# Patient Record
Sex: Female | Born: 1960
Health system: Southern US, Community
[De-identification: ages and names within clinical notes are randomized; demographics above are authoritative.]

## PROBLEM LIST (undated history)

## (undated) DIAGNOSIS — J449 Chronic obstructive pulmonary disease, unspecified: Secondary | ICD-10-CM

## (undated) DIAGNOSIS — I10 Essential (primary) hypertension: Secondary | ICD-10-CM

## (undated) DIAGNOSIS — G4730 Sleep apnea, unspecified: Secondary | ICD-10-CM

## (undated) DIAGNOSIS — J45909 Unspecified asthma, uncomplicated: Secondary | ICD-10-CM

## (undated) DIAGNOSIS — M1990 Unspecified osteoarthritis, unspecified site: Secondary | ICD-10-CM

## (undated) HISTORY — PX: OTHER SURGICAL HISTORY: SHX169

## (undated) HISTORY — DX: Unspecified osteoarthritis, unspecified site: M19.90

---

## 2002-10-30 ENCOUNTER — Emergency Department (HOSPITAL_COMMUNITY): Admission: EM | Admit: 2002-10-30 | Discharge: 2002-10-30 | Payer: Self-pay | Admitting: Emergency Medicine

## 2003-06-06 ENCOUNTER — Emergency Department (HOSPITAL_COMMUNITY): Admission: EM | Admit: 2003-06-06 | Discharge: 2003-06-06 | Payer: Self-pay | Admitting: Emergency Medicine

## 2003-08-03 ENCOUNTER — Emergency Department (HOSPITAL_COMMUNITY): Admission: EM | Admit: 2003-08-03 | Discharge: 2003-08-04 | Payer: Self-pay | Admitting: Emergency Medicine

## 2003-12-29 ENCOUNTER — Emergency Department (HOSPITAL_COMMUNITY): Admission: EM | Admit: 2003-12-29 | Discharge: 2003-12-29 | Payer: Self-pay | Admitting: Emergency Medicine

## 2005-11-03 ENCOUNTER — Emergency Department (HOSPITAL_COMMUNITY): Admission: EM | Admit: 2005-11-03 | Discharge: 2005-11-03 | Payer: Self-pay | Admitting: *Deleted

## 2007-01-07 ENCOUNTER — Emergency Department (HOSPITAL_COMMUNITY): Admission: EM | Admit: 2007-01-07 | Discharge: 2007-01-07 | Payer: Self-pay | Admitting: Emergency Medicine

## 2007-01-08 ENCOUNTER — Emergency Department (HOSPITAL_COMMUNITY): Admission: EM | Admit: 2007-01-08 | Discharge: 2007-01-08 | Payer: Self-pay | Admitting: Emergency Medicine

## 2010-04-20 ENCOUNTER — Ambulatory Visit: Payer: Self-pay | Admitting: Family Medicine

## 2010-04-20 ENCOUNTER — Encounter (INDEPENDENT_AMBULATORY_CARE_PROVIDER_SITE_OTHER): Payer: Self-pay | Admitting: Family Medicine

## 2010-04-20 LAB — CONVERTED CEMR LAB
ALT: <8 units/L (ref 0–35)
AST: 12 units/L (ref 0–37)
Albumin: 4.5 g/dL (ref 3.5–5.2)
Alkaline Phosphatase: 50 units/L (ref 39–117)
BUN: 10 mg/dL (ref 6–23)
Basophils Absolute: 0 10*3/uL (ref 0.0–0.1)
Basophils Relative: 1 % (ref 0–1)
CO2: 28 meq/L (ref 19–32)
Calcium: 9 mg/dL (ref 8.4–10.5)
Chloride: 107 meq/L (ref 96–112)
Cholesterol: 187 mg/dL (ref 0–200)
Creatinine, Ser: 0.85 mg/dL (ref 0.40–1.20)
Eosinophils Absolute: 0 10*3/uL (ref 0.0–0.7)
Eosinophils Relative: 1 % (ref 0–5)
Glucose, Bld: 84 mg/dL (ref 70–99)
HCT: 46.5 % — ABNORMAL HIGH (ref 36.0–46.0)
HDL: 57 mg/dL (ref >39)
Helicobacter Pylori Antibody-IgG: 0.7
Hemoglobin: 15.4 g/dL — ABNORMAL HIGH (ref 12.0–15.0)
LDL Cholesterol: 114 mg/dL — ABNORMAL HIGH (ref 0–99)
Lymphocytes Relative: 54 % — ABNORMAL HIGH (ref 12–46)
Lymphs Abs: 2.1 10*3/uL (ref 0.7–4.0)
MCHC: 33.1 g/dL (ref 30.0–36.0)
MCV: 93.6 fL (ref 78.0–100.0)
Monocytes Absolute: 0.4 10*3/uL (ref 0.1–1.0)
Monocytes Relative: 11 % (ref 3–12)
Neutro Abs: 1.3 10*3/uL — ABNORMAL LOW (ref 1.7–7.7)
Neutrophils Relative %: 33 % — ABNORMAL LOW (ref 43–77)
Platelets: 252 10*3/uL (ref 150–400)
Potassium: 3.8 meq/L (ref 3.5–5.3)
RBC: 4.97 M/uL (ref 3.87–5.11)
RDW: 13.6 % (ref 11.5–15.5)
Sodium: 140 meq/L (ref 135–145)
TSH: 0.739 microintl units/mL (ref 0.350–4.500)
Total Bilirubin: 0.4 mg/dL (ref 0.3–1.2)
Total CHOL/HDL Ratio: 3.3
Total Protein: 7.5 g/dL (ref 6.0–8.3)
Triglycerides: 80 mg/dL (ref <150)
VLDL: 16 mg/dL (ref 0–40)
WBC: 3.9 10*3/uL — ABNORMAL LOW (ref 4.0–10.5)

## 2010-05-08 ENCOUNTER — Ambulatory Visit (HOSPITAL_COMMUNITY): Admission: RE | Admit: 2010-05-08 | Discharge: 2010-05-08 | Payer: Self-pay | Admitting: Internal Medicine

## 2010-05-10 ENCOUNTER — Ambulatory Visit (HOSPITAL_COMMUNITY): Admission: RE | Admit: 2010-05-10 | Discharge: 2010-05-10 | Payer: Self-pay | Admitting: Internal Medicine

## 2010-05-20 ENCOUNTER — Encounter: Admission: RE | Admit: 2010-05-20 | Discharge: 2010-05-20 | Payer: Self-pay | Admitting: Internal Medicine

## 2011-06-21 ENCOUNTER — Emergency Department (HOSPITAL_COMMUNITY)
Admission: EM | Admit: 2011-06-21 | Discharge: 2011-06-21 | Discharge status: Against Medical Advice | Payer: Self-pay | Attending: Emergency Medicine | Admitting: Emergency Medicine

## 2011-06-21 ENCOUNTER — Emergency Department (HOSPITAL_COMMUNITY): Payer: Self-pay

## 2011-06-21 DIAGNOSIS — Z0389 Encounter for observation for other suspected diseases and conditions ruled out: Secondary | ICD-10-CM | POA: Insufficient documentation

## 2011-06-27 ENCOUNTER — Emergency Department (HOSPITAL_COMMUNITY)
Admission: EM | Admit: 2011-06-27 | Discharge: 2011-06-27 | Disposition: A | Discharge status: Home / Self Care | Payer: Self-pay | Attending: Emergency Medicine | Admitting: Emergency Medicine

## 2011-06-27 ENCOUNTER — Emergency Department (HOSPITAL_COMMUNITY): Payer: Self-pay

## 2011-06-27 DIAGNOSIS — J40 Bronchitis, not specified as acute or chronic: Secondary | ICD-10-CM | POA: Insufficient documentation

## 2011-06-27 DIAGNOSIS — R079 Chest pain, unspecified: Secondary | ICD-10-CM | POA: Insufficient documentation

## 2011-06-27 DIAGNOSIS — R0602 Shortness of breath: Secondary | ICD-10-CM | POA: Insufficient documentation

## 2011-06-27 DIAGNOSIS — R062 Wheezing: Secondary | ICD-10-CM | POA: Insufficient documentation

## 2011-06-27 DIAGNOSIS — R509 Fever, unspecified: Secondary | ICD-10-CM | POA: Insufficient documentation

## 2011-06-27 LAB — DIFFERENTIAL
Basophils Absolute: 0 10*3/uL (ref 0.0–0.1)
Basophils Relative: 0 % (ref 0–1)
Eosinophils Absolute: 0 10*3/uL (ref 0.0–0.7)
Eosinophils Relative: 0 % (ref 0–5)
Lymphocytes Relative: 17 % (ref 12–46)
Lymphs Abs: 2.1 10*3/uL (ref 0.7–4.0)
Monocytes Absolute: 0.9 10*3/uL (ref 0.1–1.0)
Monocytes Relative: 7 % (ref 3–12)
Neutro Abs: 9.7 10*3/uL — ABNORMAL HIGH (ref 1.7–7.7)
Neutrophils Relative %: 76 % (ref 43–77)

## 2011-06-27 LAB — BASIC METABOLIC PANEL
BUN: 8 mg/dL (ref 6–23)
CO2: 27 mEq/L (ref 19–32)
Calcium: 9.6 mg/dL (ref 8.4–10.5)
Chloride: 100 mEq/L (ref 96–112)
Creatinine, Ser: 0.72 mg/dL (ref 0.50–1.10)
GFR calc Af Amer: >90 mL/min (ref >90)
GFR calc non Af Amer: >90 mL/min (ref >90)
Glucose, Bld: 95 mg/dL (ref 70–99)
Potassium: 3.4 mEq/L — ABNORMAL LOW (ref 3.5–5.1)
Sodium: 136 mEq/L (ref 135–145)

## 2011-06-27 LAB — CBC
HCT: 45.5 % (ref 36.0–46.0)
Hemoglobin: 15.5 g/dL — ABNORMAL HIGH (ref 12.0–15.0)
MCH: 31.1 pg (ref 26.0–34.0)
MCHC: 34.1 g/dL (ref 30.0–36.0)
MCV: 91.4 fL (ref 78.0–100.0)
Platelets: 220 10*3/uL (ref 150–400)
RBC: 4.98 MIL/uL (ref 3.87–5.11)
RDW: 13.4 % (ref 11.5–15.5)
WBC: 12.7 10*3/uL — ABNORMAL HIGH (ref 4.0–10.5)

## 2012-08-30 ENCOUNTER — Emergency Department (HOSPITAL_COMMUNITY)
Admission: EM | Admit: 2012-08-30 | Discharge: 2012-08-30 | Discharge status: Absent without leave | Payer: Self-pay | Source: Home / Self Care

## 2012-09-06 ENCOUNTER — Encounter (HOSPITAL_COMMUNITY): Payer: Self-pay | Admitting: *Deleted

## 2012-09-06 ENCOUNTER — Emergency Department (INDEPENDENT_AMBULATORY_CARE_PROVIDER_SITE_OTHER)
Admission: EM | Admit: 2012-09-06 | Discharge: 2012-09-06 | Disposition: A | Discharge status: Home / Self Care | Payer: Self-pay | Source: Home / Self Care

## 2012-09-06 DIAGNOSIS — I10 Essential (primary) hypertension: Secondary | ICD-10-CM

## 2012-09-06 DIAGNOSIS — Z720 Tobacco use: Secondary | ICD-10-CM

## 2012-09-06 DIAGNOSIS — F17200 Nicotine dependence, unspecified, uncomplicated: Secondary | ICD-10-CM

## 2012-09-06 DIAGNOSIS — J45909 Unspecified asthma, uncomplicated: Secondary | ICD-10-CM

## 2012-09-06 HISTORY — DX: Unspecified asthma, uncomplicated: J45.909

## 2012-09-06 HISTORY — DX: Essential (primary) hypertension: I10

## 2012-09-06 MED ORDER — LISINOPRIL-HYDROCHLOROTHIAZIDE 10-12.5 MG PO TABS: 1.0000 | ORAL_TABLET | Freq: Every day | ORAL | Status: DC

## 2012-09-06 NOTE — ED Provider Notes (Signed)
History     CSN: [id number]  Arrival date & time 09/06/12  1132   First MD Initiated Contact with Patient 09/06/12 1301      Chief Complaint  Patient presents with  . Hypertension    (Consider location/radiation/quality/duration/timing/severity/associated sxs/prior treatment) HPI Comments: 52 year old female with a history of hypertension, known since October of 2013, but has not been taking her medicine. She was a previous patient at [facility] a did not go back because she did not like the doctor. She also has a history of asthma and is using her sister's albuterol. She states that she was selling her blood at the plasma center today and was told that her blood pressure continued to be too high to give blood. Advised her to come to the urgent care. She is asymptomatic in regards to hypertension. She does not have chest pain or shortness of breath. Denies headache or edema. She does have other complaints related to postmenopausal syndrome and GERD.   Past Medical History  Diagnosis Date  . Hypertension   . Asthma     History reviewed. No pertinent past surgical history.  History reviewed. No pertinent family history.  History  Substance Use Topics  . Smoking status: Current Every Day Smoker  . Smokeless tobacco: Not on file  . Alcohol Use: No    OB History    Grav Para Term Preterm Abortions TAB SAB Ect Mult Living                  Review of Systems  Constitutional: Negative for fever, chills and fatigue.  HENT: Negative.   Respiratory: Negative for chest tightness and shortness of breath.   Cardiovascular:       Complains of a sharp chest pain that comes and goes it is located right parasternal rib.  Gastrointestinal: Positive for nausea.       Reflux symptoms  Skin: Negative.   Neurological: Negative.     Allergies  Review of patient's allergies indicates not on file.  Home Medications   Current Outpatient Rx  Name  Route  Sig  Dispense  Refill  .  LISINOPRIL-HYDROCHLOROTHIAZIDE 10-12.5 MG PO TABS   Oral   Take 1 tablet by mouth daily.   30 tablet   0     BP 169/115  Pulse 64  Temp 98.2 F (36.8 C) (Oral)  Resp 20  SpO2 100%  LMP 09/06/2012  Physical Exam  Nursing note and vitals reviewed. Constitutional: She is oriented to person, place, and time. She appears well-developed and well-nourished. No distress.  HENT:       Bilateral TMs are normal Oropharynx is clear without exudate  Neck: Normal range of motion. Neck supple.  Cardiovascular: Normal rate, regular rhythm and normal heart sounds.   Pulmonary/Chest: Effort normal.       Faint and expiratory wheezes  Abdominal: Soft. There is no tenderness.  Musculoskeletal: Normal range of motion. She exhibits no edema and no tenderness.  Lymphadenopathy:    She has no cervical adenopathy.  Neurological: She is alert and oriented to person, place, and time. She exhibits normal muscle tone.  Skin: Skin is warm and dry. No rash noted.  Psychiatric: She has a normal mood and affect.    ED Course  Procedures (including critical care time)  Labs Reviewed - No data to display No results found.   1. HTN (hypertension)   2. Asthma   3. Tobacco abuse disorder       MDM  We did discuss the importance for her to have a primary care provider. She was given the number for the adult center here. She is in the process of obtaining her orange card. She has several nonurgent issues she needs to discuss with her physician. Although she is asymptomatic in regards to her blood pressure did start a low dose of Zestoretic 10/12.5 prior to her seeing her physician. She is instructed not to smoke and to use her albuterol every 4 hours when necessary Her chest discomfort is chest wall pain specifically costochondritis. She may place ice over the area and take Tylenol as needed.          Hayden Rasmussen, NP 09/06/12 1410

## 2012-09-06 NOTE — ED Notes (Signed)
Pt  Was  Told  Her  bp  Was  High  At  [facility]        She  Has  History  Of  htn             And  Asthma           She takes  No  meds  And  Has no  Doctor   She     Continues  To   Smoke                       She ambulated to room  With a  Steady   Fluid gait         She  Appears  In no  Distress  Talking on  Her  Cell phone

## 2012-09-06 NOTE — ED Notes (Signed)
Pt called in all waiting areas; no answer x 1 

## 2012-09-11 NOTE — ED Provider Notes (Signed)
Medical screening examination/treatment/procedure(s) were performed by resident physician or non-physician practitioner and as supervising physician I was immediately available for consultation/collaboration.   Barkley Bruns MD.    Linna Hoff, MD 09/11/12 [phone]

## 2012-10-19 ENCOUNTER — Emergency Department (HOSPITAL_COMMUNITY)
Admission: EM | Admit: 2012-10-19 | Discharge: 2012-10-19 | Disposition: A | Discharge status: Home / Self Care | Payer: No Typology Code available for payment source | Source: Home / Self Care

## 2012-10-19 ENCOUNTER — Encounter (HOSPITAL_COMMUNITY): Payer: Self-pay | Admitting: *Deleted

## 2012-10-19 DIAGNOSIS — K219 Gastro-esophageal reflux disease without esophagitis: Secondary | ICD-10-CM

## 2012-10-19 DIAGNOSIS — F17200 Nicotine dependence, unspecified, uncomplicated: Secondary | ICD-10-CM

## 2012-10-19 DIAGNOSIS — I10 Essential (primary) hypertension: Secondary | ICD-10-CM

## 2012-10-19 DIAGNOSIS — Z720 Tobacco use: Secondary | ICD-10-CM

## 2012-10-19 MED ORDER — OMEPRAZOLE 20 MG PO CPDR: 20.0000 mg | DELAYED_RELEASE_CAPSULE | Freq: Every day | ORAL | Status: DC

## 2012-10-19 MED ORDER — LISINOPRIL-HYDROCHLOROTHIAZIDE 10-12.5 MG PO TABS: 1.0000 | ORAL_TABLET | Freq: Every day | ORAL | Status: DC

## 2012-10-19 NOTE — ED Provider Notes (Signed)
History     CSN: [id number]  Arrival date & time 10/19/12  1043   First MD Initiated Contact with Patient 10/19/12 1106      Chief Complaint  Patient presents with  . Follow-up    (Consider location/radiation/quality/duration/timing/severity/associated sxs/prior treatment) HPI 52 year old female with history of hypertension, GERD, ?asthma who was seen in the urgent care for elevated blood pressure one month back and was prescribed lisinopril-HCTZ ( 10-12.5mg ), returns for medication refill. She has run out of her blood pressure medication for past one week. She continued to smoke 2-3 cigarettes every day. He does have some epigastric discomfort with acid reflux symptoms and did have relief with Prilosec OTC and prescribed previously. She denies any chest pain, palpitations, headache, dizziness, blurry vision, shortness of breath, wheezing,  nausea, vomiting, bowel or urinary symptoms. Denies any change in weight or appetite.  Past Medical History  Diagnosis Date  . Hypertension   . Asthma     History reviewed. No pertinent past surgical history.  Family History  Problem Relation Age of Onset  . Hypertension Father   . Cancer Other   . Diabetes Other   . Hyperlipidemia Other   . Stroke Other     History  Substance Use Topics  . Smoking status: Current Every Day Smoker -- 0.50 packs/day    Types: Cigarettes  . Smokeless tobacco: Not on file  . Alcohol Use: No    OB History   Grav Para Term Preterm Abortions TAB SAB Ect Mult Living   2         2      Review of Systems  Allergies  Percocet  Home Medications   Current Outpatient Rx  Name  Route  Sig  Dispense  Refill  . lisinopril-hydrochlorothiazide (ZESTORETIC) 10-12.5 MG per tablet   Oral   Take 1 tablet by mouth daily.   30 tablet   0     BP 148/90  Pulse 74  Temp(Src) 98.3 F (36.8 C) (Oral)  Resp 18  SpO2 100%  LMP 08/18/2012  Physical Exam Middle-aged female in no acute distress HEENT: No  pallor, moist oral mucosa Chest: Clear to auscultation bilaterally no added sounds abdomen: Soft, nontender, nondistended, bowel sounds present CVS: Normal S1 and S2, no murmurs rub or gallop Abdomen: Soft, nontender, nondistended, bowel sounds present Extremities: Warm, no edema CNS: AAO x3  ED Course  Procedures (including critical care time)  Labs Reviewed - No data to display No results found.   No diagnosis found.  Assessment/plan  Hypertension Patient has not taken her blood pressure medication for one week as she ran out of it. I will refill her prescription. Counseled her to adhere to diet low in sodium content and encouraged to quitting smoking. Also counseled her on medication adherence  GERD Will prescribe her with course of Prilosec 20 mg daily  MDM  Follow up in 3 months. Ordered for BMET,  hemoglobin A1c and lipid panel prior to the visit         Eddie North, MD 10/19/12 1147

## 2012-10-19 NOTE — ED Notes (Signed)
Pt here to establish care and follow up with htn - has been told that bp is high by [facility] when trying to donate plasma - states that she had tension headaches in back of neck - no other symptoms

## 2013-02-28 ENCOUNTER — Ambulatory Visit: Payer: No Typology Code available for payment source

## 2013-03-08 ENCOUNTER — Emergency Department (HOSPITAL_COMMUNITY): Payer: No Typology Code available for payment source

## 2013-03-08 ENCOUNTER — Encounter (HOSPITAL_COMMUNITY): Payer: Self-pay | Admitting: *Deleted

## 2013-03-08 ENCOUNTER — Emergency Department (HOSPITAL_COMMUNITY)
Admission: EM | Admit: 2013-03-08 | Discharge: 2013-03-08 | Disposition: A | Discharge status: Home / Self Care | Payer: No Typology Code available for payment source | Attending: Emergency Medicine | Admitting: Emergency Medicine

## 2013-03-08 DIAGNOSIS — R059 Cough, unspecified: Secondary | ICD-10-CM | POA: Insufficient documentation

## 2013-03-08 DIAGNOSIS — I10 Essential (primary) hypertension: Secondary | ICD-10-CM | POA: Insufficient documentation

## 2013-03-08 DIAGNOSIS — Z79899 Other long term (current) drug therapy: Secondary | ICD-10-CM | POA: Insufficient documentation

## 2013-03-08 DIAGNOSIS — F17200 Nicotine dependence, unspecified, uncomplicated: Secondary | ICD-10-CM | POA: Insufficient documentation

## 2013-03-08 DIAGNOSIS — J45901 Unspecified asthma with (acute) exacerbation: Secondary | ICD-10-CM | POA: Insufficient documentation

## 2013-03-08 DIAGNOSIS — M7989 Other specified soft tissue disorders: Secondary | ICD-10-CM | POA: Insufficient documentation

## 2013-03-08 DIAGNOSIS — R05 Cough: Secondary | ICD-10-CM | POA: Insufficient documentation

## 2013-03-08 DIAGNOSIS — J40 Bronchitis, not specified as acute or chronic: Secondary | ICD-10-CM

## 2013-03-08 LAB — POCT I-STAT, CHEM 8
BUN: 11 mg/dL (ref 6–23)
Calcium, Ion: 1.14 mmol/L (ref 1.12–1.23)
Chloride: 104 mEq/L (ref 96–112)
Creatinine, Ser: 1 mg/dL (ref 0.50–1.10)
Glucose, Bld: 93 mg/dL (ref 70–99)
HCT: 44 % (ref 36.0–46.0)
Hemoglobin: 15 g/dL (ref 12.0–15.0)
Potassium: 4.3 mEq/L (ref 3.5–5.1)
Sodium: 145 mEq/L (ref 135–145)
TCO2: 28 mmol/L (ref 0–100)

## 2013-03-08 LAB — CBC WITH DIFFERENTIAL/PLATELET
Basophils Absolute: 0 10*3/uL (ref 0.0–0.1)
Basophils Relative: 0 % (ref 0–1)
Eosinophils Absolute: 0.1 10*3/uL (ref 0.0–0.7)
Eosinophils Relative: 2 % (ref 0–5)
HCT: 44.2 % (ref 36.0–46.0)
Hemoglobin: 14.5 g/dL (ref 12.0–15.0)
Lymphocytes Relative: 55 % — ABNORMAL HIGH (ref 12–46)
Lymphs Abs: 2.8 10*3/uL (ref 0.7–4.0)
MCH: 30.7 pg (ref 26.0–34.0)
MCHC: 32.8 g/dL (ref 30.0–36.0)
MCV: 93.6 fL (ref 78.0–100.0)
Monocytes Absolute: 0.5 10*3/uL (ref 0.1–1.0)
Monocytes Relative: 10 % (ref 3–12)
Neutro Abs: 1.7 10*3/uL (ref 1.7–7.7)
Neutrophils Relative %: 33 % — ABNORMAL LOW (ref 43–77)
Platelets: 292 10*3/uL (ref 150–400)
RBC: 4.72 MIL/uL (ref 3.87–5.11)
RDW: 13.2 % (ref 11.5–15.5)
WBC: 5.1 10*3/uL (ref 4.0–10.5)

## 2013-03-08 LAB — POCT I-STAT TROPONIN I: Troponin i, poc: 0 ng/mL (ref 0.00–0.08)

## 2013-03-08 MED ORDER — IPRATROPIUM BROMIDE 0.02 % IN SOLN
0.5000 mg | Freq: Once | RESPIRATORY_TRACT | Status: AC
  Administered 2013-03-08: 0.5 mg via RESPIRATORY_TRACT
  Filled 2013-03-08: qty 2.5

## 2013-03-08 MED ORDER — PREDNISONE 10 MG PO TABS: ORAL_TABLET | ORAL | Status: DC

## 2013-03-08 MED ORDER — PREDNISONE 20 MG PO TABS
60.0000 mg | ORAL_TABLET | Freq: Once | ORAL | Status: AC
  Administered 2013-03-08: 60 mg via ORAL
  Filled 2013-03-08: qty 3

## 2013-03-08 MED ORDER — ALBUTEROL SULFATE (5 MG/ML) 0.5% IN NEBU
5.0000 mg | INHALATION_SOLUTION | Freq: Once | RESPIRATORY_TRACT | Status: AC
  Administered 2013-03-08: 5 mg via RESPIRATORY_TRACT
  Filled 2013-03-08: qty 1

## 2013-03-08 MED ORDER — ALBUTEROL SULFATE HFA 108 (90 BASE) MCG/ACT IN AERS
2.0000 | INHALATION_SPRAY | Freq: Once | RESPIRATORY_TRACT | Status: AC
  Administered 2013-03-08: 2 via RESPIRATORY_TRACT
  Filled 2013-03-08: qty 6.7

## 2013-03-08 MED ORDER — BENZONATATE 100 MG PO CAPS: 100.0000 mg | ORAL_CAPSULE | Freq: Three times a day (TID) | ORAL | Status: DC

## 2013-03-08 NOTE — ED Notes (Signed)
Pt states yesterday started having shortness of breath, states at times she feels like her throat is closing up and she can't catch her breath, pt states she has run out of her inhaler, pt states "I've been told I have asthma but I dunno what I have".

## 2013-03-08 NOTE — ED Provider Notes (Signed)
History  This chart was scribed for Jaynie Crumble, PA-C working with Juliet Rude. Rubin Payor, MD by Greggory Stallion, ED scribe. This patient was seen in room WTR9/WTR9 and the patient's care was started at 8:45 PM.  CSN: [id number] Arrival date & time 03/08/13  2021   Chief Complaint  Patient presents with  . Shortness of Breath  . Cough  . Asthma   The history is provided by the patient. No language interpreter was used.    HPI Comments: Amber Shaw is a 52 y.o. female who presents to the Emergency Department complaining of sudden onset, constant SOB with associated cough that started yesterday. Pt states she normally uses her inhaler when this happens but she doesn't have one right now because she ran out. Pt states walking makes it worse. She states she may have some mild leg swelling. Pt denies any other associated symptoms. Pt states she smokes a few cigarettes every now and then.   Past Medical History  Diagnosis Date  . Hypertension   . Asthma    History reviewed. No pertinent past surgical history. Family History  Problem Relation Age of Onset  . Hypertension Father   . Cancer Other   . Diabetes Other   . Hyperlipidemia Other   . Stroke Other    History  Substance Use Topics  . Smoking status: Current Every Day Smoker -- 0.50 packs/day    Types: Cigarettes  . Smokeless tobacco: Not on file  . Alcohol Use: No   OB History   Grav Para Term Preterm Abortions TAB SAB Ect Mult Living   2         2     Review of Systems  Cardiovascular: Positive for leg swelling.  All other systems reviewed and are negative.    Allergies  Percocet  Home Medications   Current Outpatient Rx  Name  Route  Sig  Dispense  Refill  . lisinopril-hydrochlorothiazide (ZESTORETIC) 10-12.5 MG per tablet   Oral   Take 1 tablet by mouth daily.   30 tablet   3   . omeprazole (PRILOSEC) 20 MG capsule   Oral   Take 1 capsule (20 mg total) by mouth daily.   30 capsule   0    BP  123/81  Pulse 80  Temp(Src) 98.1 F (36.7 C) (Oral)  Resp 20  Ht 5\' 5"  (1.651 m)  Wt 167 lb (75.751 kg)  BMI 27.79 kg/m2  SpO2 97%  LMP 01/06/2013  Physical Exam  Nursing note and vitals reviewed. Constitutional: She is oriented to person, place, and time. She appears well-developed and well-nourished. No distress.  Speaking full sentances.   HENT:  Head: Normocephalic and atraumatic.  Eyes: Conjunctivae and EOM are normal.  Neck: Normal range of motion. Neck supple. No tracheal deviation present.  Cardiovascular: Normal rate, regular rhythm and normal heart sounds.   No murmur heard. Radial pulses intact.  Pulmonary/Chest: Effort normal. No respiratory distress.  Decreased air movement bilaterally. Bilateral expiratory wheezes.   Musculoskeletal: Normal range of motion.  Trace lower extremitiy edema.  Neurological: She is alert and oriented to person, place, and time.  Skin: Skin is warm and dry.  Psychiatric: She has a normal mood and affect. Her behavior is normal.    ED Course  Procedures (including critical care time)  DIAGNOSTIC STUDIES: Oxygen Saturation is 97% on RA, normal by my interpretation.    COORDINATION OF CARE: 8:49 PM-Discussed treatment plan which includes breathing treatment and chest xray with  pt at bedside and pt agreed to plan.   Results for orders placed during the hospital encounter of 03/08/13  CBC WITH DIFFERENTIAL      Result Value Range   WBC 5.1  4.0 - 10.5 K/uL   RBC 4.72  3.87 - 5.11 MIL/uL   Hemoglobin 14.5  12.0 - 15.0 g/dL   HCT 40.9  81.1 - 91.4 %   MCV 93.6  78.0 - 100.0 fL   MCH 30.7  26.0 - 34.0 pg   MCHC 32.8  30.0 - 36.0 g/dL   RDW 78.2  95.6 - 21.3 %   Platelets 292  150 - 400 K/uL   Neutrophils Relative % 33 (*) 43 - 77 %   Neutro Abs 1.7  1.7 - 7.7 K/uL   Lymphocytes Relative 55 (*) 12 - 46 %   Lymphs Abs 2.8  0.7 - 4.0 K/uL   Monocytes Relative 10  3 - 12 %   Monocytes Absolute 0.5  0.1 - 1.0 K/uL   Eosinophils  Relative 2  0 - 5 %   Eosinophils Absolute 0.1  0.0 - 0.7 K/uL   Basophils Relative 0  0 - 1 %   Basophils Absolute 0.0  0.0 - 0.1 K/uL  POCT I-STAT, CHEM 8      Result Value Range   Sodium 145  135 - 145 mEq/L   Potassium 4.3  3.5 - 5.1 mEq/L   Chloride 104  96 - 112 mEq/L   BUN 11  6 - 23 mg/dL   Creatinine, Ser 0.86  0.50 - 1.10 mg/dL   Glucose, Bld 93  70 - 99 mg/dL   Calcium, Ion 5.78  4.69 - 1.23 mmol/L   TCO2 28  0 - 100 mmol/L   Hemoglobin 15.0  12.0 - 15.0 g/dL   HCT 62.9  52.8 - 41.3 %  POCT I-STAT TROPONIN I      Result Value Range   Troponin i, poc 0.00  0.00 - 0.08 ng/mL   Comment 3            Dg Chest 2 View  03/08/2013   *RADIOLOGY REPORT*  Clinical Data: Shortness of breath and cough.  CHEST - 2 VIEW  Comparison: 06/27/2011  Findings: Two views of the chest demonstrate stable densities at the left lung base.  No focal airspace disease or edema. Heart and mediastinum are within normal limits.  The trachea is midline. Mild degenerative changes in the thoracic spine.  IMPRESSION: No acute chest findings.   Original Report Authenticated By: Richarda Overlie, M.D.     Date: 03/08/2013  Rate: 78  Rhythm: normal sinus rhythm and premature ventricular contractions (PVC)  QRS Axis: normal  Intervals: PR shortened  ST/T Wave abnormalities: normal  Conduction Disutrbances:none  Narrative Interpretation:   Old EKG Reviewed: none available   1. Bronchitis     MDM  Pt with wheezing, cough. VS normal. Pt received 2 nebs in ED, prednisone 40mg . CXR negative. Labs unremarkable. Doubt PE, not tachycardic, tachypnec, hypoxic. Pt is a smoker. Pt feeling much better after nebs and prednisone. Will start on short prednisone course, inhaler given. Stop smoking. Follow up with pcp.   Filed Vitals:   03/08/13 2044 03/08/13 2312  BP: 123/81 119/62  Pulse: 80 85  Temp: 98.1 F (36.7 C)   TempSrc: Oral   Resp: 20 20  Height: 5\' 5"  (1.651 m)   Weight: 167 lb (75.751 kg)   SpO2: 97%  95%  I personally performed the services described in this documentation, which was scribed in my presence. The recorded information has been reviewed and is accurate. }   Lottie Mussel, PA-C 03/08/13 2350

## 2013-03-08 NOTE — ED Notes (Signed)
RT called for nebulizer tx

## 2013-03-09 NOTE — ED Provider Notes (Signed)
Medical screening examination/treatment/procedure(s) were performed by non-physician practitioner and as supervising physician I was immediately available for consultation/collaboration.  Juliet Rude. Rubin Payor, MD 03/09/13 9629

## 2013-04-21 ENCOUNTER — Ambulatory Visit: Payer: Self-pay | Attending: Internal Medicine | Admitting: Internal Medicine

## 2013-04-21 ENCOUNTER — Encounter: Payer: Self-pay | Admitting: Internal Medicine

## 2013-04-21 VITALS — BP 176/98 | HR 81 | Temp 98.8°F | Resp 16 | Ht 65.0 in | Wt 181.0 lb

## 2013-04-21 DIAGNOSIS — F17200 Nicotine dependence, unspecified, uncomplicated: Secondary | ICD-10-CM | POA: Insufficient documentation

## 2013-04-21 DIAGNOSIS — J42 Unspecified chronic bronchitis: Secondary | ICD-10-CM | POA: Insufficient documentation

## 2013-04-21 DIAGNOSIS — I10 Essential (primary) hypertension: Secondary | ICD-10-CM | POA: Insufficient documentation

## 2013-04-21 LAB — CBC WITH DIFFERENTIAL/PLATELET
Eosinophils Absolute: 0.1 10*3/uL (ref 0.0–0.7)
Eosinophils Relative: 4 % (ref 0–5)
Hemoglobin: 14.5 g/dL (ref 12.0–15.0)
Lymphocytes Relative: 52 % — ABNORMAL HIGH (ref 12–46)
Lymphs Abs: 1.6 10*3/uL (ref 0.7–4.0)
MCH: 31.4 pg (ref 26.0–34.0)
MCV: 93.3 fL (ref 78.0–100.0)
Monocytes Relative: 10 % (ref 3–12)
Platelets: 259 10*3/uL (ref 150–400)
RBC: 4.62 MIL/uL (ref 3.87–5.11)
WBC: 3 10*3/uL — ABNORMAL LOW (ref 4.0–10.5)

## 2013-04-21 LAB — CMP AND LIVER
ALT: 14 U/L (ref 0–35)
AST: 16 U/L (ref 0–37)
Alkaline Phosphatase: 56 U/L (ref 39–117)
BUN: 13 mg/dL (ref 6–23)
Chloride: 106 mEq/L (ref 96–112)
Creat: 0.74 mg/dL (ref 0.50–1.10)
Total Bilirubin: 0.4 mg/dL (ref 0.3–1.2)

## 2013-04-21 LAB — LIPID PANEL
Cholesterol: 202 mg/dL — ABNORMAL HIGH (ref 0–200)
Total CHOL/HDL Ratio: 2.8 Ratio
VLDL: 13 mg/dL (ref 0–40)

## 2013-04-21 MED ORDER — ALBUTEROL SULFATE HFA 108 (90 BASE) MCG/ACT IN AERS: 2.0000 | INHALATION_SPRAY | Freq: Four times a day (QID) | RESPIRATORY_TRACT | Status: DC | PRN

## 2013-04-21 MED ORDER — LISINOPRIL-HYDROCHLOROTHIAZIDE 10-12.5 MG PO TABS: 1.0000 | ORAL_TABLET | Freq: Every day | ORAL | Status: DC

## 2013-04-21 NOTE — Progress Notes (Signed)
Pt here to establish care Hx HTN,BRONCHITIS NEEDS PCP C/O SOB- SATS 99

## 2013-04-21 NOTE — Progress Notes (Signed)
Patient ID: Amber Shaw, female   DOB: [birth year], 52 y.o.   MRN: [id number]  CC: To establish care  HPI: Amber Shaw is a 52 years old African American woman came into clinic today to establish medical care. She has history of hypertension and asthma with bronchitis. She is out of her medication for more than a month, she has no complaint today. No headache no chest pain. There is family history of breast cancer in mother who died at age of 76 from complications and metastasis, father had a heart murmur and the heart disease unknown to patient. She smokes cigarettes, now down to 3 cigarettes per day and one pack per day, denies the use of alcohol. Allergies  Allergen Reactions  . Percocet [Oxycodone-Acetaminophen] Other (See Comments)    Painful urination    Past Medical History  Diagnosis Date  . Hypertension   . Asthma    Current Outpatient Prescriptions on File Prior to Visit  Medication Sig Dispense Refill  . benzonatate (TESSALON) 100 MG capsule Take 1 capsule (100 mg total) by mouth every 8 (eight) hours.  21 capsule  0  . predniSONE (DELTASONE) 10 MG tablet Take 5 tab day 1, take 4 tab day 2, take 3 tab day 3, take 2 tab day 4, and take 1 tab day 5  15 tablet  0   No current facility-administered medications on file prior to visit.   Family History  Problem Relation Age of Onset  . Hypertension Father   . Cancer Other   . Diabetes Other   . Hyperlipidemia Other   . Stroke Other    History   Social History  . Marital Status: Single    Spouse Name: N/A    Number of Children: N/A  . Years of Education: N/A   Occupational History  . Not on file.   Social History Main Topics  . Smoking status: Current Every Day Smoker -- 0.50 packs/day    Types: Cigarettes  . Smokeless tobacco: Not on file  . Alcohol Use: No  . Drug Use: Not on file  . Sexual Activity: Not on file   Other Topics Concern  . Not on file   Social History Narrative  . No narrative on file     Review of Systems: Constitutional: Negative for fever, chills, diaphoresis, activity change, appetite change and fatigue. HENT: Negative for ear pain, nosebleeds, congestion, facial swelling, rhinorrhea, neck pain, neck stiffness and ear discharge.  Eyes: Negative for pain, discharge, redness, itching and visual disturbance. Respiratory: Negative for cough, choking, chest tightness, shortness of breath, wheezing and stridor.  Cardiovascular: Negative for chest pain, palpitations and leg swelling. Gastrointestinal: Negative for abdominal distention. Genitourinary: Negative for dysuria, urgency, frequency, hematuria, flank pain, decreased urine volume, difficulty urinating and dyspareunia.  Musculoskeletal: Negative for back pain, joint swelling, arthralgias and gait problem. Neurological: Negative for dizziness, tremors, seizures, syncope, facial asymmetry, speech difficulty, weakness, light-headedness, numbness and headaches.  Hematological: Negative for adenopathy. Does not bruise/bleed easily. Psychiatric/Behavioral: Negative for hallucinations, behavioral problems, confusion, dysphoric mood, decreased concentration and agitation.    Objective:   Filed Vitals:   04/21/13 0919  BP: 176/98  Pulse: 81  Temp: 98.8 F (37.1 C)  Resp: 16    Physical Exam: Constitutional: Patient appears well-developed and well-nourished. No distress. HENT: Normocephalic, atraumatic, External right and left ear normal. Oropharynx is clear and moist.  Eyes: Conjunctivae and EOM are normal. PERRLA, no scleral icterus. Neck: Normal ROM. Neck supple. No JVD. No  tracheal deviation. No thyromegaly. CVS: RRR, S1/S2 +, no murmurs, no gallops, no carotid bruit.  Pulmonary: Effort and breath sounds normal, no stridor, rhonchi, wheezes, rales.  Abdominal: Soft. BS +,  no distension, tenderness, rebound or guarding.  Musculoskeletal: Normal range of motion. No edema and no tenderness.  Lymphadenopathy: No  lymphadenopathy noted, cervical, inguinal or axillary Neuro: Alert. Normal reflexes, muscle tone coordination. No cranial nerve deficit. Skin: Skin is warm and dry. No rash noted. Not diaphoretic. No erythema. No pallor. Psychiatric: Normal mood and affect. Behavior, judgment, thought content normal.  Lab Results  Component Value Date   WBC 5.1 03/08/2013   HGB 15.0 03/08/2013   HCT 44.0 03/08/2013   MCV 93.6 03/08/2013   PLT 292 03/08/2013   Lab Results  Component Value Date   CREATININE 1.00 03/08/2013   BUN 11 03/08/2013   NA 145 03/08/2013   K 4.3 03/08/2013   CL 104 03/08/2013   CO2 27 06/27/2011    No results found for this basename: HGBA1C   Lipid Panel     Component Value Date/Time   CHOL 187 04/20/2010 1546   TRIG 80 04/20/2010 1546   HDL 57 04/20/2010 1546   CHOLHDL 3.3 Ratio 04/20/2010 1546   VLDL 16 04/20/2010 1546   LDLCALC 114* 04/20/2010 1546       Assessment and plan:   Patient Active Problem List   Diagnosis Date Noted  . Essential hypertension, benign 04/21/2013  . Unspecified chronic bronchitis 04/21/2013  . Nicotine dependence 04/21/2013   Labs today: CBC D. CMP Lipid panel Complete urinalysis Thyroid function test Hemoglobin A1c  Refill lisinopril-hydrochlorothiazide 10-12.5 mg tablet by mouth daily Albuterol inhaler 2 puffs every 6 when necessary shortness of breath  Patient extensively counseled about smoking cessation Patient counseled about nutrition and exercise Patient counseled about medication compliance  Return in 2 weeks to clinic for followup, and Pap smear, and referral for mammogram and colonoscopy  Amber Shaw was given clear instructions to go to ER or return to the clinic if symptoms don't improve, worsen or new problems develop.  Amber Shaw verbalized understanding.  Amber Shaw was told to call to get lab results if hasn't heard anything in the next week.        Jeanann Lewandowsky, MD [facility] [place], [place] [phone]   04/21/2013, 10:08 AM

## 2013-04-22 LAB — URINALYSIS, COMPLETE
Casts: NONE SEEN
Crystals: NONE SEEN
Glucose, UA: NEGATIVE mg/dL
Ketones, ur: NEGATIVE mg/dL
Leukocytes, UA: NEGATIVE
Nitrite: NEGATIVE
Specific Gravity, Urine: 1.014 (ref 1.005–1.030)
pH: 7 (ref 5.0–8.0)

## 2013-04-26 ENCOUNTER — Ambulatory Visit: Payer: Self-pay

## 2013-06-06 ENCOUNTER — Emergency Department (HOSPITAL_COMMUNITY): Payer: Self-pay

## 2013-06-06 ENCOUNTER — Encounter (HOSPITAL_COMMUNITY): Payer: Self-pay | Admitting: Emergency Medicine

## 2013-06-06 ENCOUNTER — Emergency Department (HOSPITAL_COMMUNITY)
Admission: EM | Admit: 2013-06-06 | Discharge: 2013-06-07 | Disposition: A | Discharge status: Home / Self Care | Payer: Self-pay | Attending: Emergency Medicine | Admitting: Emergency Medicine

## 2013-06-06 DIAGNOSIS — J441 Chronic obstructive pulmonary disease with (acute) exacerbation: Secondary | ICD-10-CM | POA: Insufficient documentation

## 2013-06-06 DIAGNOSIS — F17200 Nicotine dependence, unspecified, uncomplicated: Secondary | ICD-10-CM | POA: Insufficient documentation

## 2013-06-06 DIAGNOSIS — I10 Essential (primary) hypertension: Secondary | ICD-10-CM | POA: Insufficient documentation

## 2013-06-06 DIAGNOSIS — Z79899 Other long term (current) drug therapy: Secondary | ICD-10-CM | POA: Insufficient documentation

## 2013-06-06 MED ORDER — ALBUTEROL SULFATE (5 MG/ML) 0.5% IN NEBU
5.0000 mg | INHALATION_SOLUTION | Freq: Once | RESPIRATORY_TRACT | Status: AC
  Administered 2013-06-06: 5 mg via RESPIRATORY_TRACT
  Filled 2013-06-06: qty 1

## 2013-06-06 MED ORDER — IPRATROPIUM BROMIDE 0.02 % IN SOLN
0.5000 mg | Freq: Once | RESPIRATORY_TRACT | Status: AC
  Administered 2013-06-06: 0.5 mg via RESPIRATORY_TRACT
  Filled 2013-06-06: qty 2.5

## 2013-06-06 MED ORDER — PREDNISONE 20 MG PO TABS
60.0000 mg | ORAL_TABLET | Freq: Once | ORAL | Status: AC
  Administered 2013-06-06: 60 mg via ORAL
  Filled 2013-06-06: qty 3

## 2013-06-06 NOTE — ED Notes (Signed)
Pt c/o of sob that started last night with sweating and coughing. Pt speaking in chopping sentences. Saturations at 95%. Pt reports coughing up yellow sputum and chest pressure. VS stable.

## 2013-06-06 NOTE — ED Provider Notes (Signed)
CSN: [id number]     Arrival date & time 06/06/13  1939 History   First MD Initiated Contact with Patient 06/06/13 2123     Chief Complaint  Patient presents with  . Cough  . Shortness of Breath   (Consider location/radiation/quality/duration/timing/severity/associated sxs/prior Treatment) HPI Comments: Patient with a history of COPD presents today with a chief complaint of wheezing, SOB, and productive cough.  She reports that her symptoms began yesterday and are gradually worsening.  She has been using her Albuterol inhaler, which has given her temporary relief.  She reports that she has also taken OTC cough syrup and cough drops, which have helped somewhat.  She states that she smoked 2-3 cigarettes a day, but quit smoking one week ago.  She denies fever or chills.  Denies hemoptysis.  No lower extremity edema.  She reports some tightness in her chest, but denies chest pain.    Patient is a 52 y.o. female presenting with cough and shortness of breath. The history is provided by the patient.  Cough Associated symptoms: shortness of breath and wheezing   Shortness of Breath Associated symptoms: cough and wheezing     Past Medical History  Diagnosis Date  . Hypertension   . Asthma    History reviewed. No pertinent past surgical history. Family History  Problem Relation Age of Onset  . Hypertension Father   . Cancer Other   . Diabetes Other   . Hyperlipidemia Other   . Stroke Other    History  Substance Use Topics  . Smoking status: Current Every Day Smoker -- 0.50 packs/day    Types: Cigarettes  . Smokeless tobacco: Not on file  . Alcohol Use: No   OB History   Grav Para Term Preterm Abortions TAB SAB Ect Mult Living   2         2     Review of Systems  Respiratory: Positive for cough, shortness of breath and wheezing.   All other systems reviewed and are negative.    Allergies  Percocet  Home Medications   Current Outpatient Rx  Name  Route  Sig  Dispense   Refill  . acetaminophen (TYLENOL) 325 MG tablet   Oral   Take 650 mg by mouth every 4 (four) hours as needed for pain (pain).         Marland Kitchen albuterol (PROVENTIL HFA;VENTOLIN HFA) 108 (90 BASE) MCG/ACT inhaler   Inhalation   Inhale 2 puffs into the lungs every 6 (six) hours as needed for wheezing or shortness of breath.   1 Inhaler   2   . DM-Phenylephrine-Acetaminophen (DAY TIME COLD/FLU RELIEF) 10-5-325 MG/15ML LIQD   Oral   Take 30 mLs by mouth 2 (two) times daily as needed (cold and flu-like symptoms).         Marland Kitchen lisinopril-hydrochlorothiazide (ZESTORETIC) 10-12.5 MG per tablet   Oral   Take 1 tablet by mouth daily.   90 tablet   3    BP 151/97  Pulse 79  Temp(Src) 98.9 F (37.2 C) (Oral)  Resp 20  SpO2 99%  LMP 05/16/2013 Physical Exam  Nursing note and vitals reviewed. Constitutional: She appears well-developed and well-nourished.  HENT:  Head: Normocephalic and atraumatic.  Right Ear: Tympanic membrane and ear canal normal.  Left Ear: Tympanic membrane and ear canal normal.  Nose: Nose normal.  Mouth/Throat: Uvula is midline, oropharynx is clear and moist and mucous membranes are normal.  Neck: Normal range of motion. Neck supple.  Cardiovascular:  Normal rate, regular rhythm and normal heart sounds.   Pulmonary/Chest: Effort normal. She has decreased breath sounds. She has wheezes.  Patient speaking in complete sentences  Neurological: She is alert.  Skin: Skin is warm and dry.  Psychiatric: She has a normal mood and affect.    ED Course  Procedures (including critical care time) Labs Review Labs Reviewed - No data to display Imaging Review Dg Chest 2 View  06/06/2013   CLINICAL DATA:  Cough and shortness of breath  EXAM: CHEST  2 VIEW  COMPARISON:  March 08, 2013  FINDINGS: Lungs are clear. Heart size and pulmonary vascularity are normal. No adenopathy. No bone lesions.  IMPRESSION: No edema or consolidation.   Electronically Signed   By: Bretta Bang  M.D.   On: 06/06/2013 20:38    EKG Interpretation   None     11:20 PM Reassessed patient.  She reports that her SOB has improved somewhat.  Lungs continue to have significant diffuse wheezing and decreased breath sounds.  Will order another breathing treatment and reassess.  12:00 AM Reassessed patient.  She reports that her SOB has continued to improve.  Lungs sounds have improved, but patient continues to have wheezing.  12:58 AM Reassessed patient.  She reports that her SOB has improved.  Lungs sounds significantly improved at this time.  Will check pulse ox while ambulating. MDM  No diagnosis found. Patient ambulated in ED with O2 saturations maintained >90, no current signs of respiratory distress. Lung exam improved after nebulizer treatment. Prednisone given in the ED and pt will bd dc with 5 day burst. Pt states they are breathing at baseline. Pt has been instructed to continue using prescribed medications and to speak with PCP about today's exacerbation.  Return precautions given.     Pascal Lux Gordon, PA-C 06/07/13 1135

## 2013-06-07 ENCOUNTER — Telehealth: Payer: Self-pay | Admitting: Internal Medicine

## 2013-06-07 MED ORDER — ACETAMINOPHEN 325 MG PO TABS
650.0000 mg | ORAL_TABLET | Freq: Once | ORAL | Status: AC
  Administered 2013-06-07: 650 mg via ORAL
  Filled 2013-06-07: qty 2

## 2013-06-07 MED ORDER — ALBUTEROL SULFATE (5 MG/ML) 0.5% IN NEBU
INHALATION_SOLUTION | RESPIRATORY_TRACT | Status: AC
  Filled 2013-06-07: qty 0.5

## 2013-06-07 MED ORDER — HYDROCODONE-HOMATROPINE 5-1.5 MG/5ML PO SYRP: 5.0000 mL | ORAL_SOLUTION | Freq: Four times a day (QID) | ORAL | Status: DC | PRN

## 2013-06-07 MED ORDER — BENZONATATE 100 MG PO CAPS: 100.0000 mg | ORAL_CAPSULE | Freq: Three times a day (TID) | ORAL | Status: DC

## 2013-06-07 MED ORDER — IPRATROPIUM BROMIDE 0.02 % IN SOLN
0.5000 mg | Freq: Once | RESPIRATORY_TRACT | Status: AC
  Administered 2013-06-07: 0.5 mg via RESPIRATORY_TRACT
  Filled 2013-06-07: qty 2.5

## 2013-06-07 MED ORDER — ALBUTEROL SULFATE (5 MG/ML) 0.5% IN NEBU
5.0000 mg | INHALATION_SOLUTION | Freq: Once | RESPIRATORY_TRACT | Status: AC
  Administered 2013-06-07: 5 mg via RESPIRATORY_TRACT
  Filled 2013-06-07: qty 1

## 2013-06-07 MED ORDER — ALBUTEROL SULFATE HFA 108 (90 BASE) MCG/ACT IN AERS: 1.0000 | INHALATION_SPRAY | Freq: Four times a day (QID) | RESPIRATORY_TRACT | Status: DC | PRN

## 2013-06-07 MED ORDER — PREDNISONE 20 MG PO TABS: 60.0000 mg | ORAL_TABLET | Freq: Every day | ORAL | Status: DC

## 2013-06-07 NOTE — ED Notes (Signed)
Pt ambulated with no assist 100 ft.  Pt maintained a steady gait.   Pt O2 held constant between 90-91.  Pt heart rate was 85-89.  Pt stated that she felt fine and was much better than before her arrival to the ED.  Pt said that she still had some pain on the left side of her back and had informed the PA about it earlier.

## 2013-06-07 NOTE — ED Notes (Signed)
Pt pulse ox 91% while ambulating halls.

## 2013-06-07 NOTE — Progress Notes (Signed)
   CARE MANAGEMENT ED NOTE 06/07/2013  Patient:  [facility]   Account Number:  [id number]  Date Initiated:  06/07/2013  Documentation initiated by:  Edd Arbour  Subjective/Objective Assessment:   52 yr old female self pay patient d/c from [facility] ED on 06/06/13 with cough medication with narcotic She left CM a voice message stating she could not afford it and her adult care center[facility]wellness [facility] pharmacy will not fill it     Subjective/Objective Assessment Detail:     Action/Plan:   CM received pt voice message and spoke with [facility] EDP, Campos who agreed to change medication to an affordable med, Tessalon 100 mg capsules CM spoke with pt who states her preference is [facility] pharmacy or [facility]   Action/Plan Detail:   CM spoke with chris at [facility] pharmacy and tessalon 100 mg capsules are available to fill for pt Cm returned call, left pt a voice message that Rx called &faxed with fax confirmation at [phone] to [facility] pharmacy CM later spoke with chris Pt at [facility]   Anticipated DC Date:  06/06/2013     Status Recommendation to Physician:   Result of Recommendation:    Other ED Services  Consult Working Plan    DC Planning Services  Other  PCP issues  Medication Assistance  Outpatient Services - Pt will follow up    Choice offered to / List presented to:            Status of service:  Completed, signed off  ED Comments:   ED Comments Detail:

## 2013-06-07 NOTE — Telephone Encounter (Signed)
Patient needs an early morning referral to the [facility] clinic for a pap. Amber Shaw works at 11:15am- 9pm.

## 2013-06-07 NOTE — ED Notes (Signed)
Pt walking hallway with cont pulse ox

## 2013-06-08 NOTE — ED Provider Notes (Signed)
Medical screening examination/treatment/procedure(s) were conducted as a shared visit with non-physician practitioner(s) and myself.  I personally evaluated the patient during the encounter Pt w hx asthma, c/o wheezing. bil wheezing. Alb nebs.    Suzi Roots, MD 06/08/13 1120

## 2013-06-10 NOTE — Telephone Encounter (Signed)
LVM for patient to call family practice to set up her pap. [phone]

## 2014-01-31 ENCOUNTER — Emergency Department (HOSPITAL_COMMUNITY): Payer: Self-pay

## 2014-01-31 ENCOUNTER — Encounter (HOSPITAL_COMMUNITY): Payer: Self-pay | Admitting: Emergency Medicine

## 2014-01-31 ENCOUNTER — Emergency Department (HOSPITAL_COMMUNITY)
Admission: EM | Admit: 2014-01-31 | Discharge: 2014-01-31 | Disposition: A | Discharge status: Home / Self Care | Payer: Self-pay | Attending: Emergency Medicine | Admitting: Emergency Medicine

## 2014-01-31 DIAGNOSIS — I10 Essential (primary) hypertension: Secondary | ICD-10-CM | POA: Insufficient documentation

## 2014-01-31 DIAGNOSIS — IMO0002 Reserved for concepts with insufficient information to code with codable children: Secondary | ICD-10-CM | POA: Insufficient documentation

## 2014-01-31 DIAGNOSIS — Z79899 Other long term (current) drug therapy: Secondary | ICD-10-CM | POA: Insufficient documentation

## 2014-01-31 DIAGNOSIS — R062 Wheezing: Secondary | ICD-10-CM

## 2014-01-31 DIAGNOSIS — J45901 Unspecified asthma with (acute) exacerbation: Secondary | ICD-10-CM

## 2014-01-31 DIAGNOSIS — J441 Chronic obstructive pulmonary disease with (acute) exacerbation: Secondary | ICD-10-CM | POA: Insufficient documentation

## 2014-01-31 DIAGNOSIS — Z87891 Personal history of nicotine dependence: Secondary | ICD-10-CM | POA: Insufficient documentation

## 2014-01-31 HISTORY — DX: Chronic obstructive pulmonary disease, unspecified: J44.9

## 2014-01-31 MED ORDER — ACETAMINOPHEN 325 MG PO TABS
650.0000 mg | ORAL_TABLET | Freq: Once | ORAL | Status: AC
  Administered 2014-01-31: 650 mg via ORAL
  Filled 2014-01-31: qty 2

## 2014-01-31 MED ORDER — IPRATROPIUM BROMIDE 0.02 % IN SOLN
0.5000 mg | Freq: Once | RESPIRATORY_TRACT | Status: AC
  Administered 2014-01-31: 0.5 mg via RESPIRATORY_TRACT
  Filled 2014-01-31: qty 2.5

## 2014-01-31 MED ORDER — ALBUTEROL SULFATE HFA 108 (90 BASE) MCG/ACT IN AERS: 2.0000 | INHALATION_SPRAY | RESPIRATORY_TRACT | Status: DC | PRN

## 2014-01-31 MED ORDER — PREDNISONE 20 MG PO TABS: 60.0000 mg | ORAL_TABLET | Freq: Every day | ORAL | Status: DC

## 2014-01-31 MED ORDER — PREDNISONE 20 MG PO TABS
60.0000 mg | ORAL_TABLET | Freq: Once | ORAL | Status: AC
  Administered 2014-01-31: 60 mg via ORAL
  Filled 2014-01-31: qty 3

## 2014-01-31 MED ORDER — ALBUTEROL SULFATE (2.5 MG/3ML) 0.083% IN NEBU
5.0000 mg | INHALATION_SOLUTION | Freq: Once | RESPIRATORY_TRACT | Status: AC
  Administered 2014-01-31: 5 mg via RESPIRATORY_TRACT
  Filled 2014-01-31: qty 6

## 2014-01-31 MED ORDER — IPRATROPIUM BROMIDE HFA 17 MCG/ACT IN AERS: 2.0000 | INHALATION_SPRAY | Freq: Four times a day (QID) | RESPIRATORY_TRACT | Status: DC

## 2014-01-31 MED ORDER — LORAZEPAM 1 MG PO TABS
0.5000 mg | ORAL_TABLET | Freq: Once | ORAL | Status: AC
  Administered 2014-01-31: 0.5 mg via ORAL
  Filled 2014-01-31 (×2): qty 1

## 2014-01-31 NOTE — Discharge Planning (Signed)
[place] to patient about primary care resources and establishing care with a provider. Patient states her [employer] card expired about 4 to 5 months ago and she hasn't had the chance get it renewed. Patient wants to be re-established with the [place] center. Patient was informed of the walk in hours to turn in her completed application to the practice. [employer] Licensed conveyancer provided, my contact information was also given for any future questions or concerns.

## 2014-01-31 NOTE — ED Notes (Signed)
Pt undressed, in gown, on monitor; bp cuff and pulse oximetry

## 2014-01-31 NOTE — Progress Notes (Signed)
CM received call from [place], [facility] pharmacist, due to discharge prescriptions. She stated that the prescribed Atrovent does not have a patient assist program and is not affordable and she requested a change in the medication. This CM spoke with Dr. Ashok Cordia and he stated to have the prednisone and the albuterol filled and not to fill the atrovent and then have the patient follow up with her PCP there for management medications. This CM called back and spoke with [facility], the pharmacist, and updated on the MD recommendations. No other questions or concerns. Edwyna Shell, RN, BSN, Case Managers 01/31/2014 3:32 PM

## 2014-01-31 NOTE — Discharge Instructions (Signed)
Use albuterol and atrovent inhalers as need, as prescribed. Take prednisone as prescribed. No smoking, and avoid smoky areas.  Follow up with primary care doctor in the next few days. Return to ER right away if worse, trouble breathing, fevers, chest pain, other concern.  You were given medication in the ER that can cause drowsiness - no driving for the next 4 hours.    Asthma Attack Prevention Although there is no way to prevent asthma from starting, you can take steps to control the disease and reduce its symptoms. Learn about your asthma and how to control it. Take an active role to control your asthma by working with your health care provider to create and follow an asthma action plan. An asthma action plan guides you in:  Taking your medicines properly.  Avoiding things that set off your asthma or make your asthma worse (asthma triggers).  Tracking your level of asthma control.  Responding to worsening asthma.  Seeking emergency care when needed. To track your asthma, keep records of your symptoms, check your peak flow number using a handheld device that shows how well air moves out of your lungs (peak flow meter), and get regular asthma checkups.  WHAT ARE SOME WAYS TO PREVENT AN ASTHMA ATTACK?  Take medicines as directed by your health care provider.  Keep track of your asthma symptoms and level of control.  With your health care provider, write a detailed plan for taking medicines and managing an asthma attack. Then be sure to follow your action plan. Asthma is an ongoing condition that needs regular monitoring and treatment.  Identify and avoid asthma triggers. Many outdoor allergens and irritants (such as pollen, mold, cold air, and air pollution) can trigger asthma attacks. Find out what your asthma triggers are and take steps to avoid them.  Monitor your breathing. Learn to recognize warning signs of an attack, such as coughing, wheezing, or shortness of breath. Your lung  function may decrease before you notice any signs or symptoms, so regularly measure and record your peak airflow with a home peak flow meter.  Identify and treat attacks early. If you act quickly, you are less likely to have a severe attack. You will also need less medicine to control your symptoms. When your peak flow measurements decrease and alert you to an upcoming attack, take your medicine as instructed and immediately stop any activity that may have triggered the attack. If your symptoms do not improve, get medical help.  Pay attention to increasing quick-relief inhaler use. If you find yourself relying on your quick-relief inhaler, your asthma is not under control. See your health care provider about adjusting your treatment. WHAT CAN MAKE MY SYMPTOMS WORSE? A number of common things can set off or make your asthma symptoms worse and cause temporary increased inflammation of your airways. Keep track of your asthma symptoms for several weeks, detailing all the environmental and emotional factors that are linked with your asthma. When you have an asthma attack, go back to your asthma diary to see which factor, or combination of factors, might have contributed to it. Once you know what these factors are, you can take steps to control many of them. If you have allergies and asthma, it is important to take asthma prevention steps at home. Minimizing contact with the substance to which you are allergic will help prevent an asthma attack. Some triggers and ways to avoid these triggers are: Animal Dander:  Some people are allergic to the flakes of  skin or dried saliva from animals with fur or feathers.   There is no such thing as a hypoallergenic dog or cat breed. All dogs or cats can cause allergies, even if they don't shed.  Keep these pets out of your home.  If you are not able to keep a pet outdoors, keep the pet out of your bedroom and other sleeping areas at all times, and keep the door  closed.  Remove carpets and furniture covered with cloth from your home. If that is not possible, keep the pet away from fabric-covered furniture and carpets. Dust Mites: Many people with asthma are allergic to dust mites. Dust mites are tiny bugs that are found in every home in mattresses, pillows, carpets, fabric-covered furniture, bedcovers, clothes, stuffed toys, and other fabric-covered items.   Cover your mattress in a special dust-proof cover.  Cover your pillow in a special dust-proof cover, or wash the pillow each week in hot water. Water must be hotter than 130 F (54.4 C) to kill dust mites. Cold or warm water used with detergent and bleach can also be effective.  Wash the sheets and blankets on your bed each week in hot water.  Try not to sleep or lie on cloth-covered cushions.  Call ahead when traveling and ask for a smoke-free hotel room. Bring your own bedding and pillows in case the hotel only supplies feather pillows and down comforters, which may contain dust mites and cause asthma symptoms.  Remove carpets from your bedroom and those laid on concrete, if you can.  Keep stuffed toys out of the bed, or wash the toys weekly in hot water or cooler water with detergent and bleach. Cockroaches: Many people with asthma are allergic to the droppings and remains of cockroaches.   Keep food and garbage in closed containers. Never leave food out.  Use poison baits, traps, powders, gels, or paste (for example, boric acid).  If a spray is used to kill cockroaches, stay out of the room until the odor goes away. Indoor Mold:  Fix leaky faucets, pipes, or other sources of water that have mold around them.  Clean floors and moldy surfaces with a fungicide or diluted bleach.  Avoid using humidifiers, vaporizers, or swamp coolers. These can spread molds through the air. Pollen and Outdoor Mold:  When pollen or mold spore counts are high, try to keep your windows closed.  Stay  indoors with windows closed from late morning to afternoon. Pollen and some mold spore counts are highest at that time.  Ask your health care provider whether you need to take anti-inflammatory medicine or increase your dose of the medicine before your allergy season starts. Other Irritants to Avoid:  Tobacco smoke is an irritant. If you smoke, ask your health care provider how you can quit. Ask family members to quit smoking too. Do not allow smoking in your home or car.  If possible, do not use a wood-burning stove, kerosene heater, or fireplace. Minimize exposure to all sources of smoke, including to incense, candles, fires, and fireworks.  Try to stay away from strong odors and sprays, such as perfume, talcum powder, hair spray, and paints.  Decrease humidity in your home and use an indoor air cleaning device. Reduce indoor humidity to below 60%. Dehumidifiers or central air conditioners can do this.  Decrease house dust exposure by changing furnace and air cooler filters frequently.  Try to have someone else vacuum for you once or twice a week. Stay out of  rooms while they are being vacuumed and for a short while afterward.  If you vacuum, use a dust mask from a hardware store, a double-layered or microfilter vacuum cleaner bag, or a vacuum cleaner with a HEPA filter.  Sulfites in foods and beverages can be irritants. Do not drink beer or wine or eat dried fruit, processed potatoes, or shrimp if they cause asthma symptoms.  Cold air can trigger an asthma attack. Cover your nose and mouth with a scarf on cold or windy days.  Several health conditions can make asthma more difficult to manage, including a runny nose, sinus infections, reflux disease, psychological stress, and sleep apnea. Work with your health care provider to manage these conditions.  Avoid close contact with people who have a respiratory infection such as a cold or the flu, since your asthma symptoms may get worse if you  catch the infection. Wash your hands thoroughly after touching items that may have been handled by people with a respiratory infection.  Get a flu shot every year to protect against the flu virus, which often makes asthma worse for days or weeks. Also get a pneumonia shot if you have not previously had one. Unlike the flu shot, the pneumonia shot does not need to be given yearly. Medicines:  Talk to your health care provider about whether it is safe for you to take aspirin or non-steroidal anti-inflammatory medicines (NSAIDs). In a small number of people with asthma, aspirin and NSAIDs can cause asthma attacks. These medicines must be avoided by people who have known aspirin-sensitive asthma. It is important that people with aspirin-sensitive asthma read labels of all over-the-counter medicines used to treat pain, colds, coughs, and fever.  Beta blockers and ACE inhibitors are other medicines you should discuss with your health care provider. HOW CAN I FIND OUT WHAT I AM ALLERGIC TO? Ask your asthma health care provider about allergy skin testing or blood testing (the RAST test) to identify the allergens to which you are sensitive. If you are found to have allergies, the most important thing to do is to try to avoid exposure to any allergens that you are sensitive to as much as possible. Other treatments for allergies, such as medicines and allergy shots (immunotherapy) are available.  CAN I EXERCISE? Follow your health care provider's advice regarding asthma treatment before exercising. It is important to maintain a regular exercise program, but vigorous exercise, or exercise in cold, humid, or dry environments can cause asthma attacks, especially for those people who have exercise-induced asthma. Document Released: 07/30/2009 Document Revised: 04/13/2013 Document Reviewed: 02/16/2013 [facility] Patient Information 2014 [place].     Asthma, Adult Asthma is a recurring condition in which the  airways tighten and narrow. Asthma can make it difficult to breathe. It can cause coughing, wheezing, and shortness of breath. Asthma episodes (also called asthma attacks) range from minor to life-threatening. Asthma cannot be cured, but medicines and lifestyle changes can help control it. CAUSES Asthma is believed to be caused by inherited (genetic) and environmental factors, but its exact cause is unknown. Asthma may be triggered by allergens, lung infections, or irritants in the air. Asthma triggers are different for each person. Common triggers include:   Animal dander.  Dust mites.  Cockroaches.  Pollen from trees or grass.  Mold.  Smoke.  Air pollutants such as dust, household cleaners, hair sprays, aerosol sprays, paint fumes, strong chemicals, or strong odors.  Cold air, weather changes, and winds (which increase molds and pollens  in the air).  Strong emotional expressions such as crying or laughing hard.  Stress.  Certain medicines (such as aspirin) or types of drugs (such as beta-blockers).  Sulfites in foods and drinks. Foods and drinks that may contain sulfites include dried fruit, potato chips, and sparkling grape juice.  Infections or inflammatory conditions such as the flu, a cold, or an inflammation of the nasal membranes (rhinitis).  Gastroesophageal reflux disease (GERD).  Exercise or strenuous activity. SYMPTOMS Symptoms may occur immediately after asthma is triggered or many hours later. Symptoms include:  Wheezing.  Excessive nighttime or early morning coughing.  Frequent or severe coughing with a common cold.  Chest tightness.  Shortness of breath. DIAGNOSIS  The diagnosis of asthma is made by a review of your medical history and a physical exam. Tests may also be performed. These may include:  Lung function studies. These tests show how much air you breath in and out.  Allergy tests.  Imaging tests such as X-rays. TREATMENT  Asthma cannot  be cured, but it can usually be controlled. Treatment involves identifying and avoiding your asthma triggers. It also involves medicines. There are 2 classes of medicine used for asthma treatment:   Controller medicines. These prevent asthma symptoms from occurring. They are usually taken every day.  Reliever or rescue medicines. These quickly relieve asthma symptoms. They are used as needed and provide short-term relief. Your health care provider will help you create an asthma action plan. An asthma action plan is a written plan for managing and treating your asthma attacks. It includes a list of your asthma triggers and how they may be avoided. It also includes information on when medicines should be taken and when their dosage should be changed. An action plan may also involve the use of a device called a peak flow meter. A peak flow meter measures how well the lungs are working. It helps you monitor your condition. HOME CARE INSTRUCTIONS   Take medicine as directed by your health care provider. Speak with your health care provider if you have questions about how or when to take the medicines.  Use a peak flow meter as directed by your health care provider. Record and keep track of readings.  Understand and use the action plan to help minimize or stop an asthma attack without needing to seek medical care.  Control your home environment in the following ways to help prevent asthma attacks:  Do not smoke. Avoid being exposed to secondhand smoke.  Change your heating and air conditioning filter regularly.  Limit your use of fireplaces and wood stoves.  Get rid of pests (such as roaches and mice) and their droppings.  Throw away plants if you see mold on them.  Clean your floors and dust regularly. Use unscented cleaning products.  Try to have someone else vacuum for you regularly. Stay out of rooms while they are being vacuumed and for a short while afterward. If you vacuum, use a dust  mask from a hardware store, a double-layered or microfilter vacuum cleaner bag, or a vacuum cleaner with a HEPA filter.  Replace carpet with wood, tile, or vinyl flooring. Carpet can trap dander and dust.  Use allergy-proof pillows, mattress covers, and box spring covers.  Wash bed sheets and blankets every week in hot water and dry them in a dryer.  Use blankets that are made of polyester or cotton.  Clean bathrooms and kitchens with bleach. If possible, have someone repaint the walls in these rooms  with mold-resistant paint. Keep out of the rooms that are being cleaned and painted.  Wash hands frequently. SEEK MEDICAL CARE IF:   You have wheezing, shortness of breath, or a cough even if taking medicine to prevent attacks.  The colored mucus you cough up (sputum) is thicker than usual.  Your sputum changes from clear or white to yellow, green, gray, or bloody.  You have any problems that may be related to the medicines you are taking (such as a rash, itching, swelling, or trouble breathing).  You are using a reliever medicine more than 2 3 times per week.  Your peak flow is still at 50 79% of you personal best after following your action plan for 1 hour. SEEK IMMEDIATE MEDICAL CARE IF:   You seem to be getting worse and are unresponsive to treatment during an asthma attack.  You are short of breath even at rest.  You get short of breath when doing very little physical activity.  You have difficulty eating, drinking, or talking due to asthma symptoms.  You develop chest pain.  You develop a fast heartbeat.  You have a bluish color to your lips or fingernails.  You are lightheaded, dizzy, or faint.  Your peak flow is less than 50% of your personal best.  You have a fever or persistent symptoms for more than 2 3 days.  You have a fever and symptoms suddenly get worse. MAKE SURE YOU:   Understand these instructions.  Will watch your condition.  Will get help right  away if you are not doing well or get worse. Document Released: 08/11/2005 Document Revised: 04/13/2013 Document Reviewed: 03/10/2013 [facility] Patient Information 2014 [place], [place].

## 2014-01-31 NOTE — ED Notes (Signed)
Pt to ED from home c/o shortness of breath. Reports unable to lay flat last night. Talking in complete sentences; wheezing noted in all lung fields.

## 2014-01-31 NOTE — ED Notes (Signed)
Dr. Steinl at bedside 

## 2014-01-31 NOTE — ED Provider Notes (Signed)
CSN: [id number]     Arrival date & time 01/31/14  0917 History   First MD Initiated Contact with Patient 01/31/14 [phone]     Chief Complaint  Patient presents with  . Shortness of Breath     (Consider location/radiation/quality/duration/timing/severity/associated sxs/prior Treatment) Patient is a 53 y.o. female presenting with shortness of breath. The history is provided by the patient.  Shortness of Breath Associated symptoms: cough and wheezing   Associated symptoms: no abdominal pain, no chest pain, no fever, no headaches, no neck pain, no rash, no sore throat and no vomiting   pt w hx asthma, c/o non productive cough and increased sob/wheezing in the past 1-2 days. Cough episodic. Wheezing persistent, constant. Tried to use albuterol mdi 3-4 x today without relief, normally uses only on prn basis. Denies current/recent steroid use. w asthma, denies prior admission. Denies fever or chills. No chest pain or discomfort. No lower extremity pain or increased swelling. Feels same as prior asthma exacerbations. Former smoker, quit last month.     Past Medical History  Diagnosis Date  . Hypertension   . Asthma   . COPD (chronic obstructive pulmonary disease)    History reviewed. No pertinent past surgical history. Family History  Problem Relation Age of Onset  . Hypertension Father   . Cancer Other   . Diabetes Other   . Hyperlipidemia Other   . Stroke Other    History  Substance Use Topics  . Smoking status: Former Smoker -- 0.50 packs/day    Types: Cigarettes    Quit date: 01/10/2014  . Smokeless tobacco: Not on file  . Alcohol Use: No   OB History   Grav Para Term Preterm Abortions TAB SAB Ect Mult Living   2         2     Review of Systems  Constitutional: Negative for fever and chills.  HENT: Negative for sore throat.   Eyes: Negative for redness.  Respiratory: Positive for cough, shortness of breath and wheezing.   Cardiovascular: Negative for chest pain,  palpitations and leg swelling.  Gastrointestinal: Negative for vomiting and abdominal pain.  Genitourinary: Negative for flank pain.  Musculoskeletal: Negative for back pain and neck pain.  Skin: Negative for rash.  Neurological: Negative for headaches.  Hematological: Does not bruise/bleed easily.  Psychiatric/Behavioral: Negative for confusion.      Allergies  Percocet  Home Medications   Prior to Admission medications   Medication Sig Start Date End Date Taking? Authorizing Provider  acetaminophen (TYLENOL) 325 MG tablet Take 650 mg by mouth every 4 (four) hours as needed for pain (pain).    Historical Provider, MD  albuterol (PROVENTIL HFA;VENTOLIN HFA) 108 (90 BASE) MCG/ACT inhaler Inhale 2 puffs into the lungs every 6 (six) hours as needed for wheezing or shortness of breath. 04/21/13   Angelica Chessman, MD  albuterol (PROVENTIL HFA;VENTOLIN HFA) 108 (90 BASE) MCG/ACT inhaler Inhale 1-2 puffs into the lungs every 6 (six) hours as needed for wheezing. 06/07/13   Heather Laisure, PA-C  benzonatate (TESSALON) 100 MG capsule Take 1 capsule (100 mg total) by mouth every 8 (eight) hours. 06/07/13   Hoy Morn, MD  DM-Phenylephrine-Acetaminophen (DAY TIME COLD/FLU RELIEF) 10-5-325 MG/15ML LIQD Take 30 mLs by mouth 2 (two) times daily as needed (cold and flu-like symptoms).    Historical Provider, MD  lisinopril-hydrochlorothiazide (ZESTORETIC) 10-12.5 MG per tablet Take 1 tablet by mouth daily. 04/21/13   Angelica Chessman, MD  predniSONE (DELTASONE) 20 MG tablet Take  3 tablets (60 mg total) by mouth daily. 06/07/13   Heather Laisure, PA-C   BP 153/97  Pulse 84  Temp(Src) 98.1 F (36.7 C) (Oral)  Resp 30  SpO2 96%  LMP 05/16/2013 Physical Exam  Nursing note and vitals reviewed. Constitutional: She appears well-developed and well-nourished. No distress.  HENT:  Nose: Nose normal.  Mouth/Throat: Oropharynx is clear and moist.  Eyes: Conjunctivae are normal. No scleral icterus.   Neck: Neck supple. No tracheal deviation present.  Cardiovascular: Normal rate, regular rhythm, normal heart sounds and intact distal pulses.   Pulmonary/Chest: Effort normal. No respiratory distress. She has wheezes.  Abdominal: Soft. Normal appearance and bowel sounds are normal. She exhibits no distension and no mass. There is no tenderness. There is no rebound and no guarding.  Musculoskeletal: She exhibits no edema and no tenderness.  Neurological: She is alert.  Skin: Skin is warm and dry. No rash noted. She is not diaphoretic.  Psychiatric: She has a normal mood and affect.    ED Course  Procedures (including critical care time) Labs Review Results for orders placed in visit on 04/21/13  CMP AND LIVER      Result Value Ref Range   Sodium 140  135 - 145 mEq/L   Potassium 4.5  3.5 - 5.3 mEq/L   Chloride 106  96 - 112 mEq/L   CO2 28  19 - 32 mEq/L   Glucose, Bld 83  70 - 99 mg/dL   BUN 13  6 - 23 mg/dL   Creat 0.74  0.50 - 1.10 mg/dL   Total Bilirubin 0.4  0.3 - 1.2 mg/dL   Alkaline Phosphatase 56  39 - 117 U/L   AST 16  0 - 37 U/L   ALT 14  0 - 35 U/L   Total Protein 6.7  6.0 - 8.3 g/dL   Albumin 4.0  3.5 - 5.2 g/dL   Calcium 8.9  8.4 - 10.5 mg/dL   Bilirubin, Direct 0.1  0.0 - 0.3 mg/dL   Indirect Bilirubin 0.3  0.0 - 0.9 mg/dL  CBC WITH DIFFERENTIAL      Result Value Ref Range   WBC 3.0 (*) 4.0 - 10.5 K/uL   RBC 4.62  3.87 - 5.11 MIL/uL   Hemoglobin 14.5  12.0 - 15.0 g/dL   HCT 43.1  36.0 - 46.0 %   MCV 93.3  78.0 - 100.0 fL   MCH 31.4  26.0 - 34.0 pg   MCHC 33.6  30.0 - 36.0 g/dL   RDW 14.5  11.5 - 15.5 %   Platelets 259  150 - 400 K/uL   Neutrophils Relative % 33 (*) 43 - 77 %   Neutro Abs 1.0 (*) 1.7 - 7.7 K/uL   Lymphocytes Relative 52 (*) 12 - 46 %   Lymphs Abs 1.6  0.7 - 4.0 K/uL   Monocytes Relative 10  3 - 12 %   Monocytes Absolute 0.3  0.1 - 1.0 K/uL   Eosinophils Relative 4  0 - 5 %   Eosinophils Absolute 0.1  0.0 - 0.7 K/uL   Basophils Relative 1   0 - 1 %   Basophils Absolute 0.0  0.0 - 0.1 K/uL   Smear Review Criteria for review not met    URINALYSIS, COMPLETE      Result Value Ref Range   Color, Urine YELLOW  YELLOW   APPearance CLEAR  CLEAR   Specific Gravity, Urine 1.014  1.005 - 1.030  pH 7.0  5.0 - 8.0   Glucose, UA NEG  NEG mg/dL   Bilirubin Urine NEG  NEG   Ketones, ur NEG  NEG mg/dL   Hgb urine dipstick LARGE (*) NEG   Protein, ur NEG  NEG mg/dL   Urobilinogen, UA 0.2  0.0 - 1.0 mg/dL   Nitrite NEG  NEG   Leukocytes, UA NEG  NEG   Squamous Epithelial / LPF FEW  RARE   Crystals NONE SEEN  NONE SEEN   Casts NONE SEEN  NONE SEEN   WBC, UA 0-2  <3 WBC/hpf   RBC / HPF 0-2  <3 RBC/hpf   Bacteria, UA NONE SEEN  RARE  LIPID PANEL      Result Value Ref Range   Cholesterol 202 (*) 0 - 200 mg/dL   Triglycerides 63  <150 mg/dL   HDL 71  >39 mg/dL   Total CHOL/HDL Ratio 2.8     VLDL 13  0 - 40 mg/dL   LDL Cholesterol 118 (*) 0 - 99 mg/dL   Dg Chest 2 View  01/31/2014   CLINICAL DATA:  Difficulty breathing and cough  EXAM: CHEST  2 VIEW  COMPARISON:  June 06, 2013  FINDINGS: There is no edema or consolidation. Heart size and the pulmonary vascularity are within normal limits. No adenopathy. There is mild degenerative change in the thoracic spine.  IMPRESSION: No edema or consolidation.   Electronically Signed   By: Lowella Grip M.D.   On: 01/31/2014 10:11      MDM   Albuterol and atrovent neb. pred po.  Reviewed nursing notes and prior charts for additional history.   Additional albuterol and atrovent nebs.  w steroid rx, and nebs, wheezing markedly improved, good air exchange. No increased wob, pt eating and drinking.  Breathing comfortably, pulse ox 98% ra, appears stable for d/c.      Mirna Mires, MD 01/31/14 1340

## 2014-02-02 ENCOUNTER — Ambulatory Visit: Payer: No Typology Code available for payment source | Attending: Internal Medicine

## 2014-02-06 ENCOUNTER — Ambulatory Visit: Payer: Self-pay | Admitting: Internal Medicine

## 2014-02-08 ENCOUNTER — Other Ambulatory Visit: Payer: Self-pay | Admitting: Internal Medicine

## 2014-02-08 MED ORDER — ALBUTEROL SULFATE HFA 108 (90 BASE) MCG/ACT IN AERS: 2.0000 | INHALATION_SPRAY | RESPIRATORY_TRACT | Status: DC | PRN

## 2014-03-20 ENCOUNTER — Ambulatory Visit: Payer: No Typology Code available for payment source | Attending: Internal Medicine | Admitting: Internal Medicine

## 2014-03-20 ENCOUNTER — Encounter: Payer: Self-pay | Admitting: Internal Medicine

## 2014-03-20 ENCOUNTER — Other Ambulatory Visit (HOSPITAL_COMMUNITY)
Admission: RE | Admit: 2014-03-20 | Discharge: 2014-03-20 | Disposition: A | Discharge status: Home / Self Care | Payer: No Typology Code available for payment source | Source: Ambulatory Visit | Attending: Internal Medicine | Admitting: Internal Medicine

## 2014-03-20 VITALS — BP 168/95 | HR 63 | Temp 97.8°F | Resp 16 | Ht 65.0 in | Wt 180.0 lb

## 2014-03-20 DIAGNOSIS — N760 Acute vaginitis: Secondary | ICD-10-CM | POA: Insufficient documentation

## 2014-03-20 DIAGNOSIS — R300 Dysuria: Secondary | ICD-10-CM

## 2014-03-20 DIAGNOSIS — N390 Urinary tract infection, site not specified: Secondary | ICD-10-CM | POA: Insufficient documentation

## 2014-03-20 DIAGNOSIS — I10 Essential (primary) hypertension: Secondary | ICD-10-CM | POA: Insufficient documentation

## 2014-03-20 DIAGNOSIS — Z1151 Encounter for screening for human papillomavirus (HPV): Secondary | ICD-10-CM | POA: Insufficient documentation

## 2014-03-20 DIAGNOSIS — J42 Unspecified chronic bronchitis: Secondary | ICD-10-CM | POA: Insufficient documentation

## 2014-03-20 DIAGNOSIS — B3749 Other urogenital candidiasis: Secondary | ICD-10-CM | POA: Insufficient documentation

## 2014-03-20 DIAGNOSIS — Z0000 Encounter for general adult medical examination without abnormal findings: Secondary | ICD-10-CM | POA: Insufficient documentation

## 2014-03-20 DIAGNOSIS — Z01419 Encounter for gynecological examination (general) (routine) without abnormal findings: Secondary | ICD-10-CM | POA: Insufficient documentation

## 2014-03-20 DIAGNOSIS — Z113 Encounter for screening for infections with a predominantly sexual mode of transmission: Secondary | ICD-10-CM | POA: Insufficient documentation

## 2014-03-20 LAB — CBC WITH DIFFERENTIAL/PLATELET
BASOS ABS: 0 10*3/uL (ref 0.0–0.1)
BASOS PCT: 1 % (ref 0–1)
EOS ABS: 0.1 10*3/uL (ref 0.0–0.7)
EOS PCT: 2 % (ref 0–5)
HCT: 43.7 % (ref 36.0–46.0)
Hemoglobin: 14.4 g/dL (ref 12.0–15.0)
Lymphocytes Relative: 45 % (ref 12–46)
Lymphs Abs: 1.6 10*3/uL (ref 0.7–4.0)
MCH: 29.9 pg (ref 26.0–34.0)
MCHC: 33 g/dL (ref 30.0–36.0)
MCV: 90.9 fL (ref 78.0–100.0)
MONO ABS: 0.3 10*3/uL (ref 0.1–1.0)
MONOS PCT: 9 % (ref 3–12)
NEUTROS ABS: 1.5 10*3/uL — AB (ref 1.7–7.7)
Neutrophils Relative %: 43 % (ref 43–77)
Platelets: 227 10*3/uL (ref 150–400)
RBC: 4.81 MIL/uL (ref 3.87–5.11)
RDW: 14 % (ref 11.5–15.5)
WBC: 3.6 10*3/uL — ABNORMAL LOW (ref 4.0–10.5)

## 2014-03-20 LAB — LIPID PANEL
Cholesterol: 183 mg/dL (ref 0–200)
HDL: 61 mg/dL (ref >39)
LDL Cholesterol: 104 mg/dL — ABNORMAL HIGH (ref 0–99)
TRIGLYCERIDES: 92 mg/dL (ref <150)
Total CHOL/HDL Ratio: 3 Ratio
VLDL: 18 mg/dL (ref 0–40)

## 2014-03-20 LAB — GLUCOSE, POCT (MANUAL RESULT ENTRY): POC Glucose: 87 mg/dl (ref 70–99)

## 2014-03-20 LAB — POCT URINALYSIS DIPSTICK
BILIRUBIN UA: NEGATIVE
Glucose, UA: NEGATIVE
KETONES UA: NEGATIVE
Leukocytes, UA: NEGATIVE
Nitrite, UA: NEGATIVE
PH UA: 5
Protein, UA: 30
Urobilinogen, UA: 0.2

## 2014-03-20 LAB — COMPLETE METABOLIC PANEL WITH GFR
ALBUMIN: 4 g/dL (ref 3.5–5.2)
ALT: 10 U/L (ref 0–35)
AST: 11 U/L (ref 0–37)
Alkaline Phosphatase: 61 U/L (ref 39–117)
BILIRUBIN TOTAL: 0.3 mg/dL (ref 0.2–1.2)
BUN: 13 mg/dL (ref 6–23)
CO2: 26 mEq/L (ref 19–32)
Calcium: 9 mg/dL (ref 8.4–10.5)
Chloride: 106 mEq/L (ref 96–112)
Creat: 0.71 mg/dL (ref 0.50–1.10)
GFR, Est African American: >89 mL/min
GLUCOSE: 88 mg/dL (ref 70–99)
POTASSIUM: 3.9 meq/L (ref 3.5–5.3)
Sodium: 140 mEq/L (ref 135–145)
Total Protein: 6.8 g/dL (ref 6.0–8.3)

## 2014-03-20 LAB — TSH: TSH: 0.662 u[IU]/mL (ref 0.350–4.500)

## 2014-03-20 LAB — POCT GLYCOSYLATED HEMOGLOBIN (HGB A1C): Hemoglobin A1C: 5.4

## 2014-03-20 MED ORDER — ALBUTEROL SULFATE HFA 108 (90 BASE) MCG/ACT IN AERS: 2.0000 | INHALATION_SPRAY | RESPIRATORY_TRACT | Status: DC | PRN

## 2014-03-20 MED ORDER — LISINOPRIL-HYDROCHLOROTHIAZIDE 10-12.5 MG PO TABS: 1.0000 | ORAL_TABLET | Freq: Every day | ORAL | Status: DC

## 2014-03-20 NOTE — Patient Instructions (Addendum)
Hypertension Hypertension, commonly called high blood pressure, is when the force of blood pumping through your arteries is too strong. Your arteries are the blood vessels that carry blood from your heart throughout your body. A blood pressure reading consists of a higher number over a lower number, such as 110/72. The higher number (systolic) is the pressure inside your arteries when your heart pumps. The lower number (diastolic) is the pressure inside your arteries when your heart relaxes. Ideally you want your blood pressure below 120/80. Hypertension forces your heart to work harder to pump blood. Your arteries may become narrow or stiff. Having hypertension puts you at risk for heart disease, stroke, and other problems.  RISK FACTORS Some risk factors for high blood pressure are controllable. Others are not.  Risk factors you cannot control include:   Race. You may be at higher risk if you are African American.  Age. Risk increases with age.  Gender. Men are at higher risk than women before age 45 years. After age 65, women are at higher risk than men. Risk factors you can control include:  Not getting enough exercise or physical activity.  Being overweight.  Getting too much fat, sugar, calories, or salt in your diet.  Drinking too much alcohol. SIGNS AND SYMPTOMS Hypertension does not usually cause signs or symptoms. Extremely high blood pressure (hypertensive crisis) may cause headache, anxiety, shortness of breath, and nosebleed. DIAGNOSIS  To check if you have hypertension, your health care provider will measure your blood pressure while you are seated, with your arm held at the level of your heart. It should be measured at least twice using the same arm. Certain conditions can cause a difference in blood pressure between your right and left arms. A blood pressure reading that is higher than normal on one occasion does not mean that you need treatment. If one blood pressure reading  is high, ask your health care provider about having it checked again. TREATMENT  Treating high blood pressure includes making lifestyle changes and possibly taking medicine. Living a healthy lifestyle can help lower high blood pressure. You may need to change some of your habits. Lifestyle changes may include:  Following the DASH diet. This diet is high in fruits, vegetables, and whole grains. It is low in salt, red meat, and added sugars.  Getting at least 2 hours of brisk physical activity every week.  Losing weight if necessary.  Not smoking.  Limiting alcoholic beverages.  Learning ways to reduce stress. If lifestyle changes are not enough to get your blood pressure under control, your health care provider may prescribe medicine. You may need to take more than one. Work closely with your health care provider to understand the risks and benefits. HOME CARE INSTRUCTIONS  Have your blood pressure rechecked as directed by your health care provider.   Take medicines only as directed by your health care provider. Follow the directions carefully. Blood pressure medicines must be taken as prescribed. The medicine does not work as well when you skip doses. Skipping doses also puts you at risk for problems.   Do not smoke.   Monitor your blood pressure at home as directed by your health care provider. SEEK MEDICAL CARE IF:   You think you are having a reaction to medicines taken.  You have recurrent headaches or feel dizzy.  You have swelling in your ankles.  You have trouble with your vision. SEEK IMMEDIATE MEDICAL CARE IF:  You develop a severe headache or confusion.    You have unusual weakness, numbness, or feel faint.  You have severe chest or abdominal pain.  You vomit repeatedly.  You have trouble breathing. MAKE SURE YOU:   Understand these instructions.  Will watch your condition.  Will get help right away if you are not doing well or get worse. Document  Released: 08/11/2005 Document Revised: 12/26/2013 Document Reviewed: 06/03/2013 ExitCare Patient Information 2015 [employer]. This information is not intended to replace advice given to you by your health care provider. Make sure you discuss any questions you have with your health care provider. DASH Eating Plan DASH stands for "Dietary Approaches to Stop Hypertension." The DASH eating plan is a healthy eating plan that has been shown to reduce high blood pressure (hypertension). Additional health benefits may include reducing the risk of type 2 diabetes mellitus, heart disease, and stroke. The DASH eating plan may also help with weight loss. WHAT DO I NEED TO KNOW ABOUT THE DASH EATING PLAN? For the DASH eating plan, you will follow these general guidelines:  Choose foods with a percent daily value for sodium of less than 5% (as listed on the food label).  Use salt-free seasonings or herbs instead of table salt or sea salt.  Check with your health care provider or pharmacist before using salt substitutes.  Eat lower-sodium products, often labeled as "lower sodium" or "no salt added."  Eat fresh foods.  Eat more vegetables, fruits, and low-fat dairy products.  Choose whole grains. Look for the word "whole" as the first word in the ingredient list.  Choose fish and skinless chicken or turkey more often than red meat. Limit fish, poultry, and meat to 6 oz (170 g) each day.  Limit sweets, desserts, sugars, and sugary drinks.  Choose heart-healthy fats.  Limit cheese to 1 oz (28 g) per day.  Eat more home-cooked food and less restaurant, buffet, and fast food.  Limit fried foods.  Cook foods using methods other than frying.  Limit canned vegetables. If you do use them, rinse them well to decrease the sodium.  When eating at a restaurant, ask that your food be prepared with less salt, or no salt if possible. WHAT FOODS CAN I EAT? Seek help from a dietitian for individual  calorie needs. Grains Whole grain or whole wheat bread. Brown rice. Whole grain or whole wheat pasta. Quinoa, bulgur, and whole grain cereals. Low-sodium cereals. Corn or whole wheat flour tortillas. Whole grain cornbread. Whole grain crackers. Low-sodium crackers. Vegetables Fresh or frozen vegetables (raw, steamed, roasted, or grilled). Low-sodium or reduced-sodium tomato and vegetable juices. Low-sodium or reduced-sodium tomato sauce and paste. Low-sodium or reduced-sodium canned vegetables.  Fruits All fresh, canned (in natural juice), or frozen fruits. Meat and Other Protein Products Ground beef (85% or leaner), grass-fed beef, or beef trimmed of fat. Skinless chicken or turkey. Ground chicken or turkey. Pork trimmed of fat. All fish and seafood. Eggs. Dried beans, peas, or lentils. Unsalted nuts and seeds. Unsalted canned beans. Dairy Low-fat dairy products, such as skim or 1% milk, 2% or reduced-fat cheeses, low-fat ricotta or cottage cheese, or plain low-fat yogurt. Low-sodium or reduced-sodium cheeses. Fats and Oils Tub margarines without trans fats. Light or reduced-fat mayonnaise and salad dressings (reduced sodium). Avocado. Safflower, olive, or canola oils. Natural peanut or almond butter. Other Unsalted popcorn and pretzels. The items listed above may not be a complete list of recommended foods or beverages. Contact your dietitian for more options. WHAT FOODS ARE NOT RECOMMENDED? Grains White bread.   White pasta. White rice. Refined cornbread. Bagels and croissants. Crackers that contain trans fat. Vegetables Creamed or fried vegetables. Vegetables in a cheese sauce. Regular canned vegetables. Regular canned tomato sauce and paste. Regular tomato and vegetable juices. Fruits Dried fruits. Canned fruit in light or heavy syrup. Fruit juice. Meat and Other Protein Products Fatty cuts of meat. Ribs, chicken wings, bacon, sausage, bologna, salami, chitterlings, fatback, hot dogs,  bratwurst, and packaged luncheon meats. Salted nuts and seeds. Canned beans with salt. Dairy Whole or 2% milk, cream, half-and-half, and cream cheese. Whole-fat or sweetened yogurt. Full-fat cheeses or blue cheese. Nondairy creamers and whipped toppings. Processed cheese, cheese spreads, or cheese curds. Condiments Onion and garlic salt, seasoned salt, table salt, and sea salt. Canned and packaged gravies. Worcestershire sauce. Tartar sauce. Barbecue sauce. Teriyaki sauce. Soy sauce, including reduced sodium. Steak sauce. Fish sauce. Oyster sauce. Cocktail sauce. Horseradish. Ketchup and mustard. Meat flavorings and tenderizers. Bouillon cubes. Hot sauce. Tabasco sauce. Marinades. Taco seasonings. Relishes. Fats and Oils Butter, stick margarine, lard, shortening, ghee, and bacon fat. Coconut, palm kernel, or palm oils. Regular salad dressings. Other Pickles and olives. Salted popcorn and pretzels. The items listed above may not be a complete list of foods and beverages to avoid. Contact your dietitian for more information. WHERE CAN I FIND MORE INFORMATION? National Heart, Lung, and Blood Institute: www.nhlbi.nih.gov/health/health-topics/topics/dash/ Document Released: 07/31/2011 Document Revised: 12/26/2013 Document Reviewed: 06/15/2013 ExitCare Patient Information 2015 [employer]. This information is not intended to replace advice given to you by your health care provider. Make sure you discuss any questions you have with your health care provider.  

## 2014-03-20 NOTE — Progress Notes (Signed)
Pt is here following up on her asthma and her chronic left wrist pain. Pt is requesting a pap smear and a physical.

## 2014-03-20 NOTE — Progress Notes (Signed)
Patient ID: Amber Shaw, female   DOB: Mar 02, 1961, 53 y.o.   MRN: [id number]   Jai Bear, is a 53 y.o. female  [id number]  [id number]  DOB - [birth year]  Chief Complaint  Patient presents with  . Follow-up        Subjective:   Amber Shaw is a 53 y.o. female here today for a follow up visit. Mrs. Carmon would like her annual physical and well-women check. She reports her last pap-smear as 2 years ago with no abnormal results on her last two pap exams. The patient states she is currently sexually active with one partner of greater than 5 years. She states her last menstrual period was one year ago but she had spotting last Tuesday. She says she is experiencing hot flashes. She is unsure of her last mammogram but is sure it is greater than one year ago. She states she has never had a colonoscopy. She is currently having left wrist pain that she states started 6 months ago. She reports the pain as a 5-8 on a scale of 0-10, constant, sharp, burning, and aggravated by activity and worse in th evening. She denies any numbness or tingling. Her medical history include hypertension on lisinopril-hydrochlorothiazide test that was performed milligrams tablet by mouth daily, blood pressure is controlled. Patient has not taken medication today which may be the reason for her elevated blood pressure. She quit smoking last month, she does not drink alcohol.   Problem  Candida Uti  Preventative Health Care   Review of Systems  Genitourinary: Negative for dysuria, urgency and frequency.  Musculoskeletal: Positive for joint pain.  Skin: Negative for itching and rash.  Neurological: Negative for tingling.    ALLERGIES: Allergies  Allergen Reactions  . Percocet [Oxycodone-Acetaminophen] Other (See Comments)    Painful urination     PAST MEDICAL HISTORY: Past Medical History  Diagnosis Date  . Hypertension   . Asthma   . COPD (chronic obstructive pulmonary disease)      MEDICATIONS AT HOME: Prior to Admission medications   Medication Sig Start Date End Date Taking? Authorizing Provider  albuterol (PROVENTIL HFA;VENTOLIN HFA) 108 (90 BASE) MCG/ACT inhaler Inhale 2 puffs into the lungs every 4 (four) hours as needed for wheezing or shortness of breath. 03/20/14  Yes Angelica Chessman, MD  ipratropium (ATROVENT HFA) 17 MCG/ACT inhaler Inhale 2 puffs into the lungs 4 (four) times daily. 01/31/14  Yes Mirna Mires, MD  DM-Phenylephrine-Acetaminophen (TYLENOL COLD MULTI-SYMPTOM DAY PO) Take 2 tablets by mouth daily as needed (for cold symptoms).    Historical Provider, MD  lisinopril-hydrochlorothiazide (ZESTORETIC) 10-12.5 MG per tablet Take 1 tablet by mouth daily. 03/20/14   Angelica Chessman, MD  naproxen sodium (ALEVE) 220 MG tablet Take 440 mg by mouth 2 (two) times daily as needed (for pain).    Historical Provider, MD  predniSONE (DELTASONE) 20 MG tablet Take 3 tablets (60 mg total) by mouth daily. 01/31/14   Mirna Mires, MD     Objective:   Filed Vitals:   03/20/14 0909  BP: 168/95  Pulse: 63  Temp: 97.8 F (36.6 C)  TempSrc: Oral  Resp: 16  Height: 5\' 5"  (1.651 m)  Weight: 180 lb (81.647 kg)  SpO2: 100%    Physical Exam  Constitutional: She is oriented to person, place, and time. She appears well-developed and well-nourished.  HENT:  Head: Normocephalic.  Right Ear: Hearing and external ear normal.  Left Ear: Hearing and external ear normal.  Nose: Nose  normal.  Mouth/Throat: Oropharynx is clear and moist.  Eyes: Conjunctivae, EOM and lids are normal. Pupils are equal, round, and reactive to light.  Neck: Trachea normal and normal range of motion. Neck supple. No mass and no thyromegaly present.  Cardiovascular: Normal rate, regular rhythm, S1 normal, S2 normal and normal heart sounds.   Pulmonary/Chest: Effort normal and breath sounds normal.  Abdominal: Soft. Normal appearance and bowel sounds are normal. She exhibits no distension.  There is no tenderness.  Genitourinary: Vagina normal and uterus normal. No breast swelling, tenderness, discharge or bleeding. There is no rash, tenderness, lesion or injury on the right labia. There is no rash, tenderness, lesion or injury on the left labia. Cervix exhibits no motion tenderness, no discharge and no friability. Right adnexum displays no mass, no tenderness and no fullness. Left adnexum displays no mass, no tenderness and no fullness.  Musculoskeletal: Normal range of motion.  Lymphadenopathy:       Head (right side): No submental, no submandibular, no tonsillar, no preauricular, no posterior auricular and no occipital adenopathy present.       Head (left side): No submental, no submandibular, no tonsillar, no preauricular, no posterior auricular and no occipital adenopathy present.    She has no cervical adenopathy.    She has no axillary adenopathy.       Right: No inguinal and no supraclavicular adenopathy present.       Left: No inguinal and no supraclavicular adenopathy present.  Neurological: She is alert and oriented to person, place, and time. She has normal reflexes.  Skin: Skin is warm, dry and intact. No rash noted. No cyanosis. Nails show no clubbing.  Psychiatric: She has a normal mood and affect. Her speech is normal and behavior is normal. Judgment and thought content normal. Cognition and memory are normal.    Data Review Lab Results  Component Value Date   HGBA1C 5.4 03/20/2014     Assessment & Plan   1. UTI  - Urinalysis Dipstick  2. Preventative health care  - Glucose (CBG) - HgB A1c - Cytology - PAP - Cervicovaginal ancillary only - CBC with Differential - COMPLETE METABOLIC PANEL WITH GFR - Lipid panel - TSH  - MM Digital Screening; Future  - HM COLONOSCOPY - Ambulatory referral to Gastroenterology  3. Essential hypertension, benign Refill - lisinopril-hydrochlorothiazide (ZESTORETIC) 10-12.5 MG per tablet; Take 1 tablet by mouth  daily.  Dispense: 90 tablet; Refill: 3  4. Unspecified chronic bronchitis Refill - albuterol (PROVENTIL HFA;VENTOLIN HFA) 108 (90 BASE) MCG/ACT inhaler; Inhale 2 puffs into the lungs every 4 (four) hours as needed for wheezing or shortness of breath.  Dispense: 3 Inhaler; Refill: 3  Patient was extensively counseled about nutrition and exercise  Return in about 6 months (around 09/20/2014), or if symptoms worsen or fail to improve, for Follow up HTN, asthma.  The patient was given clear instructions to go to ER or return to medical center if symptoms don't improve, worsen or new problems develop. The patient verbalized understanding. The patient was told to call to get lab results if they haven't heard anything in the next week.   This note has been created with Surveyor, quantity. Any transcriptional errors are unintentional.    Angelica Chessman, MD, [place], [place], [place] and [facility] [place], [place]   03/20/2014, 10:28 AM

## 2014-03-22 ENCOUNTER — Telehealth: Payer: Self-pay | Admitting: *Deleted

## 2014-03-22 LAB — CYTOLOGY - PAP

## 2014-03-22 NOTE — Telephone Encounter (Signed)
Message copied by Calyn Rubi, Ardeth Perfect on Wed Mar 22, 2014  6:04 PM ------      Message from: Angelica Chessman E      Created: Wed Mar 22, 2014  5:55 PM       Please inform patient that her laboratory tests results are mostly within normal limit. She is not diabetic.      Her Pap smear results shows that she has infection with Trichomonas. This is a sexually transmitted disease and as a result of her partner needs to be treated. We advised that she abstain from sex while on treatment and ensure that her partner be treated appropriately before resuming sexual activity to prevent reinfection. We are awaiting the Pap smear cytology report.            Please call in prescription metronidazole 2000 mg tablet by mouth once (that is 500 mg tablet, 4 tablets by mouth once). After an alcohol at least 24-48 hours after taking medication. Partner has to be treated.            Please ensure that it is reported to public health Department ------

## 2014-03-22 NOTE — Telephone Encounter (Signed)
Called patient to inform her of test results. Mobile phone is disconnected. Tried home phone and unable to leave a message.

## 2014-03-24 ENCOUNTER — Encounter: Payer: Self-pay | Admitting: Internal Medicine

## 2014-03-24 ENCOUNTER — Telehealth: Payer: Self-pay | Admitting: Emergency Medicine

## 2014-03-24 MED ORDER — METRONIDAZOLE 500 MG PO TABS: 2000.0000 mg | ORAL_TABLET | Freq: Once | ORAL | Status: DC

## 2014-03-24 NOTE — Telephone Encounter (Signed)
Attempted to reach pt to give pap smear results with medication instructions. No voicemail set up and no answer calling home number. Medications sent to pharmacy

## 2014-03-24 NOTE — Telephone Encounter (Signed)
error 

## 2014-03-27 ENCOUNTER — Telehealth: Payer: Self-pay | Admitting: Emergency Medicine

## 2014-03-27 NOTE — Telephone Encounter (Signed)
Pt given pap smear results with medication instructions Flagyl 2 g  To take 4 tablets at once Pt also instructed to have partner tested and treated as well Medication e-scribed to [facility]

## 2014-03-27 NOTE — Telephone Encounter (Addendum)
Pt returning nurse's call. Please f/u with pt . Pt prefers to be called back between 1-1:30pm and 4-4:30pm. Pt is upset because has been trying to reach nurse all afternoon to discuss labs because she was not able to speak free when she initially received results.

## 2014-03-27 NOTE — Telephone Encounter (Signed)
Spoke with pt today; results given with medication instructions to start taking prescribed Flagyl 2g once and have partner treated.Script e-scribed to [employer]

## 2014-05-03 ENCOUNTER — Ambulatory Visit (HOSPITAL_COMMUNITY)
Admission: RE | Admit: 2014-05-03 | Discharge: 2014-05-03 | Disposition: A | Discharge status: Home / Self Care | Payer: No Typology Code available for payment source | Source: Ambulatory Visit | Attending: Internal Medicine | Admitting: Internal Medicine

## 2014-05-03 DIAGNOSIS — Z0000 Encounter for general adult medical examination without abnormal findings: Secondary | ICD-10-CM

## 2014-05-15 ENCOUNTER — Ambulatory Visit (AMBULATORY_SURGERY_CENTER): Payer: Self-pay | Admitting: *Deleted

## 2014-05-15 VITALS — Ht 65.0 in | Wt 178.6 lb

## 2014-05-15 DIAGNOSIS — Z1211 Encounter for screening for malignant neoplasm of colon: Secondary | ICD-10-CM

## 2014-05-15 NOTE — Progress Notes (Signed)
No allergies to eggs or soy. No prior anesthesia.  Pt given Emmi instructions for colonoscopy  No oxygen use  No diet drug use  

## 2014-05-29 ENCOUNTER — Ambulatory Visit (AMBULATORY_SURGERY_CENTER): Payer: No Typology Code available for payment source | Admitting: Internal Medicine

## 2014-05-29 ENCOUNTER — Encounter: Payer: Self-pay | Admitting: Internal Medicine

## 2014-05-29 VITALS — BP 121/76 | HR 60 | Temp 97.0°F | Resp 22 | Ht 65.0 in | Wt 178.0 lb

## 2014-05-29 DIAGNOSIS — D122 Benign neoplasm of ascending colon: Secondary | ICD-10-CM

## 2014-05-29 DIAGNOSIS — D120 Benign neoplasm of cecum: Secondary | ICD-10-CM

## 2014-05-29 DIAGNOSIS — Z1211 Encounter for screening for malignant neoplasm of colon: Secondary | ICD-10-CM

## 2014-05-29 MED ORDER — SODIUM CHLORIDE 0.9 % IV SOLN: 500.0000 mL | INTRAVENOUS | Status: DC

## 2014-05-29 NOTE — Patient Instructions (Signed)
YOU HAD AN ENDOSCOPIC PROCEDURE TODAY AT THE [facility]: Refer to the procedure report that was given to you for any specific questions about what was found during the examination.  If the procedure report does not answer your questions, please call your gastroenterologist to clarify.  If you requested that your care partner not be given the details of your procedure findings, then the procedure report has been included in a sealed envelope for you to review at your convenience later.  YOU SHOULD EXPECT: Some feelings of bloating in the abdomen. Passage of more gas than usual.  Walking can help get rid of the air that was put into your GI tract during the procedure and reduce the bloating. If you had a lower endoscopy (such as a colonoscopy or flexible sigmoidoscopy) you may notice spotting of blood in your stool or on the toilet paper. If you underwent a bowel prep for your procedure, then you may not have a normal bowel movement for a few days.  DIET: Your first meal following the procedure should be a light meal and then it is ok to progress to your normal diet.  A half-sandwich or bowl of soup is an example of a good first meal.  Heavy or fried foods are harder to digest and may make you feel nauseous or bloated.  Likewise meals heavy in dairy and vegetables can cause extra gas to form and this can also increase the bloating.  Drink plenty of fluids but you should avoid alcoholic beverages for 24 hours.  ACTIVITY: Your care partner should take you home directly after the procedure.  You should plan to take it easy, moving slowly for the rest of the day.  You can resume normal activity the day after the procedure however you should NOT DRIVE or use heavy machinery for 24 hours (because of the sedation medicines used during the test).    SYMPTOMS TO REPORT IMMEDIATELY: A gastroenterologist can be reached at any hour.  During normal business hours, 8:30 AM to 5:00 PM Monday through Friday,  call [phone].  After hours and on weekends, please call the GI answering service at [phone] who will take a message and have the physician on call contact you.   Following lower endoscopy (colonoscopy or flexible sigmoidoscopy):  Excessive amounts of blood in the stool  Significant tenderness or worsening of abdominal pains  Swelling of the abdomen that is new, acute  Fever of 100F or higher   FOLLOW UP: If any biopsies were taken you will be contacted by phone or by letter within the next 1-3 weeks.  Call your gastroenterologist if you have not heard about the biopsies in 3 weeks.  Our staff will call the home number listed on your records the next business day following your procedure to check on you and address any questions or concerns that you may have at that time regarding the information given to you following your procedure. This is a courtesy call and so if there is no answer at the home number and we have not heard from you through the emergency physician on call, we will assume that you have returned to your regular daily activities without incident.  SIGNATURES/CONFIDENTIALITY: You and/or your care partner have signed paperwork which will be entered into your electronic medical record.  These signatures attest to the fact that that the information above on your After Visit Summary has been reviewed and is understood.  Full responsibility of the confidentiality of   this discharge information lies with you and/or your care-partner.   INFORMATION ON POLYPS &DIVERTICULOSIS GIVEN TO YOU TODAY

## 2014-05-29 NOTE — Op Note (Signed)
[place]  [employer]. [place], [address]   COLONOSCOPY PROCEDURE REPORT  PATIENT: Amber, Shaw  MR#: [id number] BIRTHDATE: November 22, 1960 , 53  yrs. old GENDER: female ENDOSCOPIST: Eustace Quail, MD REFERRED JG:GEZMOQ Olugbemiga, M.D. PROCEDURE DATE:  05/29/2014 PROCEDURE:   Colonoscopy with snare polypectomy x 2 First Screening Colonoscopy - Avg.  risk and is 50 yrs.  old or older Yes.  Prior Negative Screening - Now for repeat screening. N/A  History of Adenoma - Now for follow-up colonoscopy & has been > or = to 3 yrs.  N/A  Polyps Removed Today? Yes. ASA CLASS:   Class II INDICATIONS:average risk for colorectal cancer. MEDICATIONS: Monitored anesthesia care and Propofol 300 mg IV  DESCRIPTION OF PROCEDURE:   After the risks benefits and alternatives of the procedure were thoroughly explained, informed consent was obtained.  The digital rectal exam revealed no abnormalities of the rectum.   The LB [id number]  endoscope was introduced through the anus and advanced to the cecum, which was identified by both the appendix and ileocecal valve. No adverse events experienced.   The quality of the prep was excellent, using MoviPrep  The instrument was then slowly withdrawn as the colon was fully examined.     COLON FINDINGS: Two polyps were found in the ascending colon (74mm) and at the cecum (42mm).  A polypectomy was performed with a cold snare.  The resection was complete, the polyp tissue was completely retrieved and sent to histology.   There was moderate diverticulosis noted in the left colon.   The examination was otherwise normal.  Retroflexed views revealed no abnormalities. The time to cecum=2 minutes 34 seconds.  Withdrawal time=11 minutes 41 seconds.  The scope was withdrawn and the procedure completed. COMPLICATIONS: There were no immediate complications.  ENDOSCOPIC IMPRESSION: 1.   Two polyps were found in the ascending colon and at the  cecum; polypectomy was performed with a cold snare 2.   Moderate diverticulosis was noted in the left colon 3.   The examination was otherwise normal  RECOMMENDATIONS: 1. Follow up colonoscopy in 5 years  eSigned:  Eustace Quail, MD 05/29/2014 8:38 AM   cc: The Patient    ; Angelica Chessman, MD

## 2014-05-29 NOTE — Progress Notes (Signed)
A/ox3, pleased with MAC, report to RN 

## 2014-05-29 NOTE — Progress Notes (Signed)
Called to room to assist during endoscopic procedure.  Patient ID and intended procedure confirmed with present staff. Received instructions for my participation in the procedure from the performing physician.  

## 2014-05-30 ENCOUNTER — Telehealth: Payer: Self-pay | Admitting: *Deleted

## 2014-05-30 NOTE — Telephone Encounter (Signed)
  Follow up Call-  Call back number 05/29/2014  Post procedure Call Back phone  # [phone]  Permission to leave phone message Yes     Patient questions:  Do you have a fever, pain , or abdominal swelling? No. Pain Score  0 *  Have you tolerated food without any problems? Yes.    Have you been able to return to your normal activities? Yes.    Do you have any questions about your discharge instructions: Diet   No. Medications  No. Follow up visit  No.  Do you have questions or concerns about your Care? No.  Actions: * If pain score is 4 or above: No action needed, pain <4.

## 2014-06-01 ENCOUNTER — Encounter: Payer: Self-pay | Admitting: Internal Medicine

## 2014-06-26 ENCOUNTER — Encounter: Payer: Self-pay | Admitting: Internal Medicine

## 2014-07-10 ENCOUNTER — Telehealth: Payer: Self-pay | Admitting: Internal Medicine

## 2014-07-10 ENCOUNTER — Other Ambulatory Visit: Payer: Self-pay | Admitting: Emergency Medicine

## 2014-07-10 DIAGNOSIS — J42 Unspecified chronic bronchitis: Secondary | ICD-10-CM

## 2014-07-10 DIAGNOSIS — I10 Essential (primary) hypertension: Secondary | ICD-10-CM

## 2014-07-10 MED ORDER — ALBUTEROL SULFATE HFA 108 (90 BASE) MCG/ACT IN AERS: 2.0000 | INHALATION_SPRAY | RESPIRATORY_TRACT | Status: DC | PRN

## 2014-07-10 MED ORDER — LISINOPRIL-HYDROCHLOROTHIAZIDE 10-12.5 MG PO TABS: 1.0000 | ORAL_TABLET | Freq: Every day | ORAL | Status: DC

## 2014-07-10 NOTE — Telephone Encounter (Signed)
Pt calling to request refill of inhaler and bp meds. Please f/u with pt, pt uses [facility] pharmacy.

## 2014-11-13 ENCOUNTER — Encounter (HOSPITAL_COMMUNITY): Payer: Self-pay | Admitting: Emergency Medicine

## 2014-11-13 ENCOUNTER — Inpatient Hospital Stay (HOSPITAL_COMMUNITY)
Admission: EM | Admit: 2014-11-13 | Discharge: 2014-11-15 | DRG: 194 | Disposition: A | Discharge status: Home / Self Care | Payer: 59 | Attending: Internal Medicine | Admitting: Internal Medicine

## 2014-11-13 ENCOUNTER — Emergency Department (HOSPITAL_COMMUNITY): Payer: 59

## 2014-11-13 DIAGNOSIS — J189 Pneumonia, unspecified organism: Principal | ICD-10-CM | POA: Diagnosis present

## 2014-11-13 DIAGNOSIS — N390 Urinary tract infection, site not specified: Secondary | ICD-10-CM | POA: Diagnosis present

## 2014-11-13 DIAGNOSIS — M1990 Unspecified osteoarthritis, unspecified site: Secondary | ICD-10-CM | POA: Diagnosis present

## 2014-11-13 DIAGNOSIS — Z888 Allergy status to other drugs, medicaments and biological substances status: Secondary | ICD-10-CM | POA: Diagnosis not present

## 2014-11-13 DIAGNOSIS — F17210 Nicotine dependence, cigarettes, uncomplicated: Secondary | ICD-10-CM | POA: Diagnosis present

## 2014-11-13 DIAGNOSIS — E860 Dehydration: Secondary | ICD-10-CM | POA: Diagnosis present

## 2014-11-13 DIAGNOSIS — I10 Essential (primary) hypertension: Secondary | ICD-10-CM | POA: Diagnosis present

## 2014-11-13 DIAGNOSIS — J42 Unspecified chronic bronchitis: Secondary | ICD-10-CM | POA: Diagnosis present

## 2014-11-13 DIAGNOSIS — E876 Hypokalemia: Secondary | ICD-10-CM | POA: Diagnosis present

## 2014-11-13 DIAGNOSIS — J45909 Unspecified asthma, uncomplicated: Secondary | ICD-10-CM | POA: Diagnosis present

## 2014-11-13 DIAGNOSIS — J22 Unspecified acute lower respiratory infection: Secondary | ICD-10-CM

## 2014-11-13 DIAGNOSIS — R0602 Shortness of breath: Secondary | ICD-10-CM | POA: Diagnosis present

## 2014-11-13 DIAGNOSIS — J441 Chronic obstructive pulmonary disease with (acute) exacerbation: Secondary | ICD-10-CM | POA: Diagnosis present

## 2014-11-13 LAB — URINE MICROSCOPIC-ADD ON

## 2014-11-13 LAB — CBC
HEMATOCRIT: 48.5 % — AB (ref 36.0–46.0)
Hemoglobin: 16 g/dL — ABNORMAL HIGH (ref 12.0–15.0)
MCH: 30.9 pg (ref 26.0–34.0)
MCHC: 33 g/dL (ref 30.0–36.0)
MCV: 93.6 fL (ref 78.0–100.0)
Platelets: 226 10*3/uL (ref 150–400)
RBC: 5.18 MIL/uL — AB (ref 3.87–5.11)
RDW: 14 % (ref 11.5–15.5)
WBC: 24.3 10*3/uL — AB (ref 4.0–10.5)

## 2014-11-13 LAB — BASIC METABOLIC PANEL
ANION GAP: 8 (ref 5–15)
BUN: 13 mg/dL (ref 6–23)
CHLORIDE: 100 mmol/L (ref 96–112)
CO2: 26 mmol/L (ref 19–32)
Calcium: 8.9 mg/dL (ref 8.4–10.5)
Creatinine, Ser: 0.92 mg/dL (ref 0.50–1.10)
GFR calc non Af Amer: 70 mL/min — ABNORMAL LOW (ref >90)
GFR, EST AFRICAN AMERICAN: 82 mL/min — AB (ref >90)
Glucose, Bld: 111 mg/dL — ABNORMAL HIGH (ref 70–99)
Potassium: 3.2 mmol/L — ABNORMAL LOW (ref 3.5–5.1)
Sodium: 134 mmol/L — ABNORMAL LOW (ref 135–145)

## 2014-11-13 LAB — DIFFERENTIAL
BASOS ABS: 0 10*3/uL (ref 0.0–0.1)
BASOS PCT: 0 % (ref 0–1)
Eosinophils Absolute: 0 10*3/uL (ref 0.0–0.7)
Eosinophils Relative: 0 % (ref 0–5)
Lymphocytes Relative: 6 % — ABNORMAL LOW (ref 12–46)
Lymphs Abs: 1.4 10*3/uL (ref 0.7–4.0)
Monocytes Absolute: 1 10*3/uL (ref 0.1–1.0)
Monocytes Relative: 4 % (ref 3–12)
NEUTROS ABS: 21.4 10*3/uL — AB (ref 1.7–7.7)
NEUTROS PCT: 90 % — AB (ref 43–77)

## 2014-11-13 LAB — URINALYSIS, ROUTINE W REFLEX MICROSCOPIC
BILIRUBIN URINE: NEGATIVE
Glucose, UA: NEGATIVE mg/dL
KETONES UR: NEGATIVE mg/dL
NITRITE: NEGATIVE
Protein, ur: 100 mg/dL — AB
SPECIFIC GRAVITY, URINE: 1.031 — AB (ref 1.005–1.030)
UROBILINOGEN UA: 0.2 mg/dL (ref 0.0–1.0)
pH: 5.5 (ref 5.0–8.0)

## 2014-11-13 LAB — I-STAT CG4 LACTIC ACID, ED
Lactic Acid, Venous: 1.52 mmol/L (ref 0.5–2.0)
Lactic Acid, Venous: 0.68 mmol/L (ref 0.5–2.0)

## 2014-11-13 LAB — MAGNESIUM: MAGNESIUM: 1.7 mg/dL (ref 1.5–2.5)

## 2014-11-13 LAB — HEPATIC FUNCTION PANEL
ALK PHOS: 68 U/L (ref 39–117)
ALT: 19 U/L (ref 0–35)
AST: 23 U/L (ref 0–37)
Albumin: 4.3 g/dL (ref 3.5–5.2)
BILIRUBIN INDIRECT: 1 mg/dL — AB (ref 0.3–0.9)
BILIRUBIN TOTAL: 1.1 mg/dL (ref 0.3–1.2)
Bilirubin, Direct: 0.1 mg/dL (ref 0.0–0.5)
Total Protein: 8.3 g/dL (ref 6.0–8.3)

## 2014-11-13 MED ORDER — SODIUM CHLORIDE 0.9 % IV SOLN
INTRAVENOUS | Status: DC
  Administered 2014-11-13 – 2014-11-14 (×2): via INTRAVENOUS

## 2014-11-13 MED ORDER — DEXTROSE 5 % IV SOLN: 500.0000 mg | INTRAVENOUS | Status: DC

## 2014-11-13 MED ORDER — SODIUM CHLORIDE 0.9 % IV BOLUS (SEPSIS)
1000.0000 mL | Freq: Once | INTRAVENOUS | Status: AC
  Administered 2014-11-13: 1000 mL via INTRAVENOUS

## 2014-11-13 MED ORDER — IPRATROPIUM-ALBUTEROL 0.5-2.5 (3) MG/3ML IN SOLN
RESPIRATORY_TRACT | Status: AC
  Administered 2014-11-13: 3 mL via RESPIRATORY_TRACT
  Filled 2014-11-13: qty 3

## 2014-11-13 MED ORDER — LISINOPRIL-HYDROCHLOROTHIAZIDE 10-12.5 MG PO TABS: 1.0000 | ORAL_TABLET | Freq: Every day | ORAL | Status: DC

## 2014-11-13 MED ORDER — PREDNISONE 50 MG PO TABS
50.0000 mg | ORAL_TABLET | Freq: Every day | ORAL | Status: DC
  Administered 2014-11-13: 50 mg via ORAL
  Filled 2014-11-13 (×2): qty 1

## 2014-11-13 MED ORDER — DEXTROSE 5 % IV SOLN: 1.0000 g | INTRAVENOUS | Status: DC

## 2014-11-13 MED ORDER — SODIUM CHLORIDE 0.9 % IV SOLN
INTRAVENOUS | Status: DC
  Administered 2014-11-13: 125 mL/h via INTRAVENOUS

## 2014-11-13 MED ORDER — ACETAMINOPHEN 325 MG PO TABS
650.0000 mg | ORAL_TABLET | Freq: Four times a day (QID) | ORAL | Status: DC | PRN
  Administered 2014-11-15 (×2): 650 mg via ORAL
  Filled 2014-11-13 (×2): qty 2

## 2014-11-13 MED ORDER — ENOXAPARIN SODIUM 40 MG/0.4ML ~~LOC~~ SOLN
40.0000 mg | SUBCUTANEOUS | Status: DC
  Administered 2014-11-13 – 2014-11-14 (×2): 40 mg via SUBCUTANEOUS
  Filled 2014-11-13 (×3): qty 0.4

## 2014-11-13 MED ORDER — ACETAMINOPHEN 325 MG PO TABS
650.0000 mg | ORAL_TABLET | Freq: Once | ORAL | Status: AC
  Administered 2014-11-13: 650 mg via ORAL
  Filled 2014-11-13: qty 2

## 2014-11-13 MED ORDER — CEFTRIAXONE SODIUM 1 G IJ SOLR
1.0000 g | INTRAMUSCULAR | Status: DC
  Administered 2014-11-14: 1 g via INTRAVENOUS
  Filled 2014-11-13 (×2): qty 10

## 2014-11-13 MED ORDER — LISINOPRIL 10 MG PO TABS
10.0000 mg | ORAL_TABLET | Freq: Every day | ORAL | Status: DC
  Administered 2014-11-13 – 2014-11-15 (×3): 10 mg via ORAL
  Filled 2014-11-13 (×3): qty 1

## 2014-11-13 MED ORDER — GUAIFENESIN ER 600 MG PO TB12
600.0000 mg | ORAL_TABLET | Freq: Two times a day (BID) | ORAL | Status: DC
  Administered 2014-11-13 – 2014-11-15 (×4): 600 mg via ORAL
  Filled 2014-11-13 (×5): qty 1

## 2014-11-13 MED ORDER — HYDROCHLOROTHIAZIDE 12.5 MG PO CAPS
12.5000 mg | ORAL_CAPSULE | Freq: Every day | ORAL | Status: DC
  Administered 2014-11-13 – 2014-11-15 (×3): 12.5 mg via ORAL
  Filled 2014-11-13 (×3): qty 1

## 2014-11-13 MED ORDER — KETOROLAC TROMETHAMINE 15 MG/ML IJ SOLN
15.0000 mg | Freq: Four times a day (QID) | INTRAMUSCULAR | Status: DC | PRN
  Administered 2014-11-13: 15 mg via INTRAVENOUS
  Filled 2014-11-13: qty 1

## 2014-11-13 MED ORDER — DEXTROSE 5 % IV SOLN
500.0000 mg | INTRAVENOUS | Status: DC
  Administered 2014-11-14: 500 mg via INTRAVENOUS
  Filled 2014-11-13: qty 500

## 2014-11-13 MED ORDER — IPRATROPIUM-ALBUTEROL 0.5-2.5 (3) MG/3ML IN SOLN
3.0000 mL | RESPIRATORY_TRACT | Status: DC
  Administered 2014-11-13 – 2014-11-15 (×8): 3 mL via RESPIRATORY_TRACT
  Filled 2014-11-13 (×7): qty 3

## 2014-11-13 MED ORDER — MORPHINE SULFATE 4 MG/ML IJ SOLN
4.0000 mg | Freq: Once | INTRAMUSCULAR | Status: AC
  Administered 2014-11-13: 4 mg via INTRAVENOUS
  Filled 2014-11-13: qty 1

## 2014-11-13 MED ORDER — CEFTRIAXONE SODIUM 1 G IJ SOLR
1.0000 g | INTRAMUSCULAR | Status: DC
  Administered 2014-11-13: 1 g via INTRAVENOUS
  Filled 2014-11-13: qty 10

## 2014-11-13 MED ORDER — DEXTROSE 5 % IV SOLN
500.0000 mg | INTRAVENOUS | Status: DC
  Administered 2014-11-13: 500 mg via INTRAVENOUS
  Filled 2014-11-13: qty 500

## 2014-11-13 NOTE — ED Provider Notes (Signed)
CSN: [id number]     Arrival date & time 11/13/14  1358 History   First MD Initiated Contact with Patient 11/13/14 1603     Chief Complaint  Patient presents with  . Headache  . Cough  . Fatigue     (Consider location/radiation/quality/duration/timing/severity/associated sxs/prior Treatment) HPI Comments: Patient here with two-day history of cough and cold symptoms. Cough has been productive of bloody sputum. Subjective fever at home relief with Tylenol and Motrin. Denies any anginal or CHF symptoms. No vomiting or diarrhea. Denies any photophobia but has had a mild frontal headache from her coughing. Denies any urinary symptoms. Symptoms persistent and nothing makes them worse. She is also been using over-the-counter medications without relief.  Patient is a 54 y.o. female presenting with headaches and cough. The history is provided by the patient.  Headache Associated symptoms: cough   Cough Associated symptoms: headaches     Past Medical History  Diagnosis Date  . Hypertension   . Asthma   . COPD (chronic obstructive pulmonary disease)   . Arthritis    Past Surgical History  Procedure Laterality Date  . No prior surgery     Family History  Problem Relation Age of Onset  . Hypertension Father   . Cancer Other   . Diabetes Other   . Hyperlipidemia Other   . Stroke Other   . Colon cancer Maternal Grandfather 22   History  Substance Use Topics  . Smoking status: Current Every Day Smoker -- 0.50 packs/day    Types: Cigarettes  . Smokeless tobacco: Never Used  . Alcohol Use: No   OB History    Gravida Para Term Preterm AB TAB SAB Ectopic Multiple Living   2         2     Review of Systems  Respiratory: Positive for cough.   Neurological: Positive for headaches.  All other systems reviewed and are negative.     Allergies  Percocet  Home Medications   Prior to Admission medications   Medication Sig Start Date End Date Taking? Authorizing Provider   albuterol (PROVENTIL HFA;VENTOLIN HFA) 108 (90 BASE) MCG/ACT inhaler Inhale 2 puffs into the lungs every 4 (four) hours as needed for wheezing or shortness of breath. 07/10/14   Tresa Garter, MD  lisinopril-hydrochlorothiazide (ZESTORETIC) 10-12.5 MG per tablet Take 1 tablet by mouth daily. 07/10/14   Tresa Garter, MD  naproxen sodium (ALEVE) 220 MG tablet Take 440 mg by mouth 2 (two) times daily as needed (for pain).    Historical Provider, MD   BP 124/73 mmHg  Pulse 101  Temp(Src) 98.8 F (37.1 C) (Oral)  Resp 20  SpO2 96%  LMP 05/16/2013 Physical Exam  Constitutional: She is oriented to person, place, and time. She appears well-developed and well-nourished.  Non-toxic appearance. No distress.  HENT:  Head: Normocephalic and atraumatic.  Eyes: Conjunctivae, EOM and lids are normal. Pupils are equal, round, and reactive to light.  Neck: Normal range of motion. Neck supple. No tracheal deviation present. No thyroid mass present.  Cardiovascular: Normal rate, regular rhythm and normal heart sounds.  Exam reveals no gallop.   No murmur heard. Pulmonary/Chest: Effort normal and breath sounds normal. No stridor. No respiratory distress. She has no decreased breath sounds. She has no wheezes. She has no rhonchi. She has no rales.  Abdominal: Soft. Normal appearance and bowel sounds are normal. She exhibits no distension. There is no tenderness. There is no rebound and no CVA tenderness.  Musculoskeletal: Normal range of motion. She exhibits no edema or tenderness.  Neurological: She is alert and oriented to person, place, and time. She has normal strength. No cranial nerve deficit or sensory deficit. GCS eye subscore is 4. GCS verbal subscore is 5. GCS motor subscore is 6.  Skin: Skin is warm and dry. No abrasion and no rash noted.  Psychiatric: She has a normal mood and affect. Her speech is normal and behavior is normal.  Nursing note and vitals reviewed.   ED Course   Procedures (including critical care time) Labs Review Labs Reviewed  CBC - Abnormal; Notable for the following:    WBC 24.3 (*)    RBC 5.18 (*)    Hemoglobin 16.0 (*)    HCT 48.5 (*)    All other components within normal limits  BASIC METABOLIC PANEL - Abnormal; Notable for the following:    Sodium 134 (*)    Potassium 3.2 (*)    Glucose, Bld 111 (*)    GFR calc non Af Amer 70 (*)    GFR calc Af Amer 82 (*)    All other components within normal limits  URINE CULTURE  URINALYSIS, ROUTINE W REFLEX MICROSCOPIC    Imaging Review No results found.   EKG Interpretation None      MDM   Final diagnoses:  Chest cold    Patient started on antibiotics for community acquired pneumonia. No evidence of sepsis at this time. Spoke with [facility] and they will admit    Lacretia Leigh, MD 11/13/14 2055

## 2014-11-13 NOTE — ED Notes (Signed)
Patient transported to X-ray 

## 2014-11-13 NOTE — ED Notes (Signed)
Pt c/o headache, cough with blood in her sputum, and fatigue that started last night. Pt states that she has been out of her BP meds for month or more.  Pt states that she couldn't get her pump to work last nihgt and used Advair.  Pt also states that she has chills

## 2014-11-13 NOTE — Progress Notes (Signed)
  CARE MANAGEMENT ED NOTE 11/13/2014  Patient:  [facility]   Account Number:  [id number]  Date Initiated:  11/13/2014  Documentation initiated by:  Jackelyn Poling  Subjective/Objective Assessment:   54 yr old self pay [place] resident c/o headache, cough with blood in her sputum, and fatigue that started last night. Pt states that she has been out of her BP meds for month or more.  Pt states that she couldn't get her pump to wo     Subjective/Objective Assessment Detail:   Pcp [facility] per pt but has not been seen since 06/2014 Pt reprots she started being charge more money for her pills at [facility] when thery were free at one time.     Action/Plan:   CM inquired about ability to get her medications, pcp services Encouraged continued [facility] service plus use of [place] med assist or local health dept to get discount/generic medicatoins Gave [place] med assist contact info in [place]   Action/Plan Detail:   health dept contact info plus self pay resources see notes below updated epic   Anticipated DC Date:       Status Recommendation to Physician:   Result of Recommendation:    Other ED Services  Consult Working [place]  Other  Outpatient Services - Pt will follow up  PCP issues    Choice offered to / List presented to:            Status of service:  Completed, signed off  ED Comments:   ED Comments Detail:  CM spoke with pt who confirms self pay [facility] resident with no pcp. CM discussed and provided written information for self pay pcps, importance of pcp for f/u care, www.needymeds.org, www.goodrx.com, discounted pharmacies and other [employer] such as [employer], affordable care act,  financial assistance, DSS and  health department  Reviewed resources for [employer] self pay pcps like Jinny Blossom, family medicine at [address], [facility] family practice, general medical clinics, [facility] urgent care plus others, medication  resources, [facility] out patient pharmacies and housing Pt voiced understanding and appreciation of resources provided

## 2014-11-14 DIAGNOSIS — J42 Unspecified chronic bronchitis: Secondary | ICD-10-CM

## 2014-11-14 DIAGNOSIS — N390 Urinary tract infection, site not specified: Secondary | ICD-10-CM

## 2014-11-14 DIAGNOSIS — J189 Pneumonia, unspecified organism: Principal | ICD-10-CM

## 2014-11-14 DIAGNOSIS — I10 Essential (primary) hypertension: Secondary | ICD-10-CM

## 2014-11-14 LAB — COMPREHENSIVE METABOLIC PANEL
ALBUMIN: 3.3 g/dL — AB (ref 3.5–5.2)
ALT: 15 U/L (ref 0–35)
ANION GAP: 10 (ref 5–15)
AST: 19 U/L (ref 0–37)
Alkaline Phosphatase: 58 U/L (ref 39–117)
BUN: 17 mg/dL (ref 6–23)
CALCIUM: 8.4 mg/dL (ref 8.4–10.5)
CHLORIDE: 104 mmol/L (ref 96–112)
CO2: 24 mmol/L (ref 19–32)
CREATININE: 0.78 mg/dL (ref 0.50–1.10)
GFR calc Af Amer: >90 mL/min (ref >90)
GFR calc non Af Amer: >90 mL/min (ref >90)
Glucose, Bld: 223 mg/dL — ABNORMAL HIGH (ref 70–99)
Potassium: 2.9 mmol/L — ABNORMAL LOW (ref 3.5–5.1)
Sodium: 138 mmol/L (ref 135–145)
Total Bilirubin: 0.8 mg/dL (ref 0.3–1.2)
Total Protein: 6.7 g/dL (ref 6.0–8.3)

## 2014-11-14 LAB — CBC WITH DIFFERENTIAL/PLATELET
BASOS PCT: 0 % (ref 0–1)
Basophils Absolute: 0 10*3/uL (ref 0.0–0.1)
EOS ABS: 0 10*3/uL (ref 0.0–0.7)
Eosinophils Relative: 0 % (ref 0–5)
HCT: 41.3 % (ref 36.0–46.0)
HEMOGLOBIN: 13.4 g/dL (ref 12.0–15.0)
Lymphocytes Relative: 3 % — ABNORMAL LOW (ref 12–46)
Lymphs Abs: 0.5 10*3/uL — ABNORMAL LOW (ref 0.7–4.0)
MCH: 30.5 pg (ref 26.0–34.0)
MCHC: 32.4 g/dL (ref 30.0–36.0)
MCV: 93.9 fL (ref 78.0–100.0)
Monocytes Absolute: 0.5 10*3/uL (ref 0.1–1.0)
Monocytes Relative: 2 % — ABNORMAL LOW (ref 3–12)
Neutro Abs: 20.1 10*3/uL — ABNORMAL HIGH (ref 1.7–7.7)
Neutrophils Relative %: 95 % — ABNORMAL HIGH (ref 43–77)
PLATELETS: 206 10*3/uL (ref 150–400)
RBC: 4.4 MIL/uL (ref 3.87–5.11)
RDW: 14.1 % (ref 11.5–15.5)
WBC: 21.1 10*3/uL — ABNORMAL HIGH (ref 4.0–10.5)

## 2014-11-14 LAB — INFLUENZA PANEL BY PCR (TYPE A & B)
H1N1 flu by pcr: NOT DETECTED
Influenza A By PCR: NEGATIVE
Influenza B By PCR: NEGATIVE

## 2014-11-14 LAB — URINE CULTURE

## 2014-11-14 LAB — LEGIONELLA ANTIGEN, URINE

## 2014-11-14 LAB — CLOSTRIDIUM DIFFICILE BY PCR: Toxigenic C. Difficile by PCR: NEGATIVE

## 2014-11-14 LAB — STREP PNEUMONIAE URINARY ANTIGEN: STREP PNEUMO URINARY ANTIGEN: NEGATIVE

## 2014-11-14 MED ORDER — POTASSIUM CHLORIDE CRYS ER 20 MEQ PO TBCR
40.0000 meq | EXTENDED_RELEASE_TABLET | ORAL | Status: AC
  Administered 2014-11-14 (×2): 40 meq via ORAL
  Filled 2014-11-14 (×2): qty 2

## 2014-11-14 MED ORDER — PREDNISONE 20 MG PO TABS
40.0000 mg | ORAL_TABLET | Freq: Every day | ORAL | Status: DC
  Administered 2014-11-14: 40 mg via ORAL
  Filled 2014-11-14 (×2): qty 2

## 2014-11-14 MED ORDER — PREDNISONE 20 MG PO TABS
40.0000 mg | ORAL_TABLET | Freq: Every day | ORAL | Status: DC
  Filled 2014-11-14: qty 2

## 2014-11-14 NOTE — Progress Notes (Signed)
Nutrition Brief Note  Patient identified on the Malnutrition Screening Tool (MST) Report  Wt Readings from Last 15 Encounters:  11/13/14 170 lb 3.1 oz (77.2 kg)  05/29/14 178 lb (80.74 kg)  05/15/14 178 lb 9.6 oz (81.012 kg)  03/20/14 180 lb (81.647 kg)  04/21/13 181 lb (82.101 kg)  03/08/13 167 lb (75.751 kg)    Body mass index is 28.32 kg/(m^2). Patient meets criteria for overweight based on current BMI.   Current diet order is regular, patient is consuming approximately 75-100% of meals at this time. Labs and medications reviewed.   No nutrition interventions warranted at this time. If nutrition issues arise, please consult RD.   Amber Shaw, [place], [place]

## 2014-11-14 NOTE — Progress Notes (Signed)
[facility] Progress Note   Amber Shaw KNL:[id number] DOB: September 29, 1960 DOA: 11/13/2014 PCP: Angelica Chessman, MD  Brief narrative: Amber Shaw is a 54 y.o. female with a history of COPD and hypertension who comes in with shortness of breath and cough going on for 2 days. She also has felt fevers chills and had diarrhea on the day of admission.  Subjective: Feels like she is breathing better. Diarrhea has resolved since being admitted.  Assessment/Plan: Principal Problem:   CAP (community acquired pneumonia)- leukocytosis- temperature of 100 - Continue Rocephin and Zithromax  Active Problems: COPD  Exacerbation - Taper prednisone to 40 mg --the patient was on Advair at home which she stopped-I have told her that we will resume this on discharge -continue nebs  Diarrhea -C. difficile negative  Dehydration -Continue IV fluids for today  Hypokalemia -May be due to diarrhea-replacing  UTI? -No complaint of dysuria or other urological symptoms-antibiotics will be continued for above-mentioned pneumonia    Essential hypertension, benign -And tinea lisinopril/HCTZ   Appt with PCP: Requested Code Status: Full code Family Communication:  Disposition Plan: Home when stable DVT prophylaxis: Lovenox Consultants: Procedures:  Antibiotics: Anti-infectives    Start     Dose/Rate Route Frequency Ordered Stop   11/14/14 2000  azithromycin (ZITHROMAX) 500 mg in dextrose 5 % 250 mL IVPB     500 mg 250 mL/hr over 60 Minutes Intravenous Every 24 hours 11/13/14 2118 11/20/14 1959   11/14/14 1800  cefTRIAXone (ROCEPHIN) 1 g in dextrose 5 % 50 mL IVPB     1 g 100 mL/hr over 30 Minutes Intravenous Every 24 hours 11/13/14 2117 11/20/14 1759   11/13/14 2115  cefTRIAXone (ROCEPHIN) 1 g in dextrose 5 % 50 mL IVPB  Status:  Discontinued     1 g 100 mL/hr over 30 Minutes Intravenous Every 24 hours 11/13/14 2111 11/13/14 2117   11/13/14 2115  azithromycin (ZITHROMAX) 500 mg in  dextrose 5 % 250 mL IVPB  Status:  Discontinued     500 mg 250 mL/hr over 60 Minutes Intravenous Every 24 hours 11/13/14 2111 11/13/14 2118   11/13/14 1900  azithromycin (ZITHROMAX) 500 mg in dextrose 5 % 250 mL IVPB  Status:  Discontinued     500 mg 250 mL/hr over 60 Minutes Intravenous Every 24 hours 11/13/14 1851 11/13/14 2113   11/13/14 1900  cefTRIAXone (ROCEPHIN) 1 g in dextrose 5 % 50 mL IVPB  Status:  Discontinued     1 g 100 mL/hr over 30 Minutes Intravenous Every 24 hours 11/13/14 1851 11/13/14 2113      Objective: Filed Weights   11/13/14 1934 11/13/14 2249  Weight: 74.844 kg (165 lb) 77.2 kg (170 lb 3.1 oz)    Intake/Output Summary (Last 24 hours) at 11/14/14 1706 Last data filed at 11/14/14 0900  Gross per 24 hour  Intake   1540 ml  Output      0 ml  Net   1540 ml     Vitals Filed Vitals:   11/13/14 2249 11/14/14 0557 11/14/14 0744 11/14/14 1401  BP: 134/88 112/65  132/79  Pulse: 74 90  77  Temp: 98.2 F (36.8 C) 97.9 F (36.6 C)  98 F (36.7 C)  TempSrc: Oral Oral  Oral  Resp:    20  Height: 5\' 5"  (1.651 m)     Weight: 77.2 kg (170 lb 3.1 oz)     SpO2: 97% 98% 97% 100%    Exam:  General:  Pt is alert,  not in acute distress  HEENT: No icterus, No thrush  Cardiovascular: regular rate and rhythm, S1/S2 No murmur  Respiratory: clear to auscultation bilaterally   Abdomen: Soft, +Bowel sounds, non tender, non distended, no guarding  MSK: No LE edema, cyanosis or clubbing  Data Reviewed: Basic Metabolic Panel:  Recent Labs Lab 11/13/14 1521 11/14/14 0703  NA 134* 138  K 3.2* 2.9*  CL 100 104  CO2 26 24  GLUCOSE 111* 223*  BUN 13 17  CREATININE 0.92 0.78  CALCIUM 8.9 8.4  MG 1.7  --    Liver Function Tests:  Recent Labs Lab 11/13/14 1521 11/14/14 0703  AST 23 19  ALT 19 15  ALKPHOS 68 58  BILITOT 1.1 0.8  PROT 8.3 6.7  ALBUMIN 4.3 3.3*   No results for input(s): LIPASE, AMYLASE in the last 168 hours. No results for input(s):  AMMONIA in the last 168 hours. CBC:  Recent Labs Lab 11/13/14 1521 11/14/14 0703  WBC 24.3* 21.1*  NEUTROABS 21.4* 20.1*  HGB 16.0* 13.4  HCT 48.5* 41.3  MCV 93.6 93.9  PLT 226 206   Cardiac Enzymes: No results for input(s): CKTOTAL, CKMB, CKMBINDEX, TROPONINI in the last 168 hours. BNP (last 3 results) No results for input(s): BNP in the last 8760 hours.  ProBNP (last 3 results) No results for input(s): PROBNP in the last 8760 hours.  CBG: No results for input(s): GLUCAP in the last 168 hours.  Recent Results (from the past 240 hour(s))  Blood culture (routine x 2)     Status: None (Preliminary result)   Collection Time: 11/13/14  4:54 PM  Result Value Ref Range Status   Specimen Description BLOOD RIGHT ANTECUBITAL  Final   Special Requests BOTTLES DRAWN AEROBIC AND ANAEROBIC 3CC EACH  Final   Culture   Final           BLOOD CULTURE RECEIVED NO GROWTH TO DATE CULTURE WILL BE HELD FOR 5 DAYS BEFORE ISSUING A FINAL NEGATIVE REPORT Performed at [facility]    Report Status PENDING  Incomplete  Blood culture (routine x 2)     Status: None (Preliminary result)   Collection Time: 11/13/14  4:55 PM  Result Value Ref Range Status   Specimen Description BLOOD LEFT ANTECUBITAL  Final   Special Requests BOTTLES DRAWN AEROBIC ONLY 3CC  Final   Culture   Final           BLOOD CULTURE RECEIVED NO GROWTH TO DATE CULTURE WILL BE HELD FOR 5 DAYS BEFORE ISSUING A FINAL NEGATIVE REPORT Performed at [facility]    Report Status PENDING  Incomplete  Clostridium Difficile by PCR     Status: None   Collection Time: 11/14/14  2:19 PM  Result Value Ref Range Status   C difficile by pcr NEGATIVE NEGATIVE Final     Studies:  Recent x-ray studies have been reviewed in detail by the Attending Physician  Scheduled Meds:  Scheduled Meds: . azithromycin  500 mg Intravenous Q24H  . cefTRIAXone (ROCEPHIN)  IV  1 g Intravenous Q24H  . enoxaparin (LOVENOX) injection  40 mg  Subcutaneous Q24H  . guaiFENesin  600 mg Oral BID  . lisinopril  10 mg Oral Daily   And  . hydrochlorothiazide  12.5 mg Oral Daily  . ipratropium-albuterol  3 mL Nebulization Q4H  . predniSONE  50 mg Oral Q breakfast   Continuous Infusions: . sodium chloride 100 mL/hr at 11/14/14 1457    Time  spent on care of this patient: 35 minutes   [place], MD 11/14/2014, 5:06 PM  LOS: 1 day   [facility] Office  [phone] Pager - Text Page per [email or web address]  If 7PM-7AM, please contact night-coverage [email or web address]

## 2014-11-14 NOTE — Progress Notes (Signed)
CARE MANAGEMENT NOTE 11/14/2014  Patient:  [facility]   Account Number:  [id number]  Date Initiated:  11/14/2014  Documentation initiated by:  Edwyna Shell  Subjective/Objective Assessment:   54 yo female admitted with pna from home     Action/Plan:   discharge planning   Anticipated DC Date:  11/16/2014   Anticipated DC Plan:  [place]  CM consult      Choice offered to / List presented to:             Status of service:  Completed, signed off Medicare Important Message given?   (If response is "NO", the following Medicare IM given date fields will be blank) Date Medicare IM given:   Medicare IM given by:   Date Additional Medicare IM given:   Additional Medicare IM given by:    Discharge Disposition:    Per UR Regulation:    If discussed at Long Length of Stay Meetings, dates discussed:    Comments:  11/14/14 Edwyna Shell RN BSN CM [phone] Patient stated that she is a patient at the [facility] and she does have insurance. Patient stated that she signed up for Obama care at the [facility] and has [facility] insurance. Patient stated that she has not needs at this time.

## 2014-11-14 NOTE — H&P (Signed)
[facility] History and Physical  Patient: Amber Shaw  MRN: [id number]  DOB: [birth year]  DOS: the patient was seen and examined on 11/13/2014 PCP: Angelica Chessman, MD  Chief Complaint: Shortness of breath  HPI: Amber Shaw is a 54 y.o. female with Past medical history of hypertension, COPD. The patient is presenting with complaints of shortness of breath with fatigue and cough ongoing for last 2 days. Patient also complains of fevers and chills. Patient denies any nausea vomiting diarrhea or abdominal pain. Patient denies any travel outside of the states. Cough is nonproductive. Patient quit smoking 3 weeks ago. Patient denies any rash anywhere. No leg pain or leg tenderness no recent travel. No chest pain. She mentions she has been using the albuterol inhaler without any benefit and therefore she used Advair and then her breathing got further worsen therefore she came to the ER. She does of some back pain. Denies any burning urination.  The patient is coming from home. And at her baseline independent for most of her ADL.  Review of Systems: as mentioned in the history of present illness.  A Comprehensive review of the other systems is negative.  Past Medical History  Diagnosis Date  . Hypertension   . Asthma   . COPD (chronic obstructive pulmonary disease)   . Arthritis    Past Surgical History  Procedure Laterality Date  . No prior surgery     Social History:  reports that she has been smoking Cigarettes.  She has been smoking about 0.50 packs per day. She has never used smokeless tobacco. She reports that she does not drink alcohol or use illicit drugs.  Allergies  Allergen Reactions  . Percocet [Oxycodone-Acetaminophen] Other (See Comments)    Painful urination     Family History  Problem Relation Age of Onset  . Hypertension Father   . Cancer Other   . Diabetes Other   . Hyperlipidemia Other   . Stroke Other   . Colon cancer Maternal Grandfather  80    Prior to Admission medications   Medication Sig Start Date End Date Taking? Authorizing Provider  acetaminophen (TYLENOL) 500 MG tablet Take 500 mg by mouth every 6 (six) hours as needed for mild pain, moderate pain or headache.   Yes Historical Provider, MD  albuterol (PROVENTIL HFA;VENTOLIN HFA) 108 (90 BASE) MCG/ACT inhaler Inhale 2 puffs into the lungs every 4 (four) hours as needed for wheezing or shortness of breath. 07/10/14  Yes Tresa Garter, MD  lisinopril-hydrochlorothiazide (ZESTORETIC) 10-12.5 MG per tablet Take 1 tablet by mouth daily. 07/10/14  Yes Tresa Garter, MD  naproxen sodium (ALEVE) 220 MG tablet Take 440 mg by mouth 2 (two) times daily as needed (for pain).   Yes Historical Provider, MD  Phenyleph-Doxylamine-DM-APAP (ALKA SELTZER PLUS PO) Take 1 Package by mouth daily as needed (cold and flu symptoms).   Yes Historical Provider, MD  Phenylephrine-Pheniramine-DM [facility] COLD & COUGH PO) Take 10 mLs by mouth at bedtime as needed (cold and flu symptoms).   Yes Historical Provider, MD    Physical Exam: Filed Vitals:   11/13/14 2018 11/13/14 2125 11/13/14 2223 11/13/14 2249  BP:  130/79  134/88  Pulse:  75  74  Temp: 99.3 F (37.4 C)   98.2 F (36.8 C)  TempSrc: Rectal   Oral  Resp:  18    Height:    5\' 5"  (1.651 m)  Weight:    77.2 kg (170 lb 3.1 oz)  SpO2:  97%  97% 97%    General: Alert, Awake and Oriented to Time, Place and Person. Appear in mild distress Eyes: PERRL ENT: Oral Mucosa clear moist. Neck: no JVD Cardiovascular: S1 and S2 Present, no Murmur, Peripheral Pulses Present Respiratory: Bilateral Air entry equal and Decreased, bilateral expiratory wheezes Abdomen: Bowel Sound present, Soft and non tender Skin: no Rash Extremities: no Pedal edema, no calf tenderness Neurologic: Grossly no focal neuro deficit.  Labs on Admission:  CBC:  Recent Labs Lab 11/13/14 1521  WBC 24.3*  NEUTROABS 21.4*  HGB 16.0*  HCT 48.5*  MCV  93.6  PLT 226    CMP     Component Value Date/Time   NA 134* 11/13/2014 1521   K 3.2* 11/13/2014 1521   CL 100 11/13/2014 1521   CO2 26 11/13/2014 1521   GLUCOSE 111* 11/13/2014 1521   BUN 13 11/13/2014 1521   CREATININE 0.92 11/13/2014 1521   CREATININE 0.71 03/20/2014 1034   CALCIUM 8.9 11/13/2014 1521   PROT 8.3 11/13/2014 1521   ALBUMIN 4.3 11/13/2014 1521   AST 23 11/13/2014 1521   ALT 19 11/13/2014 1521   ALKPHOS 68 11/13/2014 1521   BILITOT 1.1 11/13/2014 1521   GFRNONAA 70* 11/13/2014 1521   GFRNONAA >89 03/20/2014 1034   GFRAA 82* 11/13/2014 1521   GFRAA >89 03/20/2014 1034    No results for input(s): LIPASE, AMYLASE in the last 168 hours.  No results for input(s): CKTOTAL, CKMB, CKMBINDEX, TROPONINI in the last 168 hours. BNP (last 3 results) No results for input(s): BNP in the last 8760 hours.  ProBNP (last 3 results) No results for input(s): PROBNP in the last 8760 hours.   Radiological Exams on Admission: Dg Chest 2 View  11/13/2014   CLINICAL DATA:  Cough, headache and fatigue since last night.  EXAM: CHEST  2 VIEW  COMPARISON:  01/31/2014  FINDINGS: The heart is normal in size and stable. The left hilum appears prominent and demonstrates increased density. On the lateral film this appears to be a left upper lobe infiltrate/pneumonia superimposed on the hilum. No pleural effusion.  IMPRESSION: Left upper lobe pneumonia. I would recommend a followup post treatment chest x-ray in 3-4 weeks to re-evaluate.   Electronically Signed   By: Marijo Sanes M.D.   On: 11/13/2014 18:34   Assessment/Plan Principal Problem:   CAP (community acquired pneumonia) Active Problems:   Essential hypertension, benign   Chronic bronchitis   UTI (lower urinary tract infection)   1. CAP (community acquired pneumonia) The patient is presenting with numbness of cough and shortness of breath. She is tachycardic and tachypneic. She has bilateral expiratory wheezing as well as  basal crackles. Chest x-ray shows left-sided pneumonia. She has significant white count. She does not have any hypoxia. With this she will be admitted in the hospital for close monitoring. She'll be treated with ceftriaxone and azithromycin. Also given prednisone and DuoNeb's. Follow cultures as well as influenza PCR.  2. Hypertension. Continue home medications.  3. Possible UTI. Ceftriaxone will cover her for UTI.  4. Back pain. Toradol and Tylenol as needed.  Advance goals of care discussion: Full code  DVT Prophylaxis: subcutaneous Heparin. Nutrition: Regular diet  Family Communication: Family was present at bedside, opportunity was given to ask question and all questions were answered satisfactorily at the time of interview. Disposition: Admitted to inpatient in med-surge unit.  Author: Berle Mull, MD [facility] Pager: [phone] Addendum: Patient reportedly told the nurse that patient had 8 loose watery  bowel movement prior to her arrival to ER. Patient is currently on contact precaution. C. difficile PCR ordered.  Amber Shaw 5:53 AM 11/14/2014   If 7PM-7AM, please contact night-coverage [email or web address] Password [email or web address]

## 2014-11-14 NOTE — Progress Notes (Signed)
UR complete 

## 2014-11-15 DIAGNOSIS — J441 Chronic obstructive pulmonary disease with (acute) exacerbation: Secondary | ICD-10-CM

## 2014-11-15 LAB — CBC
HEMATOCRIT: 38.7 % (ref 36.0–46.0)
HEMOGLOBIN: 12.3 g/dL (ref 12.0–15.0)
MCH: 29.8 pg (ref 26.0–34.0)
MCHC: 31.8 g/dL (ref 30.0–36.0)
MCV: 93.7 fL (ref 78.0–100.0)
Platelets: 196 10*3/uL (ref 150–400)
RBC: 4.13 MIL/uL (ref 3.87–5.11)
RDW: 14 % (ref 11.5–15.5)
WBC: 14.4 10*3/uL — ABNORMAL HIGH (ref 4.0–10.5)

## 2014-11-15 LAB — BASIC METABOLIC PANEL
ANION GAP: 7 (ref 5–15)
BUN: 12 mg/dL (ref 6–23)
CALCIUM: 8.2 mg/dL — AB (ref 8.4–10.5)
CO2: 26 mmol/L (ref 19–32)
Chloride: 103 mmol/L (ref 96–112)
Creatinine, Ser: 0.67 mg/dL (ref 0.50–1.10)
Glucose, Bld: 145 mg/dL — ABNORMAL HIGH (ref 70–99)
Potassium: 3.7 mmol/L (ref 3.5–5.1)
SODIUM: 136 mmol/L (ref 135–145)

## 2014-11-15 MED ORDER — IPRATROPIUM-ALBUTEROL 0.5-2.5 (3) MG/3ML IN SOLN
3.0000 mL | Freq: Four times a day (QID) | RESPIRATORY_TRACT | Status: DC
  Administered 2014-11-15: 3 mL via RESPIRATORY_TRACT
  Filled 2014-11-15: qty 3

## 2014-11-15 MED ORDER — ALBUTEROL SULFATE HFA 108 (90 BASE) MCG/ACT IN AERS: 2.0000 | INHALATION_SPRAY | RESPIRATORY_TRACT | Status: DC | PRN

## 2014-11-15 MED ORDER — LEVOFLOXACIN 750 MG PO TABS: 750.0000 mg | ORAL_TABLET | Freq: Every day | ORAL | Status: DC

## 2014-11-15 MED ORDER — PREDNISONE 20 MG PO TABS: 40.0000 mg | ORAL_TABLET | Freq: Every day | ORAL | Status: DC

## 2014-11-15 MED ORDER — FLUTICASONE-SALMETEROL 250-50 MCG/DOSE IN AEPB: 1.0000 | INHALATION_SPRAY | Freq: Two times a day (BID) | RESPIRATORY_TRACT | Status: DC

## 2014-11-15 NOTE — Progress Notes (Signed)
Pt 97% on RA while ambulating in hallway.

## 2014-11-15 NOTE — Discharge Summary (Addendum)
Physician Discharge Summary  Maziah Smola JJK:[id number] DOB: December 10, 1960 DOA: 11/13/2014  PCP: Angelica Chessman, MD  Admit date: 11/13/2014 Discharge date: 11/15/2014  Time spent: 50 minutes  Recommendations for Outpatient Follow-up:  1. PFTs as outpt 2. Knows to see PCP in 1 wk  Discharge Condition: stable Diet recommendation: heart healthy, low sodium  Discharge Diagnoses:  Principal Problem:   CAP (community acquired pneumonia) Active Problems:   Essential hypertension, benign   Chronic bronchitis Dehydration Diarrhea  Hypokalemia    History of present illness:  Amber Shaw is a 54 y.o. female with a history of COPD and hypertension who comes in with shortness of breath and cough going on for 2 days. She also has felt fevers chills and had diarrhea on the day of admission.   Hospital Course:  Principal Problem:  CAP (community acquired pneumonia)- leukocytosis- temperature of 100 - Given Rocephin and Zithromax- patient feels good and asking to be discharged home- still has some hemoptysis - will transition to Levaquin today to complete a 7 day course  Active Problems: COPD Exacerbation - Tapered prednisone to 40 mg - will cont for 3 more days- no wheezing yesterday or today --the patient was on Advair at home which she stopped- I have discussed in detail that she will need to resume this -continue albuterol inhaler at needed at home- patient asking for Nebs and is explained that she is too stable to need nebs at home - oxygen level with walking to the end of the hall and back is 97% on room air.   Diarrhea -C. difficile negative- resolved after admission  Dehydration -will d/c IV fluids today  Hypokalemia -May be due to diarrhea- have replaced  UTI? -No complaint of dysuria or other urological symptoms - 70,000 colonies of multiple bacterial morphotypes - antibiotics will be continued for above-mentioned pneumonia   Essential hypertension,  benign -continue lisinopril/HCTZ   Discharge Exam: Filed Weights   11/13/14 1934 11/13/14 2249  Weight: 74.844 kg (165 lb) 77.2 kg (170 lb 3.1 oz)   Filed Vitals:   11/15/14 0929  BP: 128/79  Pulse: 75  Temp: 97.3 F (36.3 C)  Resp: 18    General: AAO x 3, no distress Cardiovascular: RRR, no murmurs  Respiratory: clear to auscultation bilaterally GI: soft, non-tender, non-distended, bowel sound positive  Discharge Instructions You were cared for by a hospitalist during your hospital stay. If you have any questions about your discharge medications or the care you received while you were in the hospital after you are discharged, you can call the unit and asked to speak with the hospitalist on call if the hospitalist that took care of you is not available. Once you are discharged, your primary care physician will handle any further medical issues. Please note that NO REFILLS for any discharge medications will be authorized once you are discharged, as it is imperative that you return to your primary care physician (or establish a relationship with a primary care physician if you do not have one) for your aftercare needs so that they can reassess your need for medications and monitor your lab values.      Discharge Instructions    Diet - low sodium heart healthy    Complete by:  As directed      Discharge instructions    Complete by:  As directed   If you have not already seen [facility] to have PFTs, please schedule and appt with them.     Increase activity slowly  Complete by:  As directed             Medication List    STOP taking these medications        THERAFLU COLD & COUGH PO      TAKE these medications        acetaminophen 500 MG tablet  Commonly known as:  TYLENOL  Take 500 mg by mouth every 6 (six) hours as needed for mild pain, moderate pain or headache.     albuterol 108 (90 BASE) MCG/ACT inhaler  Commonly known as:  PROVENTIL HFA;VENTOLIN HFA   Inhale 2 puffs into the lungs every 4 (four) hours as needed for wheezing or shortness of breath.     ALEVE 220 MG tablet  Generic drug:  naproxen sodium  Take 440 mg by mouth 2 (two) times daily as needed (for pain).     ALKA SELTZER PLUS PO  Take 1 Package by mouth daily as needed (cold and flu symptoms).     Fluticasone-Salmeterol 250-50 MCG/DOSE Aepb  Commonly known as:  ADVAIR DISKUS  Inhale 1 puff into the lungs 2 (two) times daily.     levofloxacin 750 MG tablet  Commonly known as:  LEVAQUIN  Take 1 tablet (750 mg total) by mouth daily.     lisinopril-hydrochlorothiazide 10-12.5 MG per tablet  Commonly known as:  ZESTORETIC  Take 1 tablet by mouth daily.     predniSONE 20 MG tablet  Commonly known as:  DELTASONE  Take 2 tablets (40 mg total) by mouth daily with breakfast.       Allergies  Allergen Reactions  . Percocet [Oxycodone-Acetaminophen] Other (See Comments)    Painful urination    Follow-up Information    Follow up with [facility] In 2 weeks.   Why:  Please follow up with [facility] on April 12 at 3:15pm.   Contact information:   [place] [phone]        The results of significant diagnostics from this hospitalization (including imaging, microbiology, ancillary and laboratory) are listed below for reference.    Significant Diagnostic Studies: Dg Chest 2 View  11/13/2014   CLINICAL DATA:  Cough, headache and fatigue since last night.  EXAM: CHEST  2 VIEW  COMPARISON:  01/31/2014  FINDINGS: The heart is normal in size and stable. The left hilum appears prominent and demonstrates increased density. On the lateral film this appears to be a left upper lobe infiltrate/pneumonia superimposed on the hilum. No pleural effusion.  IMPRESSION: Left upper lobe pneumonia. I would recommend a followup post treatment chest x-ray in 3-4 weeks to re-evaluate.   Electronically Signed   By: Marijo Sanes M.D.    On: 11/13/2014 18:34    Microbiology: Recent Results (from the past 240 hour(s))  Blood culture (routine x 2)     Status: None (Preliminary result)   Collection Time: 11/13/14  4:54 PM  Result Value Ref Range Status   Specimen Description BLOOD RIGHT ANTECUBITAL  Final   Special Requests BOTTLES DRAWN AEROBIC AND ANAEROBIC 3CC EACH  Final   Culture   Final           BLOOD CULTURE RECEIVED NO GROWTH TO DATE CULTURE WILL BE HELD FOR 5 DAYS BEFORE ISSUING A FINAL NEGATIVE REPORT Performed at [facility]    Report Status PENDING  Incomplete  Blood culture (routine x 2)     Status: None (Preliminary result)   Collection Time:  11/13/14  4:55 PM  Result Value Ref Range Status   Specimen Description BLOOD LEFT ANTECUBITAL  Final   Special Requests BOTTLES DRAWN AEROBIC ONLY 3CC  Final   Culture   Final           BLOOD CULTURE RECEIVED NO GROWTH TO DATE CULTURE WILL BE HELD FOR 5 DAYS BEFORE ISSUING A FINAL NEGATIVE REPORT Performed at [facility]    Report Status PENDING  Incomplete  Urine culture     Status: None   Collection Time: 11/13/14  7:24 PM  Result Value Ref Range Status   Specimen Description URINE, CLEAN CATCH  Final   Special Requests NONE  Final   Colony Count   Final    70,000 COLONIES/ML Performed at [facility]    Culture   Final    Multiple bacterial morphotypes present, none predominant. Suggest appropriate recollection if clinically indicated. Performed at [facility]    Report Status 11/14/2014 FINAL  Final  Clostridium Difficile by PCR     Status: None   Collection Time: 11/14/14  2:19 PM  Result Value Ref Range Status   C difficile by pcr NEGATIVE NEGATIVE Final     Labs: Basic Metabolic Panel:  Recent Labs Lab 11/13/14 1521 11/14/14 0703 11/15/14 0525  NA 134* 138 136  K 3.2* 2.9* 3.7  CL 100 104 103  CO2 26 24 26   GLUCOSE 111* 223* 145*  BUN 13 17 12   CREATININE 0.92 0.78 0.67  CALCIUM 8.9 8.4 8.2*  MG  1.7  --   --    Liver Function Tests:  Recent Labs Lab 11/13/14 1521 11/14/14 0703  AST 23 19  ALT 19 15  ALKPHOS 68 58  BILITOT 1.1 0.8  PROT 8.3 6.7  ALBUMIN 4.3 3.3*   No results for input(s): LIPASE, AMYLASE in the last 168 hours. No results for input(s): AMMONIA in the last 168 hours. CBC:  Recent Labs Lab 11/13/14 1521 11/14/14 0703 11/15/14 0525  WBC 24.3* 21.1* 14.4*  NEUTROABS 21.4* 20.1*  --   HGB 16.0* 13.4 12.3  HCT 48.5* 41.3 38.7  MCV 93.6 93.9 93.7  PLT 226 206 196   Cardiac Enzymes: No results for input(s): CKTOTAL, CKMB, CKMBINDEX, TROPONINI in the last 168 hours. BNP: BNP (last 3 results) No results for input(s): BNP in the last 8760 hours.  ProBNP (last 3 results) No results for input(s): PROBNP in the last 8760 hours.  CBG: No results for input(s): GLUCAP in the last 168 hours.     SignedDebbe Odea, MD [facility] 11/15/2014, 1:28 PM

## 2014-11-19 LAB — CULTURE, BLOOD (ROUTINE X 2)
Culture: NO GROWTH
Culture: NO GROWTH

## 2014-12-05 ENCOUNTER — Ambulatory Visit (INDEPENDENT_AMBULATORY_CARE_PROVIDER_SITE_OTHER): Payer: Self-pay | Admitting: Internal Medicine

## 2014-12-05 ENCOUNTER — Ambulatory Visit: Payer: 59

## 2014-12-05 ENCOUNTER — Ambulatory Visit (INDEPENDENT_AMBULATORY_CARE_PROVIDER_SITE_OTHER)
Admission: RE | Admit: 2014-12-05 | Discharge: 2014-12-05 | Disposition: A | Discharge status: Home / Self Care | Payer: Self-pay | Source: Ambulatory Visit | Attending: Internal Medicine | Admitting: Internal Medicine

## 2014-12-05 ENCOUNTER — Encounter: Payer: Self-pay | Admitting: Internal Medicine

## 2014-12-05 VITALS — BP 132/76 | HR 47 | Ht 65.0 in | Wt 173.0 lb

## 2014-12-05 DIAGNOSIS — I10 Essential (primary) hypertension: Secondary | ICD-10-CM

## 2014-12-05 DIAGNOSIS — R0600 Dyspnea, unspecified: Secondary | ICD-10-CM | POA: Insufficient documentation

## 2014-12-05 DIAGNOSIS — J449 Chronic obstructive pulmonary disease, unspecified: Secondary | ICD-10-CM

## 2014-12-05 DIAGNOSIS — J189 Pneumonia, unspecified organism: Secondary | ICD-10-CM

## 2014-12-05 MED ORDER — BUDESONIDE-FORMOTEROL FUMARATE 160-4.5 MCG/ACT IN AERO: INHALATION_SPRAY | RESPIRATORY_TRACT | Status: DC

## 2014-12-05 MED ORDER — LOSARTAN POTASSIUM-HCTZ 50-12.5 MG PO TABS: 1.0000 | ORAL_TABLET | Freq: Every day | ORAL | Status: DC

## 2014-12-05 NOTE — Patient Instructions (Addendum)
symbicort 160 Take 2 puffs first thing in am and then another 2 puffs about 12 hours later.  Work on inhaler technique:  relax and gently blow all the way out then take a nice smooth deep breath back in, triggering the inhaler at same time you start breathing in.  Hold for up to 5 seconds if you can.  Rinse and gargle with water when done       Only use your albuterol as a rescue medication to be used if you can't catch your breath by resting or doing a relaxed purse lip breathing pattern.  - The less you use it, the better it will work when you need it. - Ok to use up to 2 puffs  every 4 hours if you must but call for immediate appointment if use goes up over your usual need - Don't leave home without it !!  (think of it like the spare tire for your car)  Stop lisinopril and start losartan 50/12.5 one daily in its place  Please remember to go to the x-ray department downstairs for your tests - we will call you with the results when they are available.     Please schedule a follow up office visit in 2 weeks, sooner if needed to see Tammy NP for recheck bp/ breathing

## 2014-12-05 NOTE — Progress Notes (Addendum)
Subjective:     Patient ID: Amber Shaw, female   DOB: [birth year], 54 y.o.   MRN: [id number]  HPI   75 yobm quit smoking 11/13/14 with GOLD III COPD dx 12/05/14 sp admit:  Admit date: 11/13/2014 Discharge date: 11/15/2014      Discharge Condition: stable Diet recommendation: heart healthy, low sodium  Discharge Diagnoses:  Principal Problem:  CAP (community acquired pneumonia)    Essential hypertension, benign  Chronic bronchitis Dehydration Diarrhea  Hypokalemia    History of present illness:  Amber Shaw is a 54 y.o. female with a history of COPD and hypertension who comes in with shortness of breath and cough going on for 2 days. She also has felt fevers chills and had diarrhea on the day of admission.   Hospital Course:  Principal Problem:  CAP (community acquired pneumonia)- leukocytosis- temperature of 100 - Given Rocephin and Zithromax- patient feels good and asking to be discharged home- still has some hemoptysis - will transition to Levaquin today to complete a 7 day course  Active Problems: COPD Exacerbation - Tapered prednisone to 40 mg - will cont for 3 more days- no wheezing yesterday or today --the patient was on Advair at home which she stopped- I have discussed in detail that she will need to resume this -continue albuterol inhaler at needed at home- patient asking for Nebs and is explained that she is too stable to need nebs at home - oxygen level with walking to the end of the hall and back is 97% on room air.   Diarrhea -C. difficile negative- resolved after admission  Dehydration -will d/c IV fluids today  Hypokalemia -May be due to diarrhea- have replaced  UTI? -No complaint of dysuria or other urological symptoms - 70,000 colonies of multiple bacterial morphotypes - antibiotics will be continued for above-mentioned pneumonia   Essential hypertension, benign -continue lisinopril/HCTZ   12/05/2014 1st  Pulmonary office visit/  Jennalynn Rivard  / still on ACEi  Chief Complaint  Patient presents with  . HFU    Pt admitted for [facility] to [facility] 11/13/14-11/15/14. She states that her breathing is no better since d/c.  She is using albuterol inhaler 4 x per day on average. She has some sort of neb sol at home that she rarely uses and it was not prescribed to her, it is her family members.   reports much wrose from advair / declined restarting it  Better today not using any inhaers but not at work walking much yet  / hasn't been able to do this easily for over a year due to sob> dry cough   No obvious day to day or daytime variabilty or assoc  cp or chest tightness, subjective wheeze overt sinus or hb symptoms. No unusual exp hx or h/o childhood pna/ asthma or knowledge of premature birth.  Sleeping ok without nocturnal  or early am exacerbation  of respiratory  c/o's or need for noct saba. Also denies any obvious fluctuation of symptoms with weather or environmental changes or other aggravating or alleviating factors except as outlined above   Current Medications, Allergies, Complete Past Medical History, Past Surgical History, Family History, and Social History were reviewed in [employer] record.  ROS  The following are not active complaints unless bolded sore throat, dysphagia, dental problems, itching, sneezing,  nasal congestion or excess/ purulent secretions, ear ache,   fever, chills, sweats, unintended wt loss, pleuritic or exertional cp, hemoptysis,  orthopnea pnd or leg swelling, presyncope, palpitations,  heartburn, abdominal pain, anorexia, nausea, vomiting, diarrhea  or change in bowel or urinary habits, change in stools or urine, dysuria,hematuria,  rash, arthralgias, visual complaints, headache, numbness weakness or ataxia or problems with walking or coordination,  change in mood/affect or memory.         Review of Systems     Objective:   Physical Exam    amb talkative bf nad  Wt Readings from Last  3 Encounters:  12/05/14 173 lb (78.472 kg)  11/13/14 170 lb 3.1 oz (77.2 kg)  05/29/14 178 lb (80.74 kg)    Vital signs reviewed   HEENT: nl dentition, turbinates, and orophanx. Nl external ear canals without cough reflex   NECK :  without JVD/Nodes/TM/ nl carotid upstrokes bilaterally   LUNGS: no acc muscle use, clear to A and P bilaterally without cough on insp or exp maneuvers   CV:  RRR  no s3 or murmur or increase in P2, no edema   ABD:  soft and nontender with nl excursion in the supine position. No bruits or organomegaly, bowel sounds nl  MS:  warm without deformities, calf tenderness, cyanosis or clubbing  SKIN: warm and dry without lesions    NEURO:  alert, approp, no deficits      I personally reviewed images and agree with radiology impression as follows:  CXR:  11/13/14  Left upper lobe pneumonia. I would recommend a followup post treatment chest x-ray in 3-4 weeks to re-evaluate.  CXR 12/05/14 1. Clearing of left upper lobe pneumonia. 2. Mild peribronchial thickening may indicate bronchitis. Assessment:

## 2014-12-06 ENCOUNTER — Encounter: Payer: Self-pay | Admitting: Internal Medicine

## 2014-12-06 DIAGNOSIS — J449 Chronic obstructive pulmonary disease, unspecified: Secondary | ICD-10-CM | POA: Insufficient documentation

## 2014-12-06 NOTE — Assessment & Plan Note (Signed)
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

## 2014-12-06 NOTE — Assessment & Plan Note (Addendum)
-   spirometry 12/05/14  FEV1 0.98 (42%) ratio 60  - try off acei 12/05/14 > - 12/05/2014 p extensive coaching HFA effectiveness =    75% > try symbicort 160 2bid      I reviewed the Flethcher curve with patient that basically indicates  if you quit smoking when your best day FEV1 is still relatively well preserved   it is highly unlikely you will progress to end stage  disease and informed the patient there was no medication on the market that has proven to change the curve or the likelihood of progression.  Therefore stopping smoking and maintaining abstinence is the most important aspect of care, not choice of inhalers or for that matter, doctors.

## 2014-12-06 NOTE — Assessment & Plan Note (Signed)
Suspect a sign component related to acei, will re -eval in 2 weeks

## 2014-12-06 NOTE — Assessment & Plan Note (Addendum)
See admit 11/13/14 > rx levaquin > cleared 12/05/14 no further f/u

## 2014-12-07 ENCOUNTER — Inpatient Hospital Stay: Payer: 59 | Admitting: Internal Medicine

## 2014-12-11 ENCOUNTER — Encounter: Payer: Self-pay | Admitting: Family Medicine

## 2014-12-11 ENCOUNTER — Ambulatory Visit: Payer: Self-pay | Attending: Family Medicine | Admitting: Family Medicine

## 2014-12-11 VITALS — BP 152/97 | HR 55 | Temp 98.1°F | Resp 18 | Ht 65.0 in | Wt 172.4 lb

## 2014-12-11 DIAGNOSIS — J189 Pneumonia, unspecified organism: Secondary | ICD-10-CM

## 2014-12-11 DIAGNOSIS — N760 Acute vaginitis: Secondary | ICD-10-CM

## 2014-12-11 DIAGNOSIS — Z87891 Personal history of nicotine dependence: Secondary | ICD-10-CM | POA: Insufficient documentation

## 2014-12-11 DIAGNOSIS — M25512 Pain in left shoulder: Secondary | ICD-10-CM

## 2014-12-11 DIAGNOSIS — D72829 Elevated white blood cell count, unspecified: Secondary | ICD-10-CM | POA: Insufficient documentation

## 2014-12-11 DIAGNOSIS — M25511 Pain in right shoulder: Secondary | ICD-10-CM | POA: Insufficient documentation

## 2014-12-11 DIAGNOSIS — Z7951 Long term (current) use of inhaled steroids: Secondary | ICD-10-CM | POA: Insufficient documentation

## 2014-12-11 DIAGNOSIS — J449 Chronic obstructive pulmonary disease, unspecified: Secondary | ICD-10-CM

## 2014-12-11 DIAGNOSIS — I10 Essential (primary) hypertension: Secondary | ICD-10-CM

## 2014-12-11 MED ORDER — FLUCONAZOLE 150 MG PO TABS: 150.0000 mg | ORAL_TABLET | Freq: Once | ORAL | Status: DC

## 2014-12-11 MED ORDER — PREDNISONE 5 MG PO TABS: 5.0000 mg | ORAL_TABLET | Freq: Every day | ORAL | Status: DC

## 2014-12-11 MED ORDER — METRONIDAZOLE 0.75 % VA GEL: 1.0000 | Freq: Two times a day (BID) | VAGINAL | Status: DC

## 2014-12-11 NOTE — Progress Notes (Signed)
Subjective:    Patient ID: Amber Shaw, female    DOB: 09-28-1960, 54 y.o.   MRN: [id number]  HPI  Amber Shaw was hospitalized at [facility] from 11/13/14-11/15/14 after she had presented with shortness of breath, cough, fever and chills. Labs revealed leukocytosis with a white blood cell count of 24.3, UA was positive for a UTI, and chest x-ray revealed a left upper lobe pneumonia. She was admitted and commenced on IV ceftriaxone and azithromycin, prednisone and nebulizer treatments were given. Other labs revealed negative blood and urine cultures, and she tested negative for H1N1 and flu.   Interval history: Reports doing better and is no more dyspneic. She has been to see Amber Shaw pulmonology where she did have a spirometry done which revealed FEV1 0.98 (42% of predicted) ratio 60. She was placed on Symbicort.  The patient complains of a vaginal discharge which she describes as yellowish and itchy ever since she completed her antibiotics. She denies urinary symptoms. Both shoulders hurt and she describes pain as 9 out of 10 she also notices difficulty fully elevating the shoulders; she does a lot of pulling at her job. Blood pressure is notably elevated, she was taken off an ACE inhibitor which was thought to cause cough by her pulmonologist and replaced with losartan/HCTZ which she is yet to pick up.  Past Medical History  Diagnosis Date  . Hypertension   . Asthma   . COPD (chronic obstructive pulmonary disease)   . Arthritis     Past Surgical History  Procedure Laterality Date  . No prior surgery      History   Social History  . Marital Status: Single    Spouse Name: N/A  . Number of Children: N/A  . Years of Education: N/A   Occupational History  . Not on file.   Social History Main Topics  . Smoking status: Former Smoker -- 0.50 packs/day for 18 years    Types: Cigarettes    Quit date: 11/13/2014  . Smokeless tobacco: Never Used  . Alcohol Use: No    . Drug Use: No  . Sexual Activity: Not on file   Other Topics Concern  . Not on file   Social History Narrative    Allergies  Allergen Reactions  . Percocet [Oxycodone-Acetaminophen] Other (See Comments)    Painful urination     Current Outpatient Prescriptions on File Prior to Visit  Medication Sig Dispense Refill  . acetaminophen (TYLENOL) 500 MG tablet Take 500 mg by mouth every 6 (six) hours as needed for mild pain, moderate pain or headache.    . budesonide-formoterol (SYMBICORT) 160-4.5 MCG/ACT inhaler Take 2 puffs first thing in am and then another 2 puffs about 12 hours later.    Marland Kitchen losartan-hydrochlorothiazide (HYZAAR) 50-12.5 MG per tablet Take 1 tablet by mouth daily. 30 tablet 11  . naproxen sodium (ALEVE) 220 MG tablet Take 440 mg by mouth 2 (two) times daily as needed (for pain).    Marland Kitchen albuterol (PROVENTIL HFA;VENTOLIN HFA) 108 (90 BASE) MCG/ACT inhaler Inhale 2 puffs into the lungs every 4 (four) hours as needed for wheezing or shortness of breath. (Patient not taking: Reported on 12/11/2014) 3 Inhaler 3  . Phenyleph-Doxylamine-DM-APAP (ALKA SELTZER PLUS PO) Take 1 Package by mouth daily as needed (cold and flu symptoms).     No current facility-administered medications on file prior to visit.      Review of Systems  Constitutional: Negative for activity change, appetite change and fatigue.  HENT: Negative for congestion, sinus pressure and sore throat.   Eyes: Negative for visual disturbance.  Respiratory: Negative for cough, chest tightness, shortness of breath and wheezing.   Cardiovascular: Negative for chest pain and palpitations.  Gastrointestinal: Negative for abdominal pain, constipation and abdominal distention.  Endocrine: Negative for polydipsia.  Genitourinary:       See HPI  Musculoskeletal:       See HPI  Skin: Negative for rash.  Neurological: Negative for tremors, light-headedness and numbness.  Hematological: Does not bruise/bleed easily.   Psychiatric/Behavioral: Negative for behavioral problems and agitation.         Objective: Filed Vitals:   12/11/14 0913  BP: 152/97  Pulse: 55  Temp: 98.1 F (36.7 C)  Resp: 18      Physical Exam  Constitutional: She is oriented to person, place, and time. She appears well-developed and well-nourished. No distress.  HENT:  Head: Normocephalic.  Right Ear: External ear normal.  Left Ear: External ear normal.  Nose: Nose normal.  Mouth/Throat: Oropharynx is clear and moist.  Eyes: Conjunctivae and EOM are normal. Pupils are equal, round, and reactive to light.  Neck: Normal range of motion. No JVD present.  Cardiovascular: Regular rhythm, normal heart sounds and intact distal pulses.  Bradycardia present.  Exam reveals no gallop.   No murmur heard. Pulmonary/Chest: Effort normal and breath sounds normal. No respiratory distress. She has no wheezes. She has no rales. She exhibits no tenderness.  Abdominal: Soft. Bowel sounds are normal. She exhibits no distension and no mass. There is no tenderness.  Musculoskeletal:       Right shoulder: She exhibits decreased range of motion and tenderness. She exhibits no swelling.       Left shoulder: She exhibits decreased range of motion and tenderness. She exhibits no swelling.       Arms: Neurological: She is alert and oriented to person, place, and time. She has normal reflexes.  Skin: Skin is warm and dry. She is not diaphoretic.  Psychiatric: She has a normal mood and affect.     EXAM: CHEST 2 VIEW  COMPARISON: Chest x-ray of 11/13/2014  FINDINGS: The left upper lobe suprahilar pneumonia has cleared. There is some peribronchial thickening which may indicate residual bronchitis. No effusion is seen. The heart remains within upper limits of normal. There are degenerative changes throughout the thoracic spine.  IMPRESSION: 1. Clearing of left upper lobe pneumonia. 2. Mild peribronchial thickening may indicate  bronchitis.   Electronically Signed  By: Ivar Drape M.D.  On: 12/06/2014 08:28        Assessment & Plan:  54 year old female patient recently hospitalized for community-acquired pneumonia which has resolved at this time and with the recent diagnosis of COPD currently on inhaled corticosteroids.  COPD: Gold III, currently stable on Symbicort. She has quit smoking and has been commended.  Community-acquired pneumonia: Resolved evidenced by resolution on CXR Leukocytosis on last set of labs and so i will be repeating a CBC  Essential hypertension: Unontrolled She is yet to pick up losartan/HCTZ from the pharmacy which could explain elevation at today's visit. Will need to be reassessed at her next office visit  Vaginitis: I'm placing her on MetroGel; given her history of recent antibiotic use this puts her at risk for vaginal candidiasis and so I would add Diflucan to her regimen.   Shoulder pain: Cannot exclude tendinitis at this time and so I am placing her on a short course steroids therapy and she will  need to be reassessed at her next visit for the need for physical therapy if symptoms do not resolve.

## 2014-12-11 NOTE — Progress Notes (Signed)
Patient hospitalized 3/21-3/23 for Pneumonia and bronchitis. Pulmonologist changed patients meds from albuterol to symbicort. Patient thinks she has yeast infection from antibiotics, reports discharge and burning with bathing. Patient reports pain at level 9 in shoulders, described as shooting, stabbing, it "comes and goes", patient can't lift arms since being in hospital. Patient has elevated blood pressure, has not had BP med this morning.

## 2014-12-11 NOTE — Patient Instructions (Signed)

## 2014-12-12 LAB — CBC WITH DIFFERENTIAL/PLATELET
BASOS ABS: 0 10*3/uL (ref 0.0–0.1)
BASOS PCT: 0 % (ref 0–1)
EOS ABS: 0 10*3/uL (ref 0.0–0.7)
Eosinophils Relative: 1 % (ref 0–5)
HEMATOCRIT: 43.3 % (ref 36.0–46.0)
Hemoglobin: 14 g/dL (ref 12.0–15.0)
Lymphocytes Relative: 51 % — ABNORMAL HIGH (ref 12–46)
Lymphs Abs: 1.7 10*3/uL (ref 0.7–4.0)
MCH: 29.5 pg (ref 26.0–34.0)
MCHC: 32.3 g/dL (ref 30.0–36.0)
MCV: 91.4 fL (ref 78.0–100.0)
MONO ABS: 0.6 10*3/uL (ref 0.1–1.0)
MONOS PCT: 19 % — AB (ref 3–12)
MPV: 10.4 fL (ref 8.6–12.4)
Neutro Abs: 1 10*3/uL — ABNORMAL LOW (ref 1.7–7.7)
Neutrophils Relative %: 29 % — ABNORMAL LOW (ref 43–77)
PLATELETS: 242 10*3/uL (ref 150–400)
RBC: 4.74 MIL/uL (ref 3.87–5.11)
RDW: 14.2 % (ref 11.5–15.5)
WBC: 3.4 10*3/uL — AB (ref 4.0–10.5)

## 2014-12-14 ENCOUNTER — Telehealth: Payer: Self-pay

## 2014-12-14 NOTE — Telephone Encounter (Signed)
Nurse called home number, reached voicemail, did not leave message. Nurse call both cell phones, no answer or voicemail.

## 2014-12-14 NOTE — Telephone Encounter (Signed)
-----   Message from Arnoldo Morale, MD sent at 12/13/2014 10:43 PM EDT ----- Previously elevated WBC have trended down

## 2014-12-15 NOTE — Telephone Encounter (Signed)
Nurse called mobile number, reached recording stating "The person you have reached is not accepting calls at this time".

## 2014-12-15 NOTE — Telephone Encounter (Signed)
Nurse called home number, did not leave voicemail due to voicemail recording does not state telephone number or an name. Voicemail only states "Please leave a message".

## 2014-12-15 NOTE — Telephone Encounter (Signed)
-----   Message from Arnoldo Morale, MD sent at 12/13/2014 10:43 PM EDT ----- Previously elevated WBC have trended down

## 2014-12-19 ENCOUNTER — Ambulatory Visit (INDEPENDENT_AMBULATORY_CARE_PROVIDER_SITE_OTHER): Payer: Self-pay | Admitting: Adult Health

## 2014-12-19 ENCOUNTER — Encounter (INDEPENDENT_AMBULATORY_CARE_PROVIDER_SITE_OTHER): Payer: Self-pay

## 2014-12-19 ENCOUNTER — Encounter: Payer: Self-pay | Admitting: Adult Health

## 2014-12-19 VITALS — BP 138/68 | HR 65 | Temp 98.2°F | Ht 65.0 in | Wt 171.8 lb

## 2014-12-19 DIAGNOSIS — I10 Essential (primary) hypertension: Secondary | ICD-10-CM

## 2014-12-19 MED ORDER — BUDESONIDE-FORMOTEROL FUMARATE 160-4.5 MCG/ACT IN AERO: INHALATION_SPRAY | RESPIRATORY_TRACT | Status: DC

## 2014-12-19 NOTE — Assessment & Plan Note (Signed)
Improved control on Symbicort and off ACE   Plan  Continue on Symbicort 2 puffs Twice daily  , rinse after use.  Great job on not smoking.  Follow up with primary MD for blood pressure control .  follow up Dr. Melvyn Novas  In 6 weeks PFT and As needed

## 2014-12-19 NOTE — Patient Instructions (Addendum)
Continue on Symbicort 2 puffs Twice daily  , rinse after use.  Great job on not smoking.  Follow up with primary MD for blood pressure control .  follow up Dr. Melvyn Novas  In 6 weeks PFT and As needed

## 2014-12-19 NOTE — Progress Notes (Signed)
Subjective:     Patient ID: Amber Shaw, female   DOB: 1960-08-27, 54 y.o.   MRN: [id number]  HPI   11 yobm quit smoking 11/13/14 with GOLD III COPD dx 12/05/14 sp admit:  Admit date: 11/13/2014 Discharge date: 11/15/2014      Discharge Condition: stable Diet recommendation: heart healthy, low sodium  Discharge Diagnoses:  Principal Problem:  CAP (community acquired pneumonia)    Essential hypertension, benign  Chronic bronchitis Dehydration Diarrhea  Hypokalemia    History of present illness:  Amber Shaw is a 54 y.o. female with a history of COPD and hypertension who comes in with shortness of breath and cough going on for 2 days. She also has felt fevers chills and had diarrhea on the day of admission.   Hospital Course:  Principal Problem:  CAP (community acquired pneumonia)- leukocytosis- temperature of 100 - Given Rocephin and Zithromax- patient feels good and asking to be discharged home- still has some hemoptysis - will transition to Levaquin today to complete a 7 day course  Active Problems: COPD Exacerbation - Tapered prednisone to 40 mg - will cont for 3 more days- no wheezing yesterday or today --the patient was on Advair at home which she stopped- I have discussed in detail that she will need to resume this -continue albuterol inhaler at needed at home- patient asking for Nebs and is explained that she is too stable to need nebs at home - oxygen level with walking to the end of the hall and back is 97% on room air.   Diarrhea -C. difficile negative- resolved after admission  Dehydration -will d/c IV fluids today  Hypokalemia -May be due to diarrhea- have replaced  UTI? -No complaint of dysuria or other urological symptoms - 70,000 colonies of multiple bacterial morphotypes - antibiotics will be continued for above-mentioned pneumonia   Essential hypertension, benign -continue lisinopril/HCTZ   12/05/2014 1st [facility] office visit/  [facility]  / still on ACEi  Chief Complaint  Patient presents with  . HFU    Pt admitted for PNA to [facility] 11/13/14-11/15/14. She states that her breathing is no better since d/c.  She is using albuterol inhaler 4 x per day on average. She has some sort of neb sol at home that she rarely uses and it was not prescribed to her, it is her family members.   reports much wrose from advair / declined restarting it  Better today not using any inhaers but not at work walking much yet  / hasn't been able to do this easily for over a year due to sob> dry cough  >D/c ACE , rx Losartan  And Symbicort   12/19/2014 Follow up : COPD  Pt returns for 2 week follow up .  Last ov with pulmonary consult from recent admission for PNA  CXR showed cleared PNA.  Spirometry showed GOLD 3 COPD . She was started on Symbicort.  She was taken off of ACE and rx Losartan.  Says her cough is resolved.  Breathing is better.  She remains off smokes.  She denies chest pain, orthopnea, edema or hemoptysis,.  She is getting assistance at [employer] and wellness , needs rx for symbicort to take there as she has no insurance.     Current Medications, Allergies, Complete Past Medical History, Past Surgical History, Family History, and Social History were reviewed in [employer] record.  ROS  The following are not active complaints unless bolded sore throat, dysphagia, dental problems,  itching, sneezing,  nasal congestion or excess/ purulent secretions, ear ache,   fever, chills, sweats, unintended wt loss, pleuritic or exertional cp, hemoptysis,  orthopnea pnd or leg swelling, presyncope, palpitations, heartburn, abdominal pain, anorexia, nausea, vomiting, diarrhea  or change in bowel or urinary habits, change in stools or urine, dysuria,hematuria,  rash, arthralgias, visual complaints, headache, numbness weakness or ataxia or problems with walking or coordination,  change in mood/affect or memory.               Objective:   Physical Exam    amb nad    Vital signs reviewed   HEENT: nl dentition, turbinates, and orophanx. Nl external ear canals without cough reflex   NECK :  without JVD/Nodes/TM/ nl carotid upstrokes bilaterally   LUNGS: no acc muscle use, clear to A and P bilaterally without cough on insp or exp maneuvers   CV:  RRR  no s3 or murmur or increase in P2, no edema   ABD:  soft and nontender with nl excursion in the supine position. No bruits or organomegaly, bowel sounds nl  MS:  warm without deformities, calf tenderness, cyanosis or clubbing  SKIN: warm and dry without lesions    NEURO:  alert, approp, no deficits      I personally reviewed images and agree with radiology impression as follows:  CXR:  11/13/14  Left upper lobe pneumonia. I would recommend a followup post treatment chest x-ray in 3-4 weeks to re-evaluate.  CXR 12/05/14 1. Clearing of left upper lobe pneumonia. 2. Mild peribronchial thickening may indicate bronchitis. Assessment:

## 2014-12-19 NOTE — Assessment & Plan Note (Addendum)
Controlled on Hyzaar.   Plan  Would avoid ACE inhibitors in this pt if possible.  follow up PCP for b/p management .

## 2014-12-25 NOTE — Telephone Encounter (Signed)
-----   Message from Arnoldo Morale, MD sent at 12/13/2014 10:43 PM EDT ----- Previously elevated WBC have trended down

## 2014-12-25 NOTE — Telephone Encounter (Signed)
Nurse called all 3 numbers listed. Home number, voicemail answered, voicemail does not identify patient or number listed. No message left. Second number listed, mobile number, voicemail explains patient is not available at this time. Third number listed, mobile number, kept ringing, no voicemail or answer.

## 2014-12-28 NOTE — Telephone Encounter (Signed)
Nurse called patient, patient verified date of birth. Patient aware of WBC's coming down. Patient voices understanding and has no questions at this time.

## 2015-04-18 ENCOUNTER — Telehealth: Payer: Self-pay | Admitting: Internal Medicine

## 2015-04-18 NOTE — Telephone Encounter (Signed)
Patient called to request a med refill for budesonide-formoterol (SYMBICORT) 160-4.5 MCG/ACT inhaler and losartan-hydrochlorothiazide (HYZAAR) 50-12.5 MG per tablet. Please f/u with pt.

## 2015-04-20 ENCOUNTER — Emergency Department (INDEPENDENT_AMBULATORY_CARE_PROVIDER_SITE_OTHER)
Admission: EM | Admit: 2015-04-20 | Discharge: 2015-04-20 | Disposition: A | Discharge status: Home / Self Care | Payer: Self-pay | Source: Home / Self Care | Attending: Family Medicine | Admitting: Family Medicine

## 2015-04-20 ENCOUNTER — Encounter (HOSPITAL_COMMUNITY): Payer: Self-pay | Admitting: Emergency Medicine

## 2015-04-20 DIAGNOSIS — M542 Cervicalgia: Secondary | ICD-10-CM

## 2015-04-20 DIAGNOSIS — J449 Chronic obstructive pulmonary disease, unspecified: Secondary | ICD-10-CM

## 2015-04-20 DIAGNOSIS — I10 Essential (primary) hypertension: Secondary | ICD-10-CM

## 2015-04-20 LAB — POCT I-STAT, CHEM 8
BUN: 17 mg/dL (ref 6–20)
CALCIUM ION: 1.17 mmol/L (ref 1.12–1.23)
Chloride: 101 mmol/L (ref 101–111)
Creatinine, Ser: 0.8 mg/dL (ref 0.44–1.00)
GLUCOSE: 87 mg/dL (ref 65–99)
HEMATOCRIT: 52 % — AB (ref 36.0–46.0)
HEMOGLOBIN: 17.7 g/dL — AB (ref 12.0–15.0)
Potassium: 4.3 mmol/L (ref 3.5–5.1)
Sodium: 140 mmol/L (ref 135–145)
TCO2: 28 mmol/L (ref 0–100)

## 2015-04-20 MED ORDER — BUDESONIDE-FORMOTEROL FUMARATE 160-4.5 MCG/ACT IN AERO: INHALATION_SPRAY | RESPIRATORY_TRACT | Status: DC

## 2015-04-20 MED ORDER — LOSARTAN POTASSIUM-HCTZ 50-12.5 MG PO TABS: 1.0000 | ORAL_TABLET | Freq: Every day | ORAL | Status: DC

## 2015-04-20 NOTE — Discharge Instructions (Signed)
Chronic Obstructive Pulmonary Disease Chronic obstructive pulmonary disease (COPD) is a common lung problem. In COPD, the flow of air from the lungs is limited. The way your lungs work will probably never return to normal, but there are things you can do to improve your lungs and make yourself feel better. HOME CARE  Take all medicines as told by your doctor.  Avoid medicines or cough syrups that dry up your airway (such as antihistamines) and do not allow you to get rid of thick spit. You do not need to avoid them if told differently by your doctor.  If you smoke, stop. Smoking makes the problem worse.  Avoid being around things that make your breathing worse (like smoke, chemicals, and fumes).  Use oxygen therapy and therapy to help improve your lungs (pulmonary rehabilitation) if told by your doctor. If you need home oxygen therapy, ask your doctor if you should buy a tool to measure your oxygen level (oximeter).  Avoid people who have a sickness you can catch (contagious).  Avoid going outside when it is very hot, cold, or humid.  Eat healthy foods. Eat smaller meals more often. Rest before meals.  Stay active, but remember to also rest.  Make sure to get all the shots (vaccines) your doctor recommends. Ask your doctor if you need a pneumonia shot.  Learn and use tips on how to relax.  Learn and use tips on how to control your breathing as told by your doctor. Try:  Breathing in (inhaling) through your nose for 1 second. Then, pucker your lips and breath out (exhale) through your lips for 2 seconds.  Putting one hand on your belly (abdomen). Breathe in slowly through your nose for 1 second. Your hand on your belly should move out. Pucker your lips and breathe out slowly through your lips. Your hand on your belly should move in as you breathe out.  Learn and use controlled coughing to clear thick spit from your lungs. The steps are:  Lean your head a little forward.  Breathe in  deeply.  Try to hold your breath for 3 seconds.  Keep your mouth slightly open while coughing 2 times.  Spit any thick spit out into a tissue.  Rest and do the steps again 1 or 2 times as needed. GET HELP IF:  You cough up more thick spit than usual.  There is a change in the color or thickness of the spit.  It is harder to breathe than usual.  Your breathing is faster than usual. GET HELP RIGHT AWAY IF:   You have shortness of breath while resting.  You have shortness of breath that stops you from:  Being able to talk.  Doing normal activities.  You chest hurts for longer than 5 minutes.  Your skin color is more blue than usual.  Your pulse oximeter shows that you have low oxygen for longer than 5 minutes. MAKE SURE YOU:   Understand these instructions.  Will watch your condition.  Will get help right away if you are not doing well or get worse. Document Released: 01/28/2008 Document Revised: 12/26/2013 Document Reviewed: 04/07/2013 [facility] Patient Information 2015 [place], [place]. This information is not intended to replace advice given to you by your health care provider. Make sure you discuss any questions you have with your health care provider.  Hypertension Hypertension is another name for high blood pressure. High blood pressure forces your heart to work harder to pump blood. A blood pressure reading has  two numbers, which includes a higher number over a lower number (example: 110/72). HOME CARE   Have your blood pressure rechecked by your doctor.  Only take medicine as told by your doctor. Follow the directions carefully. The medicine does not work as well if you skip doses. Skipping doses also puts you at risk for problems.  Do not smoke.  Monitor your blood pressure at home as told by your doctor. GET HELP IF:  You think you are having a reaction to the medicine you are taking.  You have repeat headaches or feel dizzy.  You have puffiness  (swelling) in your ankles.  You have trouble with your vision. GET HELP RIGHT AWAY IF:   You get a very bad headache and are confused.  You feel weak, numb, or faint.  You get chest or belly (abdominal) pain.  You throw up (vomit).  You cannot breathe very well. MAKE SURE YOU:   Understand these instructions.  Will watch your condition.  Will get help right away if you are not doing well or get worse. Document Released: 01/28/2008 Document Revised: 08/16/2013 Document Reviewed: 06/03/2013 [facility] Patient Information 2015 [place], [place]. This information is not intended to replace advice given to you by your health care provider. Make sure you discuss any questions you have with your health care provider.    Your labs are good. Please go to the ER if you worsen. I think your headache and neck pain is musculoskeletal. F/U with your primary card doctors for management.

## 2015-04-20 NOTE — ED Provider Notes (Signed)
CSN: [id number]     Arrival date & time 04/20/15  1727 History   First MD Initiated Contact with Patient 04/20/15 1914     Chief Complaint  Patient presents with  . Headache  . Medication Refill   (Consider location/radiation/quality/duration/timing/severity/associated sxs/prior Treatment) HPI Comments: Patient presents for medication refills of Losartan and Symbicort.  She has been out for 1 month. She began to have a mild headache, but also some neck pain and stiffness with ROM over the last few days.  Mild "cracking" in the neck. No weakness. No change in vision. No dizziness. No weakness.  See remainder of ROS.   Patient is a 54 y.o. female presenting with headaches. The history is provided by the patient.  Headache Associated symptoms: neck pain   Associated symptoms: no fatigue, no fever, no myalgias, no numbness, no photophobia and no weakness     Past Medical History  Diagnosis Date  . Hypertension   . Asthma   . COPD (chronic obstructive pulmonary disease)   . Arthritis    Past Surgical History  Procedure Laterality Date  . No prior surgery     Family History  Problem Relation Age of Onset  . Hypertension Father   . Cancer Other   . Diabetes Other   . Hyperlipidemia Other   . Stroke Other   . Colon cancer Maternal Grandfather 77   Social History  Substance Use Topics  . Smoking status: Former Smoker -- 0.50 packs/day for 18 years    Types: Cigarettes    Quit date: 11/13/2014  . Smokeless tobacco: Never Used  . Alcohol Use: No   OB History    Gravida Para Term Preterm AB TAB SAB Ectopic Multiple Living   2         2     Review of Systems  Constitutional: Negative for fever and fatigue.  Eyes: Negative.  Negative for photophobia and visual disturbance.  Respiratory: Negative for shortness of breath.   Musculoskeletal: Positive for neck pain. Negative for myalgias and joint swelling.  Skin: Negative.   Allergic/Immunologic: Negative.   Neurological:  Positive for headaches. Negative for tremors, speech difficulty, weakness, light-headedness and numbness.  Psychiatric/Behavioral: Negative.     Allergies  Percocet  Home Medications   Prior to Admission medications   Medication Sig Start Date End Date Taking? Authorizing Provider  albuterol (PROVENTIL HFA;VENTOLIN HFA) 108 (90 BASE) MCG/ACT inhaler Inhale 2 puffs into the lungs every 4 (four) hours as needed for wheezing or shortness of breath. 11/15/14   Debbe Odea, MD  budesonide-formoterol (SYMBICORT) 160-4.5 MCG/ACT inhaler Take 2 puffs first thing in am and then another 2 puffs about 12 hours later. 04/20/15   Bjorn Pippin, PA-C  ibuprofen (ADVIL,MOTRIN) 200 MG tablet Take 200 mg by mouth every 6 (six) hours as needed.    Historical Provider, MD  losartan-hydrochlorothiazide (HYZAAR) 50-12.5 MG per tablet Take 1 tablet by mouth daily. 04/20/15   Bjorn Pippin, PA-C  metroNIDAZOLE (METROGEL VAGINAL) 0.75 % vaginal gel Place 1 Applicatorful vaginally 2 (two) times daily. 12/11/14   Arnoldo Morale, MD  naproxen sodium (ALEVE) 220 MG tablet Take 440 mg by mouth 2 (two) times daily as needed (for pain).    Historical Provider, MD   Meds Ordered and Administered this Visit  Medications - No data to display  BP 147/90 mmHg  Pulse 71  Temp(Src) 98.3 F (36.8 C) (Oral)  Resp 20  SpO2 97%  LMP 05/16/2013 No data found.  Physical Exam  Constitutional: She is oriented to person, place, and time. She appears well-developed and well-nourished. No distress.  HENT:  Head: Normocephalic and atraumatic.  Eyes: EOM are normal. Pupils are equal, round, and reactive to light. No scleral icterus.  Neck:  Mild pain with ROM of the cervical spine to the right, tenderness along the left SCM  Cardiovascular: Normal rate and regular rhythm.   Pulmonary/Chest: Effort normal and breath sounds normal.  Musculoskeletal:  See neck exam.   Neurological: She is alert and oriented to person, place,  and time. She exhibits normal muscle tone. Coordination normal.  Skin: Skin is warm and dry. She is not diaphoretic.  Psychiatric: Her behavior is normal.  Nursing note and vitals reviewed.   ED Course  Procedures (including critical care time)  Labs Review Labs Reviewed  POCT I-STAT, CHEM 8 - Abnormal; Notable for the following:    Hemoglobin 17.7 (*)    HCT 52.0 (*)    All other components within normal limits    Imaging Review No results found.   Visual Acuity Review  Right Eye Distance:   Left Eye Distance:   Bilateral Distance:    Right Eye Near:   Left Eye Near:    Bilateral Near:         MDM   1. Essential hypertension   2. Neck pain   3. COPD GOLD III    1. Patient is stable and can be d/c'ed. Renal functions and lytes normal. Ok to refill Losartan/HCTZ but must f/u with PCP within next few weeks for refills and evaluation.  2. Headaches/neck pain appears to be musculoskeletal by exam. No emergent needs. If this worsens she will go to the ER for further evaluation.  3. Refilled Symbicort. No respiratory needs. F/U with PCP    Bjorn Pippin, PA-C 04/20/15 2018

## 2015-04-20 NOTE — ED Notes (Signed)
C/o constant HA onset 3 days Reports she has not had her BP meds x1 month HA now is 9/10 Alert and oriented x4 No acute distress.

## 2015-05-03 ENCOUNTER — Encounter: Payer: Self-pay | Admitting: Internal Medicine

## 2015-05-03 ENCOUNTER — Ambulatory Visit: Payer: Self-pay | Attending: Internal Medicine | Admitting: Internal Medicine

## 2015-05-03 VITALS — BP 146/89 | HR 72 | Temp 98.8°F | Resp 16 | Ht 65.0 in | Wt 159.4 lb

## 2015-05-03 DIAGNOSIS — G44209 Tension-type headache, unspecified, not intractable: Secondary | ICD-10-CM

## 2015-05-03 DIAGNOSIS — K219 Gastro-esophageal reflux disease without esophagitis: Secondary | ICD-10-CM

## 2015-05-03 MED ORDER — OMEPRAZOLE 20 MG PO CPDR: 20.0000 mg | DELAYED_RELEASE_CAPSULE | Freq: Every day | ORAL | Status: DC

## 2015-05-03 MED ORDER — CYCLOBENZAPRINE HCL 10 MG PO TABS: 10.0000 mg | ORAL_TABLET | Freq: Three times a day (TID) | ORAL | Status: DC | PRN

## 2015-05-03 NOTE — Progress Notes (Signed)
Patient here for follow up from the ED Was seen at the urgent care for headache and medication refil Patient still complaining of having tension headaches and would like A referral to see the eye doctor

## 2015-05-03 NOTE — Patient Instructions (Addendum)
[facility]: Juluis Rainier OD ? Address: [address] [place], [place] [address] Phone: [phone] Hours: Open today  8AM-5:30P  Food Choices for Gastroesophageal Reflux Disease When you have gastroesophageal reflux disease (GERD), the foods you eat and your eating habits are very important. Choosing the right foods can help ease the discomfort of GERD. WHAT GENERAL GUIDELINES DO I NEED TO FOLLOW?  Choose fruits, vegetables, whole grains, low-fat dairy products, and low-fat meat, fish, and poultry.  Limit fats such as oils, salad dressings, butter, nuts, and avocado.  Keep a food diary to identify foods that cause symptoms.  Avoid foods that cause reflux. These may be different for different people.  Eat frequent small meals instead of three large meals each day.  Eat your meals slowly, in a relaxed setting.  Limit fried foods.  Cook foods using methods other than frying.  Avoid drinking alcohol.  Avoid drinking large amounts of liquids with your meals.  Avoid bending over or lying down until 2-3 hours after eating. WHAT FOODS ARE NOT RECOMMENDED? The following are some foods and drinks that may worsen your symptoms: Vegetables Tomatoes. Tomato juice. Tomato and spaghetti sauce. Chili peppers. Onion and garlic. Horseradish. Fruits Oranges, grapefruit, and lemon (fruit and juice). Meats High-fat meats, fish, and poultry. This includes hot dogs, ribs, ham, sausage, salami, and bacon. Dairy Whole milk and chocolate milk. Sour cream. Cream. Butter. Ice cream. Cream cheese.  Beverages Coffee and tea, with or without caffeine. Carbonated beverages or energy drinks. Condiments Hot sauce. Barbecue sauce.  Sweets/Desserts Chocolate and cocoa. Donuts. Peppermint and spearmint. Fats and Oils High-fat foods, including [place] fries and potato chips. Other Vinegar. Strong spices, such as black pepper, white pepper, red pepper, cayenne, curry powder, cloves, ginger,  and chili powder. The items listed above may not be a complete list of foods and beverages to avoid. Contact your dietitian for more information. Document Released: 08/11/2005 Document Revised: 08/16/2013 Document Reviewed: 06/15/2013 [facility] Patient Information 2015 [place], [place]. This information is not intended to replace advice given to you by your health care provider. Make sure you discuss any questions you have with your health care provider.

## 2015-05-03 NOTE — Progress Notes (Signed)
Patient ID: Amber Shaw, female   DOB: 10-03-1960, 54 y.o.   MRN: [id number]  CC: headaches, eye referral   HPI: Amber Shaw is a 54 y.o. female here today for a follow up visit.  Patient has past medical history of hypertension, asthma, COPD , arthritis.  Patient was seen in urgent care on 8/26 for neck pain/headaches and medication refills.  Today patient reports she is continuing to have headaches located in her bilateral temples that radiate to her forehead and back of neck. She feels like it is tension.   Headaches described as a throbbing pain that usually last all day if she does not take any pain medication. Has been having poor appetite and not sure if that is a trigger. Associated symptoms include neck soreness, blurred vision. She denies symptoms of photophobia, nausea, and vomiting.  She has tried tylenol for pain with mild relief.  Patient also reports she has been out of blood pressure medication for over one month. She feels like she needs glasses. She has to read small numbers all day and often gets a headache after straining her eyes.    Allergies  Allergen Reactions  . Percocet [Oxycodone-Acetaminophen] Other (See Comments)    Painful urination    Past Medical History  Diagnosis Date  . Hypertension   . Asthma   . COPD (chronic obstructive pulmonary disease)   . Arthritis    Current Outpatient Prescriptions on File Prior to Visit  Medication Sig Dispense Refill  . albuterol (PROVENTIL HFA;VENTOLIN HFA) 108 (90 BASE) MCG/ACT inhaler Inhale 2 puffs into the lungs every 4 (four) hours as needed for wheezing or shortness of breath. 3 Inhaler 3  . budesonide-formoterol (SYMBICORT) 160-4.5 MCG/ACT inhaler Take 2 puffs first thing in am and then another 2 puffs about 12 hours later. 10.2 g 1  . ibuprofen (ADVIL,MOTRIN) 200 MG tablet Take 200 mg by mouth every 6 (six) hours as needed.    Marland Kitchen losartan-hydrochlorothiazide (HYZAAR) 50-12.5 MG per tablet Take 1 tablet by mouth daily.  30 tablet 1  . naproxen sodium (ALEVE) 220 MG tablet Take 440 mg by mouth 2 (two) times daily as needed (for pain).    . metroNIDAZOLE (METROGEL VAGINAL) 0.75 % vaginal gel Place 1 Applicatorful vaginally 2 (two) times daily. (Patient not taking: Reported on 05/03/2015) 70 g 0   No current facility-administered medications on file prior to visit.   Family History  Problem Relation Age of Onset  . Hypertension Father   . Cancer Other   . Diabetes Other   . Hyperlipidemia Other   . Stroke Other   . Colon cancer Maternal Grandfather 45   Social History   Social History  . Marital Status: Single    Spouse Name: N/A  . Number of Children: N/A  . Years of Education: N/A   Occupational History  . Not on file.   Social History Main Topics  . Smoking status: Former Smoker -- 0.50 packs/day for 18 years    Types: Cigarettes    Quit date: 11/13/2014  . Smokeless tobacco: Never Used  . Alcohol Use: No  . Drug Use: No  . Sexual Activity: Not on file   Other Topics Concern  . Not on file   Social History Narrative   Review of Systems  Eyes: Positive for blurred vision.  Gastrointestinal: Positive for heartburn.  Neurological: Positive for headaches. Negative for dizziness and tingling.  All other systems reviewed and are negative.    Objective:  Filed Vitals:   05/03/15 1028  BP: 146/89  Pulse: 72  Temp: 98.8 F (37.1 C)  Resp: 16    Physical Exam  Constitutional: She is oriented to person, place, and time.  Cardiovascular: Normal rate, regular rhythm and normal heart sounds.   Pulmonary/Chest: Effort normal and breath sounds normal.  Musculoskeletal: She exhibits no edema.  Neurological: She is alert and oriented to person, place, and time. No cranial nerve deficit. Coordination normal.  Skin: Skin is warm and dry.  Psychiatric: She has a normal mood and affect.  '  Lab Results  Component Value Date   WBC 3.4* 12/11/2014   HGB 17.7* 04/20/2015   HCT 52.0*  04/20/2015   MCV 91.4 12/11/2014   PLT 242 12/11/2014   Lab Results  Component Value Date   CREATININE 0.80 04/20/2015   BUN 17 04/20/2015   NA 140 04/20/2015   K 4.3 04/20/2015   CL 101 04/20/2015   CO2 26 11/15/2014    Lab Results  Component Value Date   HGBA1C 5.4 03/20/2014   Lipid Panel     Component Value Date/Time   CHOL 183 03/20/2014 1034   TRIG 92 03/20/2014 1034   HDL 61 03/20/2014 1034   CHOLHDL 3.0 03/20/2014 1034   VLDL 18 03/20/2014 1034   LDLCALC 104* 03/20/2014 1034       Assessment and plan:   Amber Shaw was seen today for follow-up.  Diagnoses and all orders for this visit:  Tension headache -     Begin cyclobenzaprine (FLEXERIL) 10 MG tablet; Take 1 tablet (10 mg total) by mouth 3 (three) times daily as needed for muscle spasms. May take 0.5 tablet if it makes you too drowsy Patient will take Flexeril as soon as she notices a headache and keep a headache diary.  Gastroesophageal reflux disease, esophagitis presence not specified -     Begin omeprazole (PRILOSEC) 20 MG capsule; Take 1 capsule (20 mg total) by mouth daily. Discussed diet and weight with patient relating to acid reflux.  Went over things that may exacerbate acid reflux such as tomatoes, spicy foods, coffee, carbonated beverages, chocolates, etc.  Advised patient to avoid laying down at least two hours after meals and sleep with HOB elevated.    Return if symptoms worsen or fail to improve.        Lance Bosch, [facility] [phone] 05/03/2015, 10:35 AM

## 2015-06-28 ENCOUNTER — Telehealth: Payer: Self-pay | Admitting: Internal Medicine

## 2015-06-28 NOTE — Telephone Encounter (Signed)
Pt. Is experiencing difficulty with her vision, she believes that it might be due to her medication....please advised.Marland KitchenMarland Kitchen

## 2015-07-02 ENCOUNTER — Telehealth: Payer: Self-pay

## 2015-07-02 NOTE — Telephone Encounter (Signed)
Returned patient phone call Patient is requesting an appointment for the orange card Call transferred to front desk to schedule

## 2015-07-16 ENCOUNTER — Ambulatory Visit: Payer: Self-pay

## 2015-09-10 ENCOUNTER — Other Ambulatory Visit: Payer: Self-pay | Admitting: Internal Medicine

## 2015-09-10 ENCOUNTER — Other Ambulatory Visit: Payer: Self-pay | Admitting: Pharmacist

## 2015-09-10 MED ORDER — LOSARTAN POTASSIUM-HCTZ 50-12.5 MG PO TABS: 1.0000 | ORAL_TABLET | Freq: Every day | ORAL | Status: DC

## 2015-09-10 MED FILL — LOSARTAN-HCTZ 50-12.5 MG TA: 50-12.5 | 30 days supply | Qty: 30 | Fill #2

## 2015-10-22 MED FILL — ?LOSARTAN-HCTZ 50-12.5 MG T: 50-12.5 | 30 days supply | Qty: 30 | Fill #3

## 2015-11-23 ENCOUNTER — Emergency Department (HOSPITAL_COMMUNITY)
Admission: EM | Admit: 2015-11-23 | Discharge: 2015-11-23 | Disposition: A | Discharge status: Home / Self Care | Payer: Self-pay | Attending: Emergency Medicine | Admitting: Emergency Medicine

## 2015-11-23 ENCOUNTER — Emergency Department (HOSPITAL_COMMUNITY): Payer: Self-pay

## 2015-11-23 ENCOUNTER — Encounter (HOSPITAL_COMMUNITY): Payer: Self-pay | Admitting: Emergency Medicine

## 2015-11-23 DIAGNOSIS — Z79899 Other long term (current) drug therapy: Secondary | ICD-10-CM | POA: Insufficient documentation

## 2015-11-23 DIAGNOSIS — J069 Acute upper respiratory infection, unspecified: Secondary | ICD-10-CM | POA: Insufficient documentation

## 2015-11-23 DIAGNOSIS — I10 Essential (primary) hypertension: Secondary | ICD-10-CM | POA: Insufficient documentation

## 2015-11-23 DIAGNOSIS — Z87891 Personal history of nicotine dependence: Secondary | ICD-10-CM | POA: Insufficient documentation

## 2015-11-23 DIAGNOSIS — J449 Chronic obstructive pulmonary disease, unspecified: Secondary | ICD-10-CM | POA: Insufficient documentation

## 2015-11-23 DIAGNOSIS — M1990 Unspecified osteoarthritis, unspecified site: Secondary | ICD-10-CM | POA: Insufficient documentation

## 2015-11-23 MED ORDER — SODIUM CHLORIDE 0.9 % IV BOLUS (SEPSIS)
1000.0000 mL | Freq: Once | INTRAVENOUS | Status: AC
Start: 2015-11-23 — End: 2015-11-23
  Administered 2015-11-23: 1000 mL via INTRAVENOUS

## 2015-11-23 MED ORDER — KETOROLAC TROMETHAMINE 15 MG/ML IJ SOLN
15.0000 mg | Freq: Once | INTRAMUSCULAR | Status: AC
  Administered 2015-11-23: 15 mg via INTRAVENOUS
  Filled 2015-11-23: qty 1

## 2015-11-23 MED ORDER — IBUPROFEN 800 MG PO TABS
800.0000 mg | ORAL_TABLET | Freq: Once | ORAL | Status: AC
  Administered 2015-11-23: 800 mg via ORAL
  Filled 2015-11-23: qty 1

## 2015-11-23 MED ORDER — DIPHENHYDRAMINE HCL 50 MG/ML IJ SOLN
25.0000 mg | Freq: Once | INTRAMUSCULAR | Status: AC
  Administered 2015-11-23: 25 mg via INTRAVENOUS
  Filled 2015-11-23: qty 1

## 2015-11-23 MED ORDER — METOCLOPRAMIDE HCL 5 MG/ML IJ SOLN
10.0000 mg | Freq: Once | INTRAMUSCULAR | Status: AC
  Administered 2015-11-23: 10 mg via INTRAVENOUS
  Filled 2015-11-23: qty 2

## 2015-11-23 MED ORDER — AMPICILLIN-SULBACTAM SODIUM 3 (2-1) G IJ SOLR: 3.0000 g | Freq: Once | INTRAMUSCULAR | Status: DC

## 2015-11-23 MED ORDER — OSELTAMIVIR PHOSPHATE 75 MG PO CAPS: 75.0000 mg | ORAL_CAPSULE | Freq: Two times a day (BID) | ORAL | Status: DC

## 2015-11-23 NOTE — ED Notes (Signed)
Per EMS pt is c/o flu like symptoms since 1 pm yesterday and is now c/o severe headache since an hour ago  Pt states she took tylenol and 45 minutes later when she had no relief she called 911

## 2015-11-23 NOTE — ED Provider Notes (Signed)
CSN: [id number]     Arrival date & time 11/23/15  [id number] History   First MD Initiated Contact with Patient 11/23/15 [phone]     Chief Complaint  Patient presents with  . Headache  . Influenza   HPI   Amber Shaw is a 55 y.o. female PMH significant for asthma, HTN, COPD presenting with a 1 hour history of headache as well as cough, nasal congestion, body aches, fevers since 13:00 yesterday. She describes her headache as a 3 out of 10 pain scale currently. She states she took Tylenol at home and this provided minimal relief but she called 911 when her symptoms did not resolve. While in the ED, she has received 800 mg of ibuprofen She endorses ill contacts. She denies chest pain, shortness of breath, abdominal pain, nausea, vomiting, change in bowel or bladder habits.  Past Medical History  Diagnosis Date  . Hypertension   . Asthma   . COPD (chronic obstructive pulmonary disease) ([place])   . Arthritis    Past Surgical History  Procedure Laterality Date  . No prior surgery     Family History  Problem Relation Age of Onset  . Hypertension Father   . CAD Father   . Cancer Other   . Diabetes Other   . Hyperlipidemia Other   . Stroke Other   . Colon cancer Maternal Grandfather 25  . Cancer Mother    Social History  Substance Use Topics  . Smoking status: Former Smoker -- 0.50 packs/day for 18 years    Types: Cigarettes    Quit date: 11/13/2014  . Smokeless tobacco: Never Used  . Alcohol Use: No   OB History    Gravida Para Term Preterm AB TAB SAB Ectopic Multiple Living   2         2     Review of Systems  Ten systems are reviewed and are negative for acute change except as noted in the HPI  Allergies  Percocet  Home Medications   Prior to Admission medications   Medication Sig Start Date End Date Taking? Authorizing Provider  albuterol (PROVENTIL HFA;VENTOLIN HFA) 108 (90 BASE) MCG/ACT inhaler Inhale 2 puffs into the lungs every 4 (four) hours as needed for wheezing or  shortness of breath. 11/15/14  Yes Debbe Odea, MD  ibuprofen (ADVIL,MOTRIN) 200 MG tablet Take 200 mg by mouth every 6 (six) hours as needed.   Yes Historical Provider, MD  losartan-hydrochlorothiazide (HYZAAR) 50-12.5 MG tablet Take 1 tablet by mouth daily. Needs office visit 09/10/15  Yes Tresa Garter, MD  naproxen sodium (ALEVE) 220 MG tablet Take 440 mg by mouth 2 (two) times daily as needed (for pain).   Yes Historical Provider, MD  budesonide-formoterol (SYMBICORT) 160-4.5 MCG/ACT inhaler Take 2 puffs first thing in am and then another 2 puffs about 12 hours later. Patient not taking: Reported on 11/23/2015 04/20/15   Bjorn Pippin, PA-C  cyclobenzaprine (FLEXERIL) 10 MG tablet Take 1 tablet (10 mg total) by mouth 3 (three) times daily as needed for muscle spasms. May take 0.5 tablet if it makes you too drowsy Patient not taking: Reported on 11/23/2015 05/03/15   Lance Bosch, NP  omeprazole (PRILOSEC) 20 MG capsule Take 1 capsule (20 mg total) by mouth daily. Patient not taking: Reported on 11/23/2015 05/03/15   Lance Bosch, NP   BP 96/57 mmHg  Pulse 66  Temp(Src) 98.1 F (36.7 C) (Oral)  Resp 22  SpO2 97%  LMP 05/16/2013 Physical  Exam  Constitutional: She is oriented to person, place, and time. She appears well-developed and well-nourished. No distress.  HENT:  Head: Normocephalic and atraumatic.  Right Ear: External ear normal.  Left Ear: External ear normal.  Nose: Nose normal.  Mouth/Throat: Oropharynx is clear and moist. No oropharyngeal exudate.  Bilateral tympanic membranes without bulging, erythema  Eyes: Conjunctivae are normal. Pupils are equal, round, and reactive to light. Right eye exhibits no discharge. Left eye exhibits no discharge. No scleral icterus.  Neck: Normal range of motion. Neck supple. No tracheal deviation present.  Cardiovascular: Normal rate, regular rhythm, normal heart sounds and intact distal pulses.  Exam reveals no gallop and no friction  rub.   No murmur heard. Pulmonary/Chest: Effort normal and breath sounds normal. No respiratory distress. She has no wheezes. She has no rales. She exhibits no tenderness.  Abdominal: Soft. Bowel sounds are normal. She exhibits no distension and no mass. There is no tenderness. There is no rebound and no guarding.  Musculoskeletal: Normal range of motion. She exhibits no edema.  Lymphadenopathy:    She has no cervical adenopathy.  Neurological: She is alert and oriented to person, place, and time. No cranial nerve deficit. Coordination normal.  Cranial nerves II through XII intact. Normal finger to nose, RAM, pronator drift  Skin: Skin is warm and dry. No rash noted. She is not diaphoretic. No erythema.  Psychiatric: She has a normal mood and affect. Her behavior is normal.  Nursing note and vitals reviewed.   ED Course  Procedures  Imaging Review Dg Chest 2 View  11/23/2015  CLINICAL DATA:  Cough and congestion. EXAM: CHEST  2 VIEW COMPARISON:  December 05, 2014 FINDINGS: Lungs are clear. Heart size and pulmonary vascularity are normal. No adenopathy. There is degenerative change in the thoracic spine. IMPRESSION: No edema or consolidation. Electronically Signed   By: Lowella Grip III M.D.   On: 11/23/2015 07:49   I have personally reviewed and evaluated these images and lab results as part of my medical decision-making.  MDM   Final diagnoses:  Upper respiratory infection   Patient nontoxic-appearing, vital signs stable. She states she's feeling better but will provide headache cocktail, get chest x-ray and reassess. Chest x-ray unremarkable for acute change. Pt CXR negative for acute infiltrate. Patients symptoms are consistent with URI, likely viral etiology. Discussed that antibiotics are not indicated for viral infections. Pt will be discharged with symptomatic treatment.  Verbalizes understanding and is agreeable with plan. Pt is hemodynamically stable & in NAD prior to  dc.  Fannin Lions, PA-C 11/23/15 [place], MD 11/23/15 2014

## 2015-11-23 NOTE — Discharge Instructions (Signed)
Ms. Amber Shaw,  Nice meeting you! Please follow-up with your primary care provider. Return to the emergency department if you develop increased headache, inability to keep foods down, chest pain, shortness of breath, or do not improve within a few days. Feel better soon!  S. Wendie Simmer, PA-C Upper Respiratory Infection, Adult Most upper respiratory infections (URIs) are a viral infection of the air passages leading to the lungs. A URI affects the nose, throat, and upper air passages. The most common type of URI is nasopharyngitis and is typically referred to as "the common cold." URIs run their course and usually go away on their own. Most of the time, a URI does not require medical attention, but sometimes a bacterial infection in the upper airways can follow a viral infection. This is called a secondary infection. Sinus and middle ear infections are common types of secondary upper respiratory infections. Bacterial pneumonia can also complicate a URI. A URI can worsen asthma and chronic obstructive pulmonary disease (COPD). Sometimes, these complications can require emergency medical care and may be life threatening.  CAUSES Almost all URIs are caused by viruses. A virus is a type of germ and can spread from one person to another.  RISKS FACTORS You may be at risk for a URI if:   You smoke.   You have chronic heart or lung disease.  You have a weakened defense (immune) system.   You are very young or very old.   You have nasal allergies or asthma.  You work in crowded or poorly ventilated areas.  You work in health care facilities or schools. SIGNS AND SYMPTOMS  Symptoms typically develop 2-3 days after you come in contact with a cold virus. Most viral URIs last 7-10 days. However, viral URIs from the influenza virus (flu virus) can last 14-18 days and are typically more severe. Symptoms may include:   Runny or stuffy (congested) nose.   Sneezing.   Cough.   Sore  throat.   Headache.   Fatigue.   Fever.   Loss of appetite.   Pain in your forehead, behind your eyes, and over your cheekbones (sinus pain).  Muscle aches.  DIAGNOSIS  Your health care provider may diagnose a URI by:  Physical exam.  Tests to check that your symptoms are not due to another condition such as:  Strep throat.  Sinusitis.  Pneumonia.  Asthma. TREATMENT  A URI goes away on its own with time. It cannot be cured with medicines, but medicines may be prescribed or recommended to relieve symptoms. Medicines may help:  Reduce your fever.  Reduce your cough.  Relieve nasal congestion. HOME CARE INSTRUCTIONS   Take medicines only as directed by your health care provider.   Gargle warm saltwater or take cough drops to comfort your throat as directed by your health care provider.  Use a warm mist humidifier or inhale steam from a shower to increase air moisture. This may make it easier to breathe.  Drink enough fluid to keep your urine clear or pale yellow.   Eat soups and other clear broths and maintain good nutrition.   Rest as needed.   Return to work when your temperature has returned to normal or as your health care provider advises. You may need to stay home longer to avoid infecting others. You can also use a face mask and careful hand washing to prevent spread of the virus.  Increase the usage of your inhaler if you have asthma.   Do  not use any tobacco products, including cigarettes, chewing tobacco, or electronic cigarettes. If you need help quitting, ask your health care provider. PREVENTION  The best way to protect yourself from getting a cold is to practice good hygiene.   Avoid oral or hand contact with people with cold symptoms.   Wash your hands often if contact occurs.  There is no clear evidence that vitamin C, vitamin E, echinacea, or exercise reduces the chance of developing a cold. However, it is always recommended to  get plenty of rest, exercise, and practice good nutrition.  SEEK MEDICAL CARE IF:   You are getting worse rather than better.   Your symptoms are not controlled by medicine.   You have chills.  You have worsening shortness of breath.  You have brown or red mucus.  You have yellow or brown nasal discharge.  You have pain in your face, especially when you bend forward.  You have a fever.  You have swollen neck glands.  You have pain while swallowing.  You have white areas in the back of your throat. SEEK IMMEDIATE MEDICAL CARE IF:   You have severe or persistent:  Headache.  Ear pain.  Sinus pain.  Chest pain.  You have chronic lung disease and any of the following:  Wheezing.  Prolonged cough.  Coughing up blood.  A change in your usual mucus.  You have a stiff neck.  You have changes in your:  Vision.  Hearing.  Thinking.  Mood. MAKE SURE YOU:   Understand these instructions.  Will watch your condition.  Will get help right away if you are not doing well or get worse.   This information is not intended to replace advice given to you by your health care provider. Make sure you discuss any questions you have with your health care provider.   Document Released: 02/04/2001 Document Revised: 12/26/2014 Document Reviewed: 11/16/2013 Elsevier Interactive Patient Education [employer].

## 2015-11-28 MED FILL — ?LOSARTAN-HCTZ 50-12.5 MG T: 50-12.5 | 30 days supply | Qty: 30 | Fill #4

## 2016-04-18 IMAGING — CR DG CHEST 2V
2 series · 2 of 2 positions shown · non-contrast
Comparison: June 06, 2013

CLINICAL DATA: Difficulty breathing and cough

EXAM:
CHEST  2 VIEW

[w chest pa]
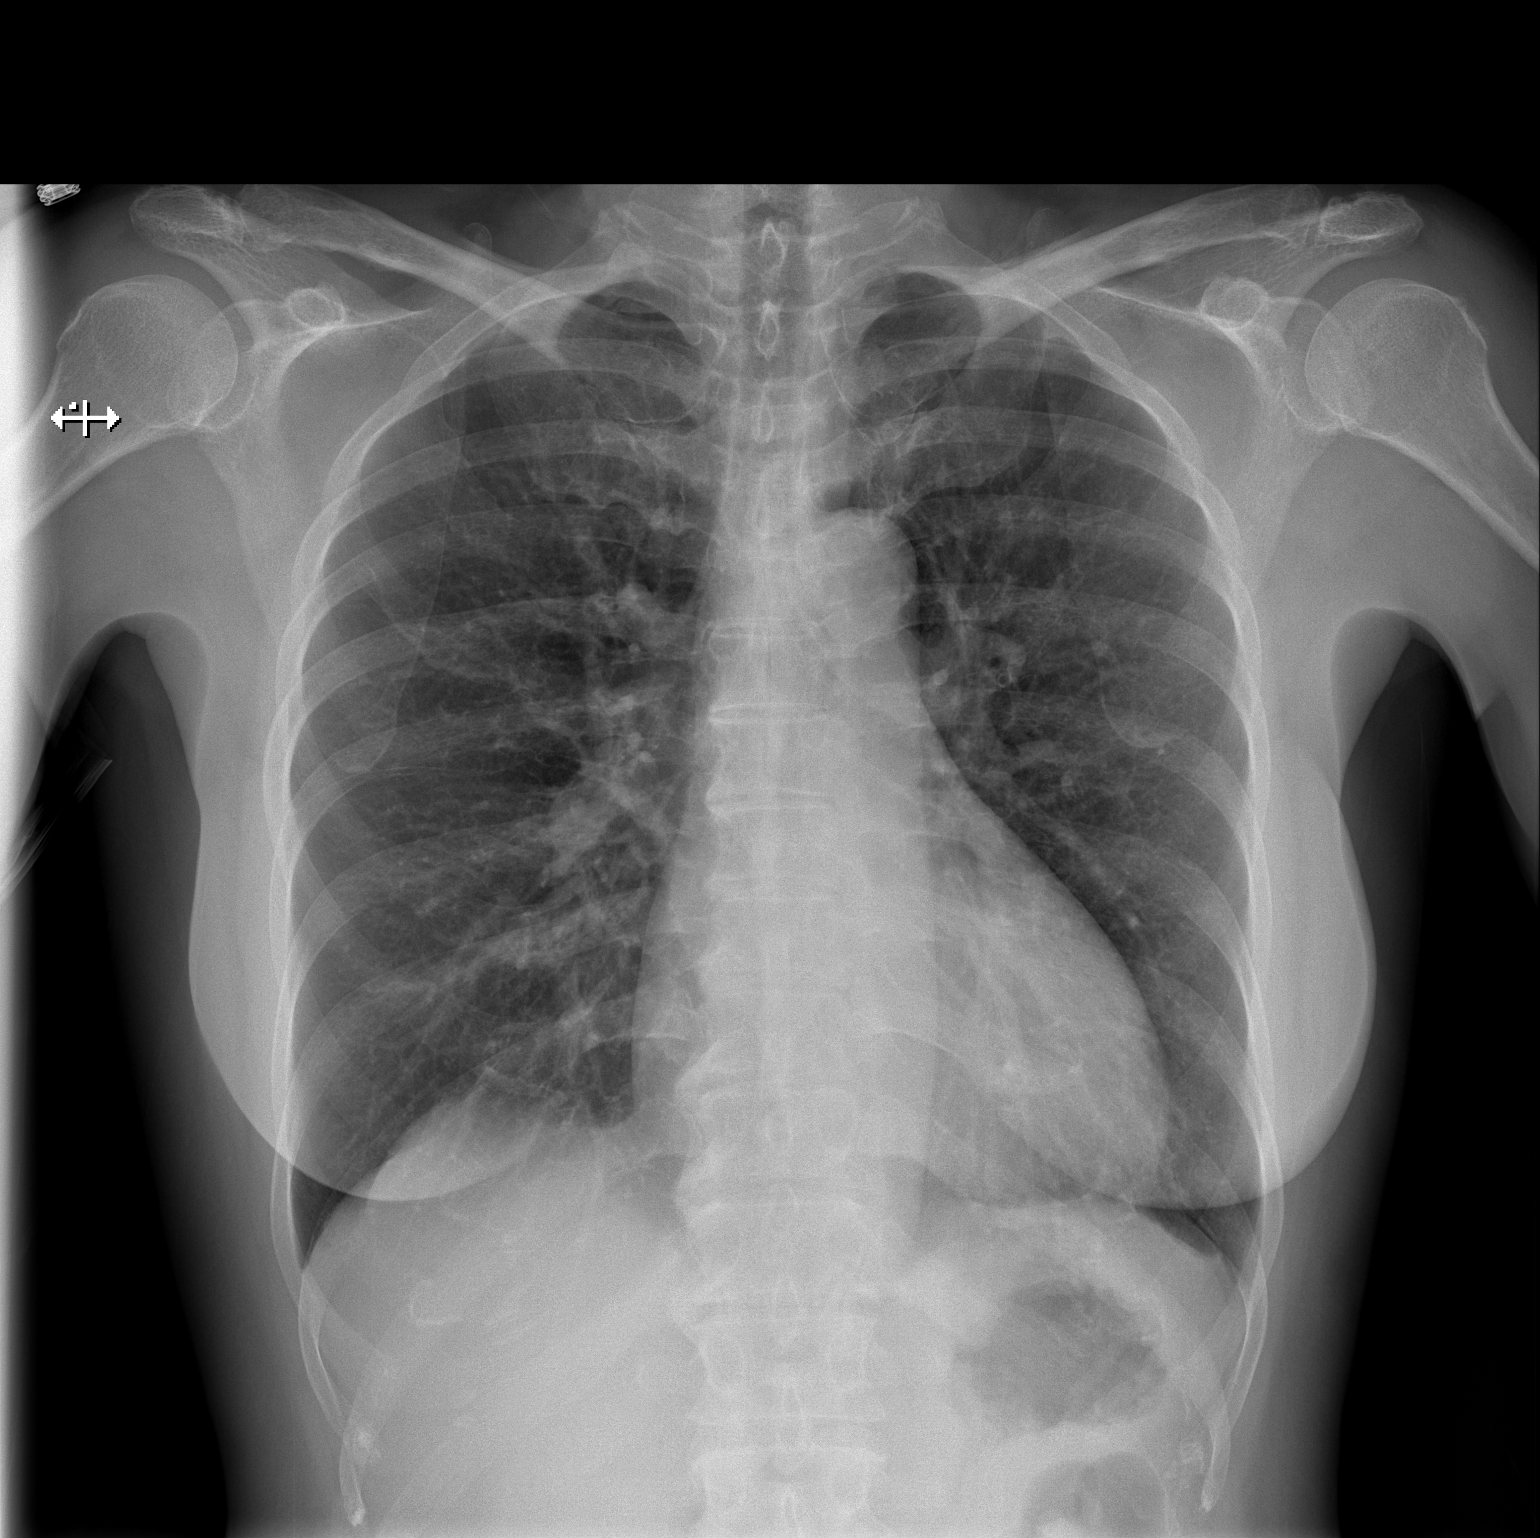

[w chest lat]
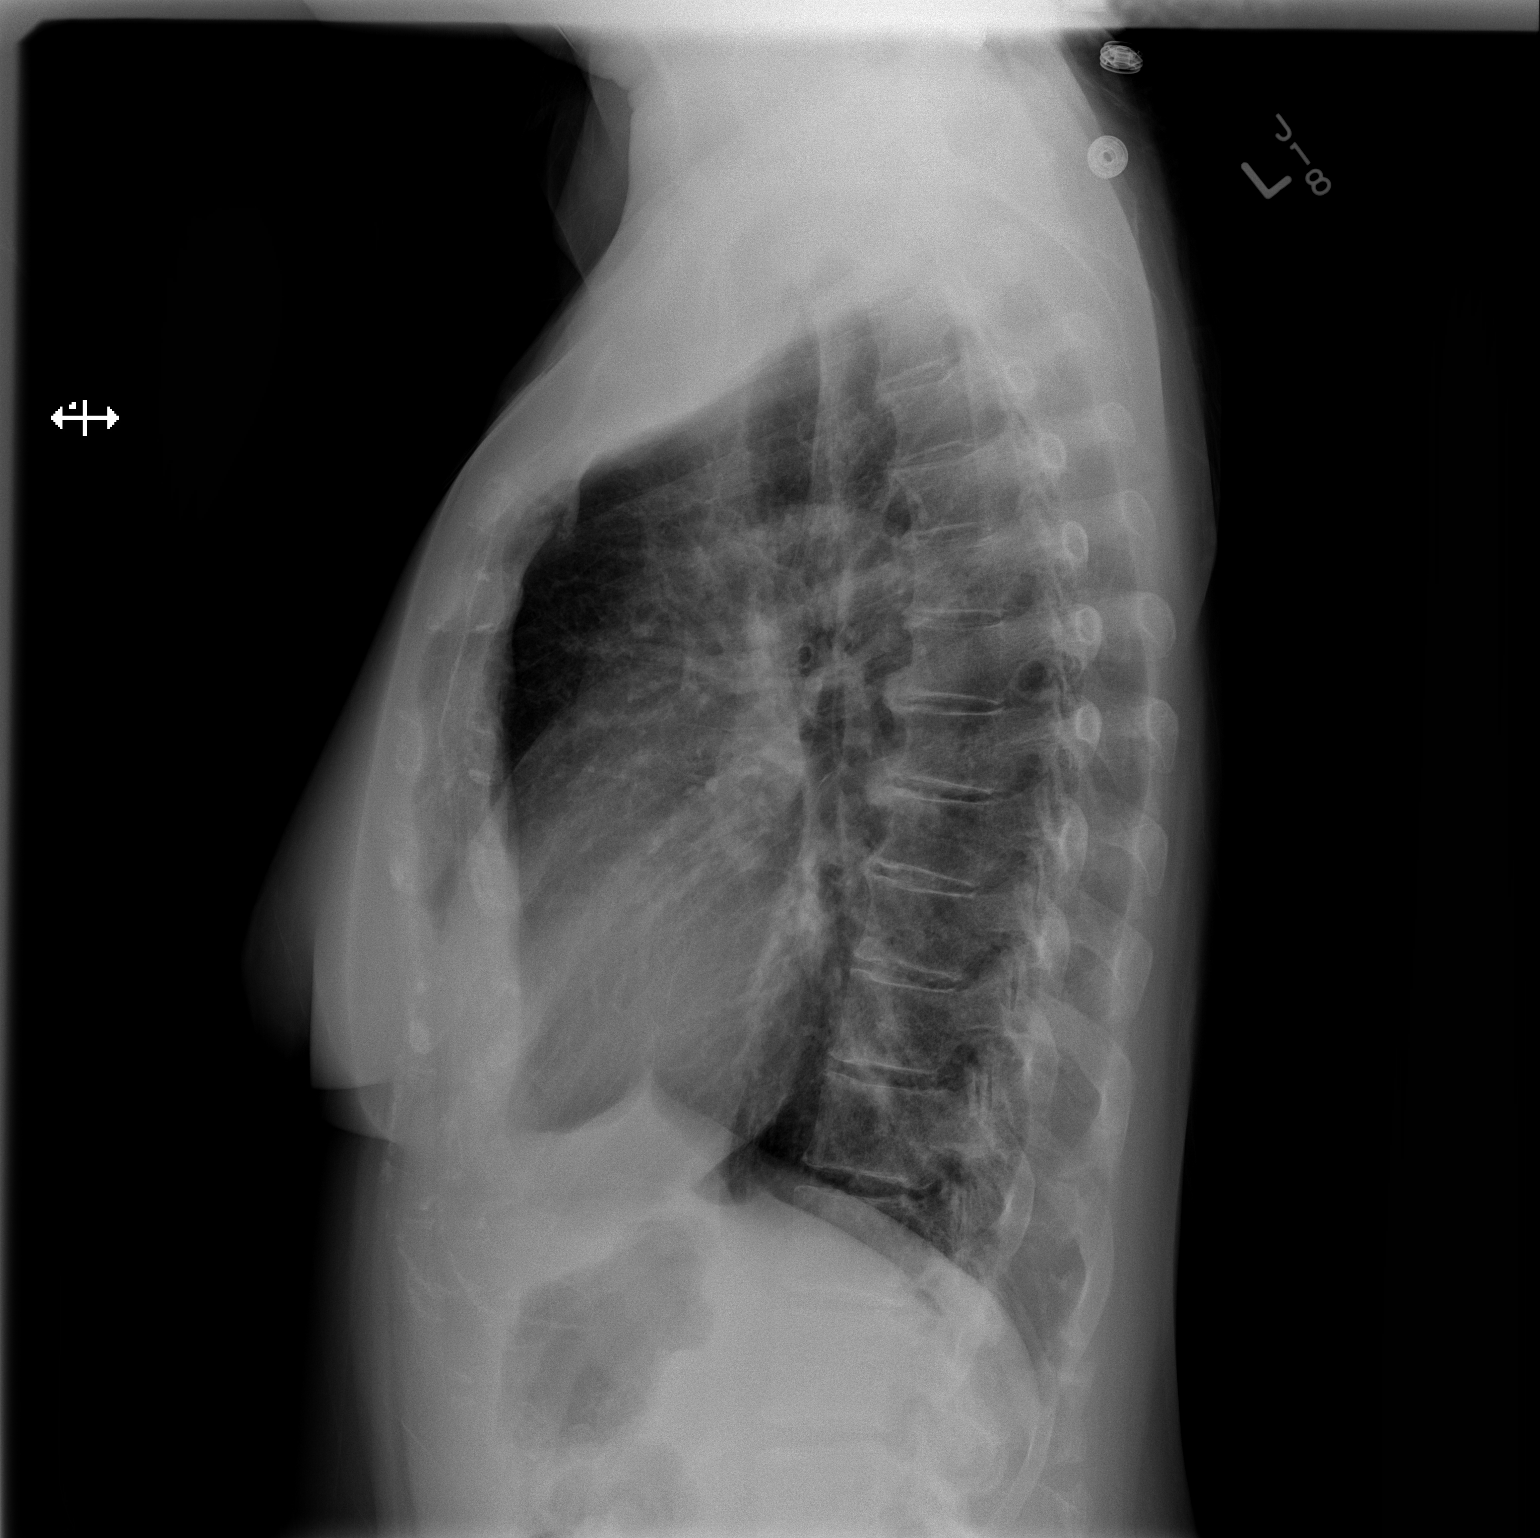

[2 of 2 positions shown; findings below may reference images not displayed]

FINDINGS: There is no edema or consolidation. Heart size and the pulmonary
vascularity are within normal limits. No adenopathy. There is mild
degenerative change in the thoracic spine.
IMPRESSION: No edema or consolidation.

## 2016-07-29 ENCOUNTER — Telehealth: Payer: Self-pay | Admitting: Internal Medicine

## 2016-07-29 NOTE — Telephone Encounter (Signed)
Patient called requesting refills on medication:  losartan-hydrochlorothiazide (HYZAAR) 50-12.5 MG tablet   albuterol (PROVENTIL HFA;VENTOLIN HFA) 108 (90 BASE) MCG/ACT inhaler       Please send to  [facility]  Pharmacy:   Please follow up with patient.

## 2016-07-30 NOTE — Telephone Encounter (Signed)
Patient must be seen and established with provider before any refills can be given.

## 2016-08-14 ENCOUNTER — Encounter: Payer: Self-pay | Admitting: Licensed Clinical Social Worker

## 2016-08-14 ENCOUNTER — Encounter: Payer: Self-pay | Admitting: Internal Medicine

## 2016-08-14 ENCOUNTER — Ambulatory Visit: Payer: Self-pay | Attending: Internal Medicine | Admitting: Internal Medicine

## 2016-08-14 VITALS — BP 126/80 | HR 68 | Temp 97.7°F | Resp 18 | Ht 65.0 in | Wt 175.8 lb

## 2016-08-14 DIAGNOSIS — Z1211 Encounter for screening for malignant neoplasm of colon: Secondary | ICD-10-CM

## 2016-08-14 DIAGNOSIS — Z23 Encounter for immunization: Secondary | ICD-10-CM

## 2016-08-14 DIAGNOSIS — J449 Chronic obstructive pulmonary disease, unspecified: Secondary | ICD-10-CM | POA: Insufficient documentation

## 2016-08-14 DIAGNOSIS — Z1239 Encounter for other screening for malignant neoplasm of breast: Secondary | ICD-10-CM

## 2016-08-14 DIAGNOSIS — F419 Anxiety disorder, unspecified: Secondary | ICD-10-CM | POA: Insufficient documentation

## 2016-08-14 DIAGNOSIS — I10 Essential (primary) hypertension: Secondary | ICD-10-CM | POA: Insufficient documentation

## 2016-08-14 DIAGNOSIS — K219 Gastro-esophageal reflux disease without esophagitis: Secondary | ICD-10-CM | POA: Insufficient documentation

## 2016-08-14 DIAGNOSIS — Z124 Encounter for screening for malignant neoplasm of cervix: Secondary | ICD-10-CM | POA: Insufficient documentation

## 2016-08-14 DIAGNOSIS — Z1231 Encounter for screening mammogram for malignant neoplasm of breast: Secondary | ICD-10-CM

## 2016-08-14 LAB — LIPID PANEL
CHOL/HDL RATIO: 3.2 ratio (ref <5.0)
CHOLESTEROL: 223 mg/dL — AB (ref <200)
HDL: 70 mg/dL (ref >50)
LDL Cholesterol: 135 mg/dL — ABNORMAL HIGH (ref <100)
TRIGLYCERIDES: 92 mg/dL (ref <150)
VLDL: 18 mg/dL (ref <30)

## 2016-08-14 LAB — CBC WITH DIFFERENTIAL/PLATELET
BASOS ABS: 0 {cells}/uL (ref 0–200)
Basophils Relative: 0 %
Eosinophils Absolute: 29 cells/uL (ref 15–500)
Eosinophils Relative: 1 %
HEMATOCRIT: 45.1 % — AB (ref 35.0–45.0)
HEMOGLOBIN: 14.5 g/dL (ref 11.7–15.5)
LYMPHS PCT: 54 %
Lymphs Abs: 1566 cells/uL (ref 850–3900)
MCH: 30.5 pg (ref 27.0–33.0)
MCHC: 32.2 g/dL (ref 32.0–36.0)
MCV: 94.9 fL (ref 80.0–100.0)
MONO ABS: 261 {cells}/uL (ref 200–950)
MPV: 10 fL (ref 7.5–12.5)
Monocytes Relative: 9 %
NEUTROS PCT: 36 %
Neutro Abs: 1044 cells/uL — ABNORMAL LOW (ref 1500–7800)
Platelets: 231 10*3/uL (ref 140–400)
RBC: 4.75 MIL/uL (ref 3.80–5.10)
RDW: 13.6 % (ref 11.0–15.0)
WBC: 2.9 10*3/uL — AB (ref 3.8–10.8)

## 2016-08-14 LAB — COMPLETE METABOLIC PANEL WITH GFR
ALBUMIN: 4.3 g/dL (ref 3.6–5.1)
ALK PHOS: 63 U/L (ref 33–130)
ALT: 12 U/L (ref 6–29)
AST: 15 U/L (ref 10–35)
BUN: 15 mg/dL (ref 7–25)
CALCIUM: 9.2 mg/dL (ref 8.6–10.4)
CHLORIDE: 106 mmol/L (ref 98–110)
CO2: 27 mmol/L (ref 20–31)
Creat: 0.79 mg/dL (ref 0.50–1.05)
GFR, EST NON AFRICAN AMERICAN: 86 mL/min (ref >=60)
Glucose, Bld: 76 mg/dL (ref 65–99)
POTASSIUM: 4.3 mmol/L (ref 3.5–5.3)
Sodium: 144 mmol/L (ref 135–146)
Total Bilirubin: 0.3 mg/dL (ref 0.2–1.2)
Total Protein: 7.1 g/dL (ref 6.1–8.1)

## 2016-08-14 LAB — POCT GLYCOSYLATED HEMOGLOBIN (HGB A1C): HEMOGLOBIN A1C: 5.5

## 2016-08-14 LAB — TSH: TSH: 0.59 mIU/L

## 2016-08-14 MED ORDER — ALBUTEROL SULFATE HFA 108 (90 BASE) MCG/ACT IN AERS: 2.0000 | INHALATION_SPRAY | RESPIRATORY_TRACT | 3 refills | Status: DC | PRN

## 2016-08-14 MED ORDER — OMEPRAZOLE 20 MG PO CPDR: 20.0000 mg | DELAYED_RELEASE_CAPSULE | Freq: Every day | ORAL | 3 refills | Status: DC

## 2016-08-14 MED ORDER — LOSARTAN POTASSIUM-HCTZ 50-12.5 MG PO TABS
1.0000 | ORAL_TABLET | Freq: Every day | ORAL | 3 refills | Status: DC
Start: 2016-08-14 — End: 2016-08-14

## 2016-08-14 MED ORDER — LOSARTAN POTASSIUM-HCTZ 50-12.5 MG PO TABS: 1.0000 | ORAL_TABLET | Freq: Every day | ORAL | 3 refills | Status: DC

## 2016-08-14 MED FILL — OMEPRAZOLE DR 20 MG CAPSULE: 20 | 30 days supply | Qty: 30 | Fill #0

## 2016-08-14 MED FILL — !VENTOLIN HFA INHALER: 108 (90 BAS | 25 days supply | Qty: 18 | Fill #0

## 2016-08-14 MED FILL — LOSARTAN-HCTZ 50-12.5 MG TA: 50-12.5 | 30 days supply | Qty: 30 | Fill #0

## 2016-08-14 NOTE — Patient Instructions (Signed)
DASH Eating Plan DASH stands for "Dietary Approaches to Stop Hypertension." The DASH eating plan is a healthy eating plan that has been shown to reduce high blood pressure (hypertension). Additional health benefits may include reducing the risk of type 2 diabetes mellitus, heart disease, and stroke. The DASH eating plan may also help with weight loss. What do I need to know about the DASH eating plan? For the DASH eating plan, you will follow these general guidelines:  Choose foods with less than 150 milligrams of sodium per serving (as listed on the food label).  Use salt-free seasonings or herbs instead of table salt or sea salt.  Check with your health care provider or pharmacist before using salt substitutes.  Eat lower-sodium products. These are often labeled as "low-sodium" or "no salt added."  Eat fresh foods. Avoid eating a lot of canned foods.  Eat more vegetables, fruits, and low-fat dairy products.  Choose whole grains. Look for the word "whole" as the first word in the ingredient list.  Choose fish and skinless chicken or turkey more often than red meat. Limit fish, poultry, and meat to 6 oz (170 g) each day.  Limit sweets, desserts, sugars, and sugary drinks.  Choose heart-healthy fats.  Eat more home-cooked food and less restaurant, buffet, and fast food.  Limit fried foods.  Do not fry foods. Cook foods using methods such as baking, boiling, grilling, and broiling instead.  When eating at a restaurant, ask that your food be prepared with less salt, or no salt if possible. What foods can I eat? Seek help from a dietitian for individual calorie needs. Grains  Whole grain or whole wheat bread. Brown rice. Whole grain or whole wheat pasta. Quinoa, bulgur, and whole grain cereals. Low-sodium cereals. Corn or whole wheat flour tortillas. Whole grain cornbread. Whole grain crackers. Low-sodium crackers. Vegetables  Fresh or frozen vegetables (raw, steamed, roasted, or  grilled). Low-sodium or reduced-sodium tomato and vegetable juices. Low-sodium or reduced-sodium tomato sauce and paste. Low-sodium or reduced-sodium canned vegetables. Fruits  All fresh, canned (in natural juice), or frozen fruits. Meat and Other Protein Products  Ground beef (85% or leaner), grass-fed beef, or beef trimmed of fat. Skinless chicken or turkey. Ground chicken or turkey. Pork trimmed of fat. All fish and seafood. Eggs. Dried beans, peas, or lentils. Unsalted nuts and seeds. Unsalted canned beans. Dairy  Low-fat dairy products, such as skim or 1% milk, 2% or reduced-fat cheeses, low-fat ricotta or cottage cheese, or plain low-fat yogurt. Low-sodium or reduced-sodium cheeses. Fats and Oils  Tub margarines without trans fats. Light or reduced-fat mayonnaise and salad dressings (reduced sodium). Avocado. Safflower, olive, or canola oils. Natural peanut or almond butter. Other  Unsalted popcorn and pretzels. The items listed above may not be a complete list of recommended foods or beverages. Contact your dietitian for more options.  What foods are not recommended? Grains  White bread. White pasta. White rice. Refined cornbread. Bagels and croissants. Crackers that contain trans fat. Vegetables  Creamed or fried vegetables. Vegetables in a cheese sauce. Regular canned vegetables. Regular canned tomato sauce and paste. Regular tomato and vegetable juices. Fruits  Canned fruit in light or heavy syrup. Fruit juice. Meat and Other Protein Products  Fatty cuts of meat. Ribs, chicken wings, bacon, sausage, bologna, salami, chitterlings, fatback, hot dogs, bratwurst, and packaged luncheon meats. Salted nuts and seeds. Canned beans with salt. Dairy  Whole or 2% milk, cream, half-and-half, and cream cheese. Whole-fat or sweetened yogurt. Full-fat cheeses   or blue cheese. Nondairy creamers and whipped toppings. Processed cheese, cheese spreads, or cheese curds. Condiments  Onion and garlic  salt, seasoned salt, table salt, and sea salt. Canned and packaged gravies. Worcestershire sauce. Tartar sauce. Barbecue sauce. Teriyaki sauce. Soy sauce, including reduced sodium. Steak sauce. Fish sauce. Oyster sauce. Cocktail sauce. Horseradish. Ketchup and mustard. Meat flavorings and tenderizers. Bouillon cubes. Hot sauce. Tabasco sauce. Marinades. Taco seasonings. Relishes. Fats and Oils  Butter, stick margarine, lard, shortening, ghee, and bacon fat. Coconut, palm kernel, or palm oils. Regular salad dressings. Other  Pickles and olives. Salted popcorn and pretzels. The items listed above may not be a complete list of foods and beverages to avoid. Contact your dietitian for more information.  Where can I find more information? National Heart, Lung, and Blood Institute: travelstabloid.com This information is not intended to replace advice given to you by your health care provider. Make sure you discuss any questions you have with your health care provider. Document Released: 07/31/2011 Document Revised: 01/17/2016 Document Reviewed: 06/15/2013 Elsevier Interactive Patient Education  2017 Elsevier Inc. Hypertension Hypertension, commonly called high blood pressure, is when the force of blood pumping through your arteries is too strong. Your arteries are the blood vessels that carry blood from your heart throughout your body. A blood pressure reading consists of a higher number over a lower number, such as 110/72. The higher number (systolic) is the pressure inside your arteries when your heart pumps. The lower number (diastolic) is the pressure inside your arteries when your heart relaxes. Ideally you want your blood pressure below 120/80. Hypertension forces your heart to work harder to pump blood. Your arteries may become narrow or stiff. Having untreated or uncontrolled hypertension can cause heart attack, stroke, kidney disease, and other problems. What  increases the risk? Some risk factors for high blood pressure are controllable. Others are not. Risk factors you cannot control include:  Race. You may be at higher risk if you are African American.  Age. Risk increases with age.  Gender. Men are at higher risk than women before age 80 years. After age 60, women are at higher risk than men. Risk factors you can control include:  Not getting enough exercise or physical activity.  Being overweight.  Getting too much fat, sugar, calories, or salt in your diet.  Drinking too much alcohol. What are the signs or symptoms? Hypertension does not usually cause signs or symptoms. Extremely high blood pressure (hypertensive crisis) may cause headache, anxiety, shortness of breath, and nosebleed. How is this diagnosed? To check if you have hypertension, your health care provider will measure your blood pressure while you are seated, with your arm held at the level of your heart. It should be measured at least twice using the same arm. Certain conditions can cause a difference in blood pressure between your right and left arms. A blood pressure reading that is higher than normal on one occasion does not mean that you need treatment. If it is not clear whether you have high blood pressure, you may be asked to return on a different day to have your blood pressure checked again. Or, you may be asked to monitor your blood pressure at home for 1 or more weeks. How is this treated? Treating high blood pressure includes making lifestyle changes and possibly taking medicine. Living a healthy lifestyle can help lower high blood pressure. You may need to change some of your habits. Lifestyle changes may include:  Following the DASH diet. This  diet is high in fruits, vegetables, and whole grains. It is low in salt, red meat, and added sugars.  Keep your sodium intake below 2,300 mg per day.  Getting at least 30-45 minutes of aerobic exercise at least 4 times  per week.  Losing weight if necessary.  Not smoking.  Limiting alcoholic beverages.  Learning ways to reduce stress. Your health care provider may prescribe medicine if lifestyle changes are not enough to get your blood pressure under control, and if one of the following is true:  You are 24-39 years of age and your systolic blood pressure is above 140.  You are 66 years of age or older, and your systolic blood pressure is above 150.  Your diastolic blood pressure is above 90.  You have diabetes, and your systolic blood pressure is over 226 or your diastolic blood pressure is over 90.  You have kidney disease and your blood pressure is above 140/90.  You have heart disease and your blood pressure is above 140/90. Your personal target blood pressure may vary depending on your medical conditions, your age, and other factors. Follow these instructions at home:  Have your blood pressure rechecked as directed by your health care provider.  Take medicines only as directed by your health care provider. Follow the directions carefully. Blood pressure medicines must be taken as prescribed. The medicine does not work as well when you skip doses. Skipping doses also puts you at risk for problems.  Do not smoke.  Monitor your blood pressure at home as directed by your health care provider. Contact a health care provider if:  You think you are having a reaction to medicines taken.  You have recurrent headaches or feel dizzy.  You have swelling in your ankles.  You have trouble with your vision. Get help right away if:  You develop a severe headache or confusion.  You have unusual weakness, numbness, or feel faint.  You have severe chest or abdominal pain.  You vomit repeatedly.  You have trouble breathing. This information is not intended to replace advice given to you by your health care provider. Make sure you discuss any questions you have with your health care  provider. Document Released: 08/11/2005 Document Revised: 01/17/2016 Document Reviewed: 06/03/2013 Elsevier Interactive Patient Education  2017 [employer].

## 2016-08-14 NOTE — BH Specialist Note (Signed)
Session Start time: 12:30 pm   End Time: 12:55 pm Total Time:  25 minutes Type of Service: [place]: No.   Interpreter Name & Language: N/A # [facility] Visits July 2017-June 2018: 1st   SUBJECTIVE: Amber Shaw is a 55 y.o. female  Pt. was referred by Dr. Doreene Burke for:  anxiety and depression. Pt. reports the following symptoms/concerns: difficulty sleeping, hot flashes, racing thoughts, and worrying about finances Duration of problem:  2 months Severity: moderate Previous treatment: None reported   OBJECTIVE: Mood: Anxious and Pleasant & Affect: Appropriate Risk of harm to self or others: Pt denied SI/HI Assessments administered: PHQ-9; GAD-7  LIFE CONTEXT:  Family & Social: Pt has an adult daughter and 73 year old granddaughter who reside nearby. Her adult son and extended family reside in [place] School/ Work: Pt was recently laid off by [employer] in Oct 2017. She currently receives unemployment, food stamps ($192), and has applied for section 8 housing Self-Care: Pt has difficulty sleeping, no concerns regarding appetite. No report of substance use  Life changes: Pt recently ended a seven year long relationship and was laid off by job  What is important to pt/family (values): Family and Spirituality   GOALS ADDRESSED:  Decrease symptoms of depression Decrease symptoms of anxiety  INTERVENTIONS: Solution Focused, Strength-based and Supportive   ASSESSMENT:  Pt currently experiencing depression and anxiety triggered by financial strain and recently separating from a seven year long relationship. Pt reports difficulty sleeping, hot flashes, racing thoughts, and worrying about finances. Pt may benefit from psychoeducation and psychotherapy. LCSWA educated pt on the cycle of depression and anxiety and discussed healthy coping skills to decrease symptoms of anxiety. Pt identified strategies to implement on a weekly basis to combat symptoms.  Pt was provided community resources for crisis intervention and psychotherapy.      PLAN: 1. F/U with behavioral health clinician: Pt was encouraged to contact [place] if symptoms worsen or fail to improve to schedule behavioral appointments at [facility]. 2. Behavioral Health meds: None reported 3. Behavioral recommendations: LCSWA recommends that pt apply healthy coping skills discussed and utilize community resources to initiate behavioral health services. Pt is encouraged to schedule follow up appointment with LCSWA 4. Referral: Brief Counseling/Psychotherapy, [employer], Problem-solving teaching/coping strategies, Psychoeducation and Supportive Counseling 5. From scale of 1-10, how likely are you to follow plan: [place], MSW, [place] Worker 08/14/16 4:29 pm  Warmhandoff:   Warm Hand Off Completed.

## 2016-08-14 NOTE — Progress Notes (Signed)
Amber Shaw, is a 55 y.o. female  [id number]  [id number]  DOB - 1961/02/15  Chief Complaint  Patient presents with  . Annual Exam     Stomach pain       Subjective:   Amber Shaw is a 55 y.o. female with medical history of hypertension, asthma, COPD and osteoarthritis here today for annual physical examination. Patient recently lost her job with [employer]. Patient currently experiencing depression and anxiety triggered by financial strain and also recently separating from a seven year long relationship. She reports difficulty sleeping, hot flashes, racing thoughts, and worrying about finances. She denies any suicidal ideation or thought. She will like to speak to our LCSW today for counseling. Patient has been lost to follow up since 05/03/2015. Patient has No headache, No chest pain, No abdominal pain - No Nausea, No new weakness tingling or numbness, No Cough - SOB. Patient is due for mammogram, pap smear and colonoscopy. She needs refill on her medications.   Problem  Pap Smear for Cervical Cancer Screening  Colon Cancer Screening  Gastroesophageal Reflux Disease    ALLERGIES: Allergies  Allergen Reactions  . Percocet [Oxycodone-Acetaminophen] Other (See Comments)    Painful urination     PAST MEDICAL HISTORY: Past Medical History:  Diagnosis Date  . Arthritis   . Asthma   . COPD (chronic obstructive pulmonary disease) ([place])   . Hypertension     MEDICATIONS AT HOME: Prior to Admission medications   Medication Sig Start Date End Date Taking? Authorizing Provider  albuterol (PROVENTIL HFA;VENTOLIN HFA) 108 (90 Base) MCG/ACT inhaler Inhale 2 puffs into the lungs every 4 (four) hours as needed for wheezing or shortness of breath. 08/14/16  Yes Tresa Garter, MD  budesonide-formoterol (SYMBICORT) 160-4.5 MCG/ACT inhaler Take 2 puffs first thing in am and then another 2 puffs about 12 hours later. 04/20/15  Yes Bjorn Pippin, PA-C  ibuprofen  (ADVIL,MOTRIN) 200 MG tablet Take 200 mg by mouth every 6 (six) hours as needed.   Yes Historical Provider, MD  losartan-hydrochlorothiazide (HYZAAR) 50-12.5 MG tablet Take 1 tablet by mouth daily. 08/14/16  Yes Tresa Garter, MD  omeprazole (PRILOSEC) 20 MG capsule Take 1 capsule (20 mg total) by mouth daily. 08/14/16  Yes Tresa Garter, MD  cyclobenzaprine (FLEXERIL) 10 MG tablet Take 1 tablet (10 mg total) by mouth 3 (three) times daily as needed for muscle spasms. May take 0.5 tablet if it makes you too drowsy Patient not taking: Reported on 08/14/2016 05/03/15   Lance Bosch, NP    Objective:   Vitals:   08/14/16 1122  BP: 126/80  Pulse: 68  Resp: 18  Temp: 97.7 F (36.5 C)  TempSrc: Oral  SpO2: 100%  Weight: 175 lb 12.8 oz (79.7 kg)  Height: 5\' 5"  (1.651 m)   Exam General appearance : Awake, alert, not in any distress. Speech Clear. Not toxic looking HEENT: Atraumatic and Normocephalic, pupils equally reactive to light and accomodation Neck: Supple, no JVD. No cervical lymphadenopathy.  Chest: Good air entry bilaterally, no added sounds  CVS: S1 S2 regular, no murmurs.  Abdomen: Bowel sounds present, Non tender and not distended with no gaurding, rigidity or rebound. Extremities: B/L Lower Ext shows no edema, both legs are warm to touch Neurology: Awake alert, and oriented X 3, CN II-XII intact, Non focal Skin: No Rash  Pelvic Exam: Cervix normal in appearance, external genitalia normal, no adnexal masses or tenderness, no cervical motion tenderness, rectovaginal septum normal,  uterus normal size, shape, and consistency and vagina normal without discharge    Data Review Lab Results  Component Value Date   HGBA1C 5.4 03/20/2014    Assessment & Plan   1. Essential hypertension, benign  - losartan-hydrochlorothiazide (HYZAAR) 50-12.5 MG tablet; Take 1 tablet by mouth daily.  Dispense: 90 tablet; Refill: 3 - CBC with Differential/Platelet - COMPLETE  METABOLIC PANEL WITH GFR - POCT glycosylated hemoglobin (Hb A1C) - Lipid panel - TSH - Urinalysis, Complete - VITAMIN D 25 Hydroxy (Vit-D Deficiency, Fractures)  2. COPD GOLD III  - albuterol (PROVENTIL HFA;VENTOLIN HFA) 108 (90 Base) MCG/ACT inhaler; Inhale 2 puffs into the lungs every 4 (four) hours as needed for wheezing or shortness of breath.  Dispense: 3 Inhaler; Refill: 3  3. Gastroesophageal reflux disease, esophagitis presence not specified  - omeprazole (PRILOSEC) 20 MG capsule; Take 1 capsule (20 mg total) by mouth daily.  Dispense: 30 capsule; Refill: 3  4. Colon cancer screening  - Ambulatory referral to Gastroenterology  5. Screening for breast cancer  - MM Digital Screening; Future  6. Pap smear for cervical cancer screening  - Cytology - PAP - Cervicovaginal ancillary only - HIV antibody (with reflex)  LCSWA educated pt on the cycle of depression and anxiety and discussed healthy coping skills to decrease symptoms of anxiety.Pt identified strategies to implement on a weekly basis to combat symptoms. Pt was provided community resources for crisis interventionand psychotherapy.   Patient have been counseled extensively about nutrition and exercise. Other issues discussed during this visit include: low cholesterol diet, weight control and daily exercise, importance of adherence with medications and regular follow-up. We also discussed long term complications of uncontrolled hypertension.   Return in about 3 months (around 11/12/2016) for Follow up HTN, Routine Follow Up.  The patient was given clear instructions to go to ER or return to medical center if symptoms don't improve, worsen or new problems develop. The patient verbalized understanding. The patient was told to call to get lab results if they haven't heard anything in the next week.   This note has been created with Surveyor, quantity. Any transcriptional errors  are unintentional.    Angelica Chessman, MD, [place], [place], [place], [place] and [place], [place]   08/14/2016, 12:08 PM

## 2016-08-14 NOTE — Progress Notes (Signed)
Patient is here for today for her physical exam.  Patient also stated that she been having some stomach pain that comes and goes.  Also patient stated that whenever she takes her Prilosec it help her out with the pain.  Patient tolerated the flu shot today.

## 2016-08-15 LAB — URINALYSIS, COMPLETE
BACTERIA UA: NONE SEEN [HPF]
BILIRUBIN URINE: NEGATIVE
CASTS: NONE SEEN [LPF]
CRYSTALS: NONE SEEN [HPF]
Glucose, UA: NEGATIVE
HGB URINE DIPSTICK: NEGATIVE
KETONES UR: NEGATIVE
Leukocytes, UA: NEGATIVE
Nitrite: NEGATIVE
Protein, ur: NEGATIVE
RBC / HPF: NONE SEEN RBC/HPF (ref <=2)
SPECIFIC GRAVITY, URINE: 1.022 (ref 1.001–1.035)
SQUAMOUS EPITHELIAL / LPF: NONE SEEN [HPF] (ref <=5)
WBC UA: NONE SEEN WBC/HPF (ref <=5)
Yeast: NONE SEEN [HPF]
pH: 7 (ref 5.0–8.0)

## 2016-08-15 LAB — VITAMIN D 25 HYDROXY (VIT D DEFICIENCY, FRACTURES): VIT D 25 HYDROXY: 12 ng/mL — AB (ref 30–100)

## 2016-08-15 LAB — CYTOLOGY - PAP
CHLAMYDIA, DNA PROBE: NEGATIVE
Diagnosis: NEGATIVE
Neisseria Gonorrhea: NEGATIVE
Trichomonas: NEGATIVE

## 2016-08-15 LAB — HIV ANTIBODY (ROUTINE TESTING W REFLEX): HIV: NONREACTIVE

## 2016-08-16 LAB — CERVICOVAGINAL ANCILLARY ONLY
Bacterial vaginitis: POSITIVE — AB
Candida vaginitis: NEGATIVE

## 2016-08-19 LAB — CERVICOVAGINAL ANCILLARY ONLY: HERPES (WINDOWPATH): NEGATIVE

## 2016-08-20 ENCOUNTER — Telehealth: Payer: Self-pay | Admitting: Internal Medicine

## 2016-08-20 NOTE — Telephone Encounter (Signed)
Results have not been reviewed by PCP just yet.

## 2016-08-20 NOTE — Telephone Encounter (Signed)
Patient is needing lab results. Patient also has question about medications. Patient needs to know why she's taking specific medications. Please follow up.

## 2016-08-22 ENCOUNTER — Other Ambulatory Visit: Payer: Self-pay | Admitting: Internal Medicine

## 2016-08-22 MED ORDER — VITAMIN D (ERGOCALCIFEROL) 1.25 MG (50000 UNIT) PO CAPS: 50000.0000 [IU] | ORAL_CAPSULE | ORAL | 0 refills | Status: DC

## 2016-08-22 MED ORDER — METRONIDAZOLE 500 MG PO TABS: 500.0000 mg | ORAL_TABLET | Freq: Three times a day (TID) | ORAL | 0 refills | Status: DC

## 2016-08-22 MED ORDER — METRONIDAZOLE 500 MG PO TABS: 500.0000 mg | ORAL_TABLET | Freq: Two times a day (BID) | ORAL | 0 refills | Status: DC

## 2016-08-22 MED FILL — VIT D2 1.25 MG (50,000 UNIT: 1.25 MG | 84 days supply | Qty: 12 | Fill #0

## 2016-08-22 MED FILL — ?METRONIDAZOLE 500 MG TABLE: 500 | 7 days supply | Qty: 21 | Fill #0

## 2016-08-22 NOTE — Telephone Encounter (Signed)
-----   Message from Tresa Garter, MD sent at 08/22/2016 10:41 AM EST ----- Please inform patient that her lab results are mostly normal except for low vitamin D and high cholesterol. Vitamin D has been prescribed to the pharmacy for pick up. To address cholesterol, please limit saturated fat to no more than 7% of your calories, limit cholesterol to 200 mg/day, increase fiber and exercise as tolerated. If needed we may add a cholesterol lowering medication to your regimen. Pap smear is negative for malignancy, negative herpes, positive for bacterial vaginosis, not a sexually transmitted disease, treatable with antibiotics: prescribed to the pharmacy for pickup. HIV Negative as of 08/14/2016

## 2016-08-22 NOTE — Progress Notes (Signed)
vitami

## 2016-08-22 NOTE — Telephone Encounter (Signed)
Patient verified DOB Patient is aware of lab results being mostly normal except for vitamin d level being low and a supplement being prescribed to the pharmacy. Patient is also aware of implementing some diet changes to aid in lowering her cholesterol level.  Patient is also aware of HIV and STD screening being negative but patient is positive for BV. Patient is also aware of medication being sent to the pharmacy.

## 2016-09-17 ENCOUNTER — Other Ambulatory Visit: Payer: Self-pay | Admitting: Internal Medicine

## 2016-09-17 DIAGNOSIS — Z1231 Encounter for screening mammogram for malignant neoplasm of breast: Secondary | ICD-10-CM

## 2016-10-01 ENCOUNTER — Ambulatory Visit
Admission: RE | Admit: 2016-10-01 | Discharge: 2016-10-01 | Disposition: A | Discharge status: Home / Self Care | Payer: No Typology Code available for payment source | Source: Ambulatory Visit | Attending: Internal Medicine | Admitting: Internal Medicine

## 2016-10-01 DIAGNOSIS — Z1231 Encounter for screening mammogram for malignant neoplasm of breast: Secondary | ICD-10-CM

## 2016-10-02 ENCOUNTER — Telehealth: Payer: Self-pay | Admitting: *Deleted

## 2016-10-02 NOTE — Telephone Encounter (Signed)
-----   Message from Tresa Garter, MD sent at 10/01/2016  2:52 PM EST ----- Please inform patient that her screening mammogram shows no evidence of malignancy. Recommend screening mammogram in one year

## 2016-10-02 NOTE — Telephone Encounter (Signed)
Patient verified DOB Patient is aware of mammogram screening being negative for malignancy and a recommended screening being completed in one year. Patient expressed her gratification and had no further questions at this time.

## 2016-10-27 MED FILL — ?OMEPRAZOLE DR 20 MG CAPSUL: 20 | 30 days supply | Qty: 30 | Fill #1

## 2016-10-27 MED FILL — LOSARTAN-HCTZ 50-12.5 MG TA: 50-12.5 | 30 days supply | Qty: 30 | Fill #1

## 2016-10-27 MED FILL — VENTOLIN HFA 90 MCG INHALER: 108 (90 BAS | 25 days supply | Qty: 18 | Fill #1

## 2016-11-10 ENCOUNTER — Other Ambulatory Visit: Payer: Self-pay | Admitting: *Deleted

## 2016-11-10 DIAGNOSIS — J449 Chronic obstructive pulmonary disease, unspecified: Secondary | ICD-10-CM

## 2016-11-10 MED ORDER — ALBUTEROL SULFATE HFA 108 (90 BASE) MCG/ACT IN AERS: 2.0000 | INHALATION_SPRAY | RESPIRATORY_TRACT | 3 refills | Status: DC | PRN

## 2016-11-10 MED ORDER — FLUTICASONE FUROATE-VILANTEROL 200-25 MCG/INH IN AEPB: 1.0000 | INHALATION_SPRAY | Freq: Every day | RESPIRATORY_TRACT | 3 refills | Status: DC

## 2016-11-10 NOTE — Telephone Encounter (Signed)
PRINTED FOR PASS PROGRAM 

## 2016-12-09 MED FILL — $BREO ELLIPTA 200-25 MCG IN: 200-25 | 30 days supply | Qty: 60 | Fill #0

## 2016-12-09 MED FILL — VIT D2 1.25 MG (50,000 UNIT: 1.25 MG | 84 days supply | Qty: 12 | Fill #1

## 2016-12-09 MED FILL — $VENTOLIN HFA 18G INHALER: 108 (90 BAS | 25 days supply | Qty: 18 | Fill #0

## 2016-12-09 MED FILL — ?OMEPRAZOLE DR 20 MG CAPSUL: 20 | 30 days supply | Qty: 30 | Fill #2

## 2016-12-09 MED FILL — LOSARTAN-HCTZ 50-12.5 MG TA: 50-12.5 | 30 days supply | Qty: 30 | Fill #2

## 2017-01-15 ENCOUNTER — Other Ambulatory Visit: Payer: Self-pay | Admitting: Internal Medicine

## 2017-01-15 DIAGNOSIS — K219 Gastro-esophageal reflux disease without esophagitis: Secondary | ICD-10-CM

## 2017-01-27 MED FILL — ?OMEPRAZOLE DR 20 MG CAPSUL: 20 | 30 days supply | Qty: 30 | Fill #0

## 2017-01-29 MED FILL — LOSARTAN-HCTZ 50-12.5 MG TA: 50-12.5 | 30 days supply | Qty: 30 | Fill #3

## 2017-02-13 MED FILL — !VENTOLIN HFA INHALER: 108 (90 BAS | 25 days supply | Qty: 18 | Fill #1

## 2017-02-16 MED FILL — $BREO ELLIPTA 200-25 MCG IN: 200-25 | 30 days supply | Qty: 60 | Fill #1

## 2017-03-02 MED FILL — ?LOSARTAN-HCTZ 50-12.5 MG T: 50-12.5 | 30 days supply | Qty: 30 | Fill #4

## 2017-03-02 MED FILL — ?OMEPRAZOLE DR 20 MG CAPSUL: 20 | 30 days supply | Qty: 30 | Fill #1

## 2017-03-05 ENCOUNTER — Telehealth: Payer: Self-pay | Admitting: Internal Medicine

## 2017-03-05 NOTE — Telephone Encounter (Signed)
Pt calling to request a letter stating her medical condition and would like the letter to state that she is not able to live in the vicinity of mold due to her COPD. States that she was advised by apt complex that she has mold growing in her apt. Pt would like a call to advise of letter being ready to pick up. Please f/u. Thank you.

## 2017-03-11 ENCOUNTER — Ambulatory Visit: Payer: No Typology Code available for payment source | Admitting: Internal Medicine

## 2017-03-13 NOTE — Telephone Encounter (Signed)
Patient needs an office visit. Has not been seen for over 6 months

## 2017-03-18 ENCOUNTER — Ambulatory Visit: Payer: No Typology Code available for payment source | Admitting: Internal Medicine

## 2017-04-06 ENCOUNTER — Other Ambulatory Visit: Payer: Self-pay | Admitting: Internal Medicine

## 2017-04-06 DIAGNOSIS — K219 Gastro-esophageal reflux disease without esophagitis: Secondary | ICD-10-CM

## 2017-04-06 MED FILL — ?OMEPRAZOLE DR 20 MG CAPSUL: 20 | 30 days supply | Qty: 30 | Fill #2

## 2017-04-06 MED FILL — $VENTOLIN HFA 18G INHALER: 108 (90 BAS | 25 days supply | Qty: 18 | Fill #2

## 2017-04-06 MED FILL — LOSARTAN-HCTZ 50-12.5 MG TA: 50-12.5 | 30 days supply | Qty: 30 | Fill #5

## 2017-04-06 MED FILL — $BREO ELLIPTA 200-25 MCG IN: 200-25 | 30 days supply | Qty: 60 | Fill #2

## 2017-05-13 MED FILL — LOSARTAN-HCTZ 50-12.5 MG TA: 50-12.5 | 30 days supply | Qty: 30 | Fill #6

## 2017-05-13 MED FILL — $VENTOLIN HFA 18G INHALER: 108 (90 BAS | 33 days supply | Qty: 36 | Fill #3

## 2017-05-13 MED FILL — ?OMEPRAZOLE DR 20 MG CAPSUL: 20 | 30 days supply | Qty: 30 | Fill #3

## 2017-05-13 MED FILL — $BREO ELLIPTA 200-25 MCG IN: 200-25 | 30 days supply | Qty: 60 | Fill #3

## 2017-06-15 ENCOUNTER — Other Ambulatory Visit: Payer: Self-pay | Admitting: Internal Medicine

## 2017-06-15 DIAGNOSIS — K219 Gastro-esophageal reflux disease without esophagitis: Secondary | ICD-10-CM

## 2017-06-15 MED FILL — $BREO ELLIPTA 200-25 MCG IN: 200-25 | 30 days supply | Qty: 60 | Fill #4

## 2017-06-15 MED FILL — LOSARTAN-HCTZ 50-12.5 MG TA: 50-12.5 | 30 days supply | Qty: 30 | Fill #7

## 2017-06-15 MED FILL — $VENTOLIN HFA 18G INHALER: 108 (90 BAS | 33 days supply | Qty: 36 | Fill #4

## 2017-06-15 MED FILL — ?OMEPRAZOLE DR 20 MG CAPSUL: 20 | 30 days supply | Qty: 30 | Fill #0

## 2017-07-21 ENCOUNTER — Other Ambulatory Visit: Payer: Self-pay | Admitting: Internal Medicine

## 2017-07-21 DIAGNOSIS — K219 Gastro-esophageal reflux disease without esophagitis: Secondary | ICD-10-CM

## 2017-07-21 MED FILL — $BREO ELLIPTA 200-25 MCG IN: 200-25 | 30 days supply | Qty: 60 | Fill #5

## 2017-07-21 MED FILL — LOSARTAN-HCTZ 50-12.5 MG TA: 50-12.5 | 30 days supply | Qty: 30 | Fill #8

## 2017-07-21 MED FILL — $VENTOLIN HFA 18G INHALER: 108 (90 BAS | 33 days supply | Qty: 36 | Fill #5

## 2017-09-01 ENCOUNTER — Other Ambulatory Visit: Payer: Self-pay | Admitting: Internal Medicine

## 2017-09-01 DIAGNOSIS — J449 Chronic obstructive pulmonary disease, unspecified: Secondary | ICD-10-CM

## 2017-09-01 DIAGNOSIS — I10 Essential (primary) hypertension: Secondary | ICD-10-CM

## 2017-09-01 MED ORDER — ALBUTEROL SULFATE HFA 108 (90 BASE) MCG/ACT IN AERS: 2.0000 | INHALATION_SPRAY | RESPIRATORY_TRACT | 0 refills | Status: DC | PRN

## 2017-09-01 MED FILL — $VENTOLIN HFA 18G INHALER: 108 (90 BAS | 16 days supply | Qty: 18 | Fill #6

## 2017-09-01 MED FILL — $BREO ELLIPTA 200-25 MCG IN: 200-25 | 30 days supply | Qty: 60 | Fill #6

## 2017-09-01 MED FILL — LOSARTAN-HCTZ 50-12.5 MG TA: 50-12.5 | 30 days supply | Qty: 30 | Fill #0

## 2017-09-01 NOTE — Addendum Note (Signed)
Addended by: Rica Mast on: 09/01/2017 03:23 PM   Modules accepted: Orders

## 2017-10-21 ENCOUNTER — Other Ambulatory Visit: Payer: Self-pay | Admitting: Internal Medicine

## 2017-10-21 DIAGNOSIS — I10 Essential (primary) hypertension: Secondary | ICD-10-CM

## 2017-10-26 ENCOUNTER — Other Ambulatory Visit: Payer: Self-pay | Admitting: Internal Medicine

## 2017-10-26 DIAGNOSIS — I10 Essential (primary) hypertension: Secondary | ICD-10-CM

## 2017-11-18 ENCOUNTER — Ambulatory Visit: Payer: No Typology Code available for payment source | Admitting: Internal Medicine

## 2018-02-08 IMAGING — CR DG CHEST 2V
2 series · 2 of 2 positions shown · non-contrast
Comparison: December 05, 2014

CLINICAL DATA: Cough and congestion.

EXAM:
CHEST  2 VIEW

[w chest pa]
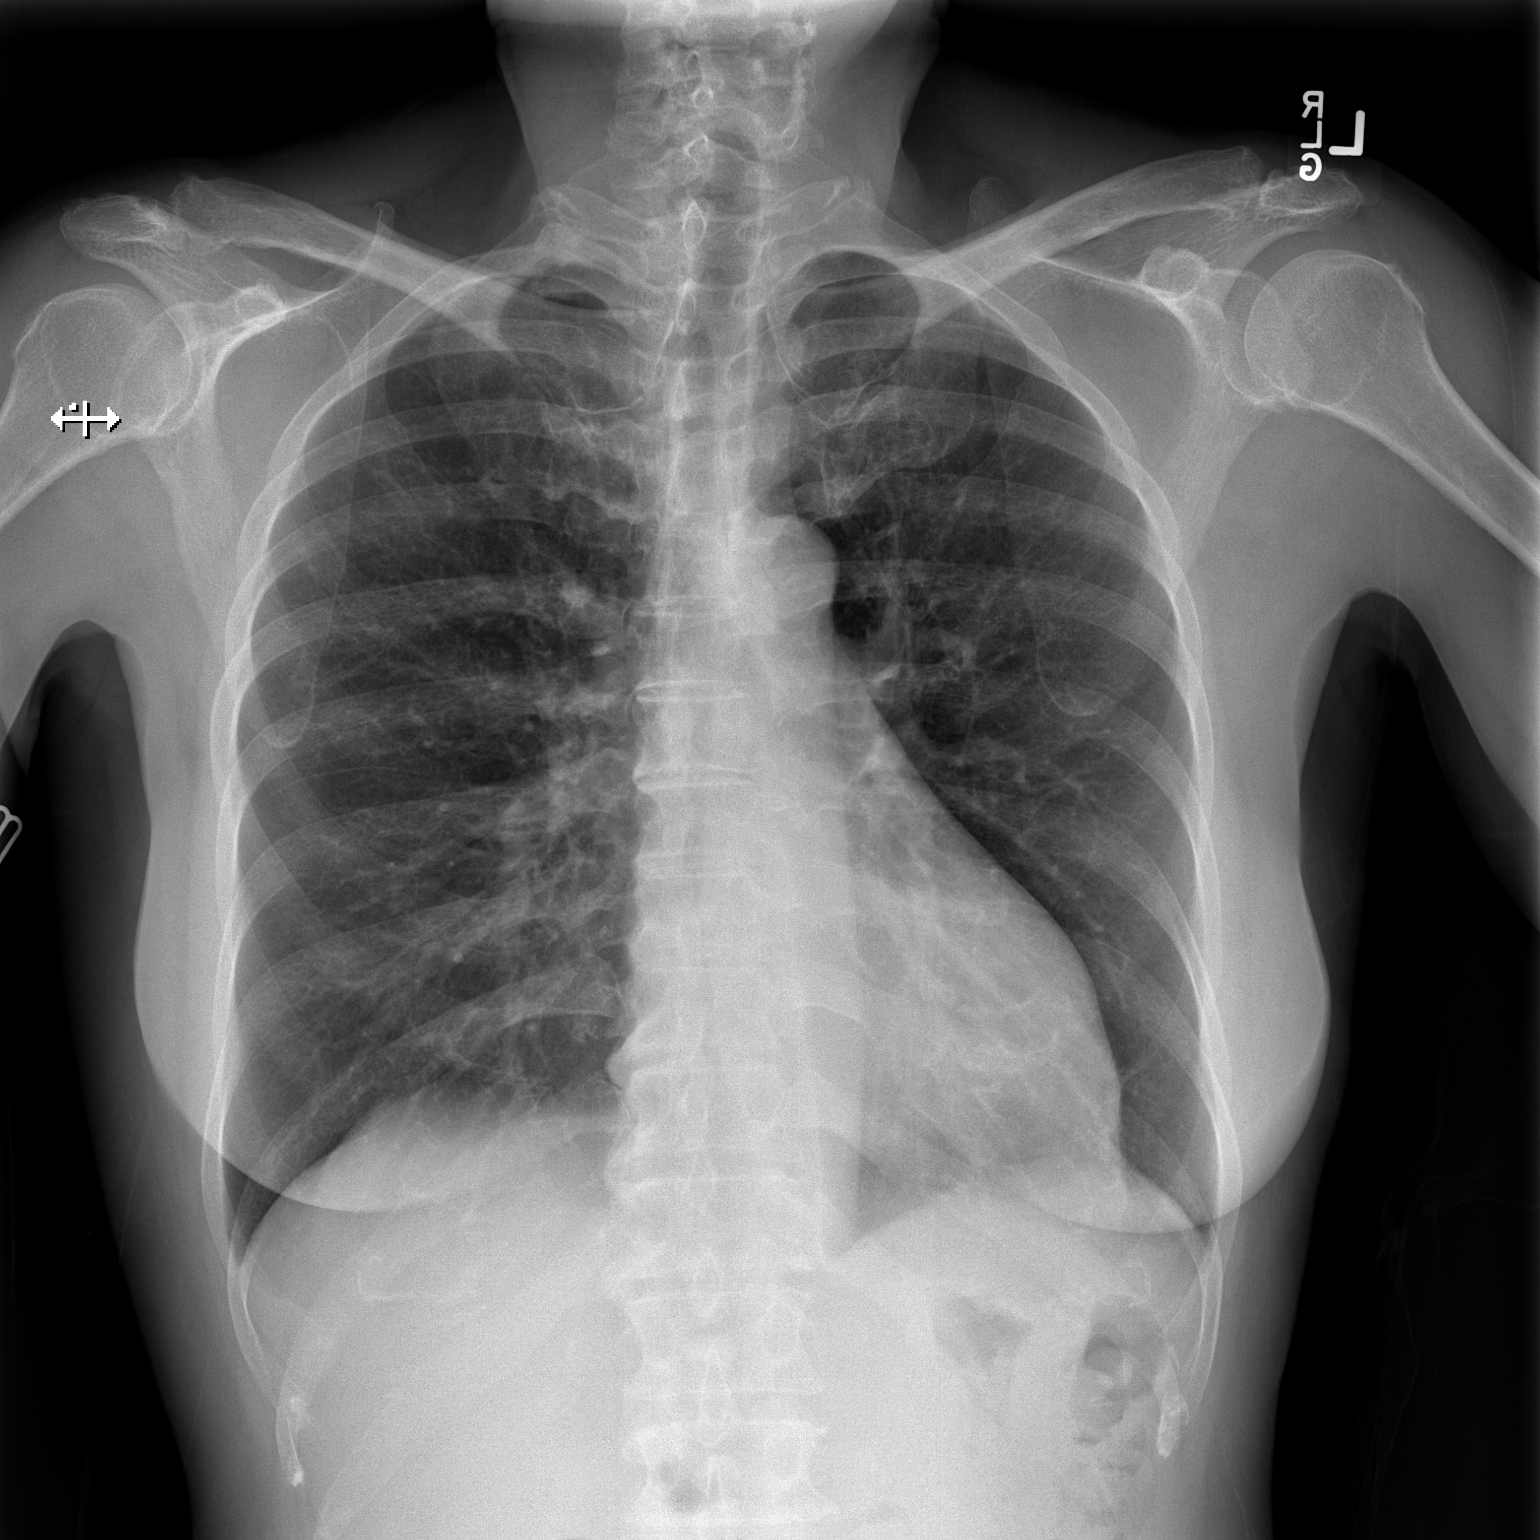

[w chest lat]
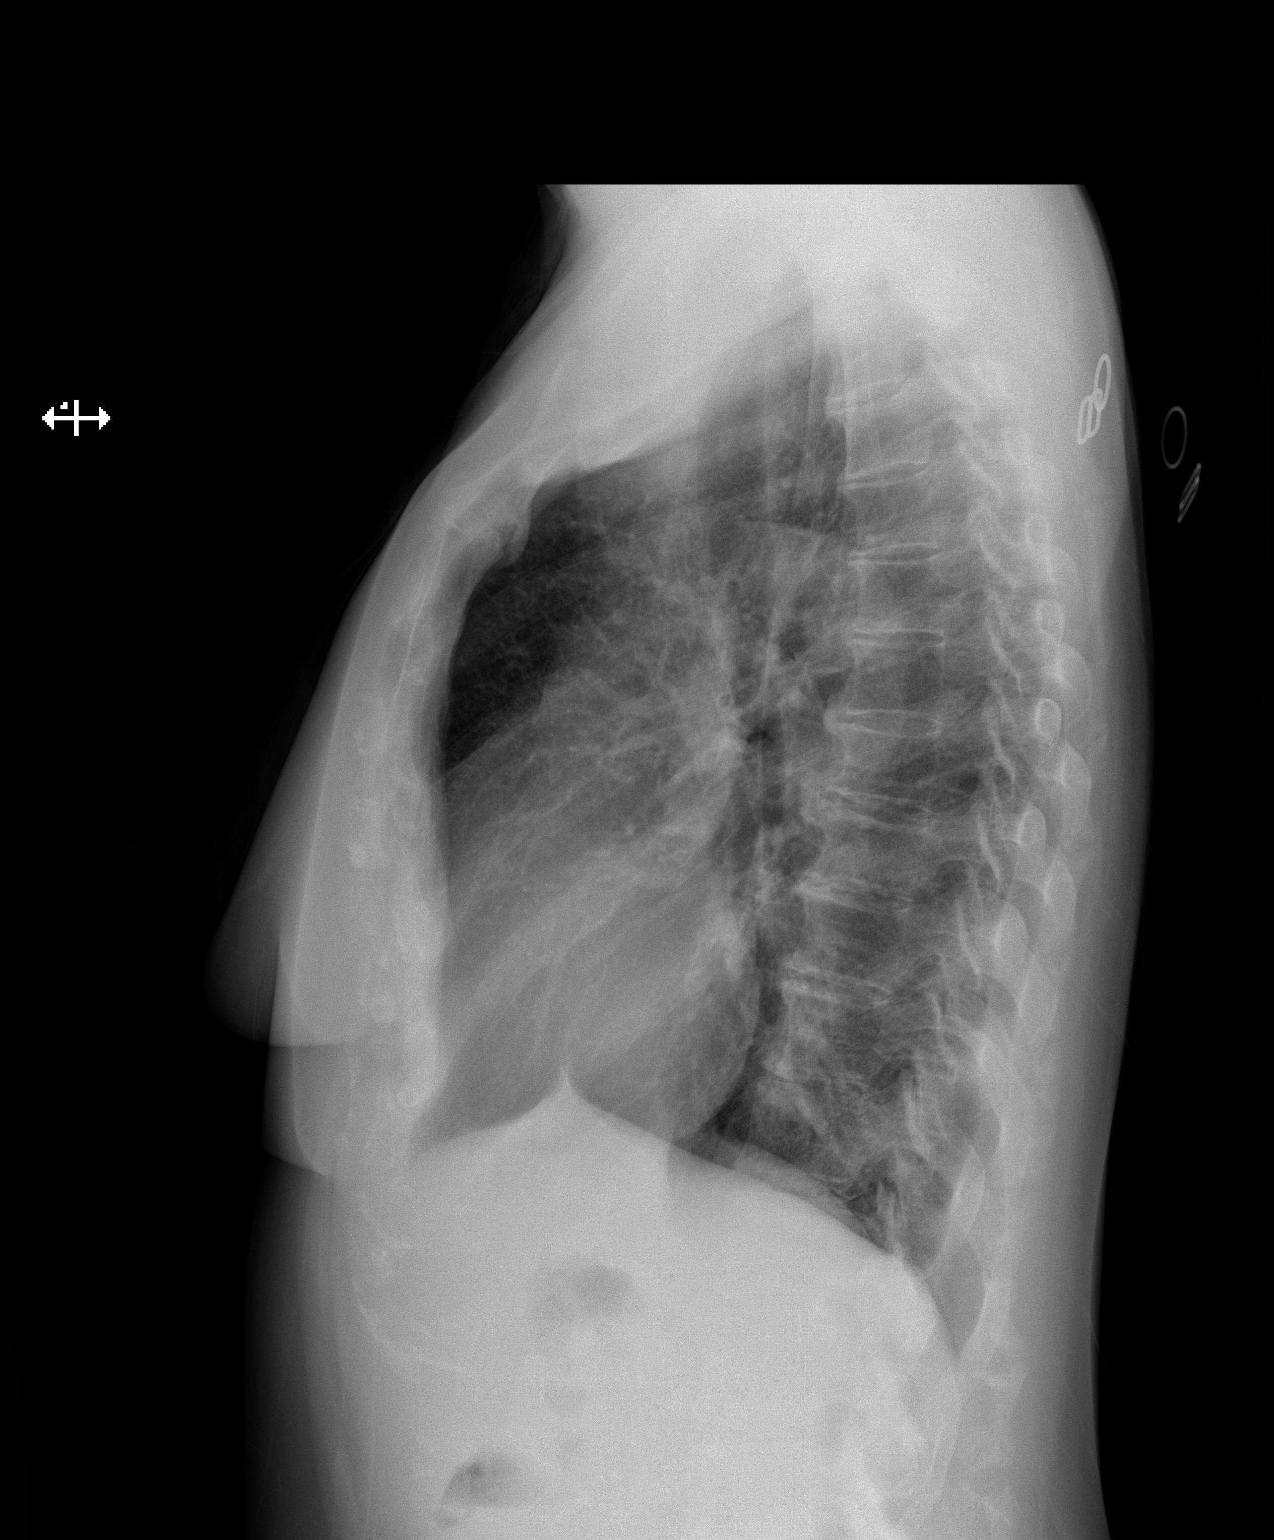

[2 of 2 positions shown; findings below may reference images not displayed]

FINDINGS: Lungs are clear. Heart size and pulmonary vascularity are normal. No
adenopathy. There is degenerative change in the thoracic spine.
IMPRESSION: No edema or consolidation.

## 2019-07-08 ENCOUNTER — Encounter: Payer: Self-pay | Admitting: Internal Medicine

## 2023-11-04 DIAGNOSIS — Z419 Encounter for procedure for purposes other than remedying health state, unspecified: Secondary | ICD-10-CM | POA: Diagnosis not present

## 2023-11-05 ENCOUNTER — Inpatient Hospital Stay (HOSPITAL_COMMUNITY)
Admission: EM | Admit: 2023-11-05 | Discharge: 2023-11-10 | DRG: 286 | Disposition: A | Discharge status: Home / Self Care | Attending: Internal Medicine | Admitting: Internal Medicine

## 2023-11-05 ENCOUNTER — Other Ambulatory Visit: Payer: Self-pay

## 2023-11-05 ENCOUNTER — Emergency Department (HOSPITAL_COMMUNITY)

## 2023-11-05 ENCOUNTER — Encounter (HOSPITAL_COMMUNITY): Payer: Self-pay

## 2023-11-05 DIAGNOSIS — Z8241 Family history of sudden cardiac death: Secondary | ICD-10-CM | POA: Diagnosis not present

## 2023-11-05 DIAGNOSIS — I5021 Acute systolic (congestive) heart failure: Secondary | ICD-10-CM | POA: Diagnosis present

## 2023-11-05 DIAGNOSIS — J449 Chronic obstructive pulmonary disease, unspecified: Secondary | ICD-10-CM | POA: Diagnosis not present

## 2023-11-05 DIAGNOSIS — I428 Other cardiomyopathies: Secondary | ICD-10-CM | POA: Diagnosis present

## 2023-11-05 DIAGNOSIS — I110 Hypertensive heart disease with heart failure: Principal | ICD-10-CM | POA: Diagnosis present

## 2023-11-05 DIAGNOSIS — Z7722 Contact with and (suspected) exposure to environmental tobacco smoke (acute) (chronic): Secondary | ICD-10-CM | POA: Diagnosis present

## 2023-11-05 DIAGNOSIS — R7989 Other specified abnormal findings of blood chemistry: Secondary | ICD-10-CM | POA: Diagnosis not present

## 2023-11-05 DIAGNOSIS — Z1152 Encounter for screening for COVID-19: Secondary | ICD-10-CM | POA: Diagnosis not present

## 2023-11-05 DIAGNOSIS — M1990 Unspecified osteoarthritis, unspecified site: Secondary | ICD-10-CM | POA: Diagnosis present

## 2023-11-05 DIAGNOSIS — E8770 Fluid overload, unspecified: Secondary | ICD-10-CM

## 2023-11-05 DIAGNOSIS — I493 Ventricular premature depolarization: Secondary | ICD-10-CM

## 2023-11-05 DIAGNOSIS — Z7951 Long term (current) use of inhaled steroids: Secondary | ICD-10-CM | POA: Diagnosis not present

## 2023-11-05 DIAGNOSIS — I959 Hypotension, unspecified: Secondary | ICD-10-CM | POA: Diagnosis not present

## 2023-11-05 DIAGNOSIS — I509 Heart failure, unspecified: Secondary | ICD-10-CM

## 2023-11-05 DIAGNOSIS — K30 Functional dyspepsia: Secondary | ICD-10-CM | POA: Diagnosis not present

## 2023-11-05 DIAGNOSIS — Z885 Allergy status to narcotic agent status: Secondary | ICD-10-CM

## 2023-11-05 DIAGNOSIS — I2510 Atherosclerotic heart disease of native coronary artery without angina pectoris: Secondary | ICD-10-CM | POA: Diagnosis not present

## 2023-11-05 DIAGNOSIS — Z79899 Other long term (current) drug therapy: Secondary | ICD-10-CM

## 2023-11-05 DIAGNOSIS — I5041 Acute combined systolic (congestive) and diastolic (congestive) heart failure: Secondary | ICD-10-CM | POA: Diagnosis present

## 2023-11-05 DIAGNOSIS — R001 Bradycardia, unspecified: Secondary | ICD-10-CM | POA: Diagnosis not present

## 2023-11-05 DIAGNOSIS — Z634 Disappearance and death of family member: Secondary | ICD-10-CM

## 2023-11-05 DIAGNOSIS — R0602 Shortness of breath: Secondary | ICD-10-CM

## 2023-11-05 DIAGNOSIS — R0603 Acute respiratory distress: Secondary | ICD-10-CM | POA: Diagnosis present

## 2023-11-05 DIAGNOSIS — J4489 Other specified chronic obstructive pulmonary disease: Secondary | ICD-10-CM | POA: Diagnosis present

## 2023-11-05 DIAGNOSIS — I5042 Chronic combined systolic (congestive) and diastolic (congestive) heart failure: Secondary | ICD-10-CM | POA: Diagnosis present

## 2023-11-05 DIAGNOSIS — R0682 Tachypnea, not elsewhere classified: Principal | ICD-10-CM

## 2023-11-05 DIAGNOSIS — Z87891 Personal history of nicotine dependence: Secondary | ICD-10-CM | POA: Diagnosis not present

## 2023-11-05 DIAGNOSIS — K761 Chronic passive congestion of liver: Secondary | ICD-10-CM | POA: Diagnosis not present

## 2023-11-05 DIAGNOSIS — E876 Hypokalemia: Secondary | ICD-10-CM | POA: Diagnosis not present

## 2023-11-05 DIAGNOSIS — I5031 Acute diastolic (congestive) heart failure: Secondary | ICD-10-CM | POA: Diagnosis not present

## 2023-11-05 DIAGNOSIS — Z8249 Family history of ischemic heart disease and other diseases of the circulatory system: Secondary | ICD-10-CM | POA: Diagnosis not present

## 2023-11-05 DIAGNOSIS — E785 Hyperlipidemia, unspecified: Secondary | ICD-10-CM | POA: Diagnosis not present

## 2023-11-05 DIAGNOSIS — I700 Atherosclerosis of aorta: Secondary | ICD-10-CM | POA: Diagnosis present

## 2023-11-05 DIAGNOSIS — I10 Essential (primary) hypertension: Secondary | ICD-10-CM | POA: Diagnosis present

## 2023-11-05 DIAGNOSIS — R008 Other abnormalities of heart beat: Secondary | ICD-10-CM | POA: Diagnosis present

## 2023-11-05 DIAGNOSIS — I5040 Unspecified combined systolic (congestive) and diastolic (congestive) heart failure: Secondary | ICD-10-CM

## 2023-11-05 DIAGNOSIS — G4730 Sleep apnea, unspecified: Secondary | ICD-10-CM | POA: Diagnosis present

## 2023-11-05 HISTORY — DX: Sleep apnea, unspecified: G47.30

## 2023-11-05 HISTORY — DX: Acute combined systolic (congestive) and diastolic (congestive) heart failure: I50.41

## 2023-11-05 HISTORY — DX: Unspecified combined systolic (congestive) and diastolic (congestive) heart failure: I50.40

## 2023-11-05 LAB — TROPONIN I (HIGH SENSITIVITY): Troponin I (High Sensitivity): 30 ng/L — ABNORMAL HIGH (ref <18)

## 2023-11-05 LAB — CBC
HCT: 46.3 % — ABNORMAL HIGH (ref 36.0–46.0)
Hemoglobin: 14.5 g/dL (ref 12.0–15.0)
MCH: 29.4 pg (ref 26.0–34.0)
MCHC: 31.3 g/dL (ref 30.0–36.0)
MCV: 93.7 fL (ref 80.0–100.0)
Platelets: 229 10*3/uL (ref 150–400)
RBC: 4.94 MIL/uL (ref 3.87–5.11)
RDW: 14.9 % (ref 11.5–15.5)
WBC: 3.6 10*3/uL — ABNORMAL LOW (ref 4.0–10.5)
nRBC: 0 % (ref 0.0–0.2)

## 2023-11-05 LAB — BLOOD GAS, VENOUS
Acid-Base Excess: 4.2 mmol/L — ABNORMAL HIGH (ref 0.0–2.0)
Bicarbonate: 30.3 mmol/L — ABNORMAL HIGH (ref 20.0–28.0)
O2 Saturation: 69.4 %
Patient temperature: 37
pCO2, Ven: 50 mmHg (ref 44–60)
pH, Ven: 7.39 (ref 7.25–7.43)
pO2, Ven: 42 mmHg (ref 32–45)

## 2023-11-05 LAB — RESP PANEL BY RT-PCR (RSV, FLU A&B, COVID)  RVPGX2
Influenza A by PCR: NEGATIVE
Influenza B by PCR: NEGATIVE
Resp Syncytial Virus by PCR: NEGATIVE
SARS Coronavirus 2 by RT PCR: NEGATIVE

## 2023-11-05 LAB — URINALYSIS, COMPLETE (UACMP) WITH MICROSCOPIC
Bacteria, UA: NONE SEEN
Bilirubin Urine: NEGATIVE
Glucose, UA: NEGATIVE mg/dL
Hgb urine dipstick: NEGATIVE
Ketones, ur: NEGATIVE mg/dL
Leukocytes,Ua: NEGATIVE
Nitrite: NEGATIVE
Protein, ur: NEGATIVE mg/dL
Specific Gravity, Urine: 1.004 — ABNORMAL LOW (ref 1.005–1.030)
pH: 8 (ref 5.0–8.0)

## 2023-11-05 LAB — BRAIN NATRIURETIC PEPTIDE: B Natriuretic Peptide: 1351.2 pg/mL — ABNORMAL HIGH (ref 0.0–100.0)

## 2023-11-05 LAB — BASIC METABOLIC PANEL
Anion gap: 10 (ref 5–15)
BUN: 10 mg/dL (ref 8–23)
CO2: 26 mmol/L (ref 22–32)
Calcium: 8.9 mg/dL (ref 8.9–10.3)
Chloride: 107 mmol/L (ref 98–111)
Creatinine, Ser: 0.69 mg/dL (ref 0.44–1.00)
GFR, Estimated: >60 mL/min (ref >60)
Glucose, Bld: 153 mg/dL — ABNORMAL HIGH (ref 70–99)
Potassium: 3.2 mmol/L — ABNORMAL LOW (ref 3.5–5.1)
Sodium: 143 mmol/L (ref 135–145)

## 2023-11-05 LAB — PROTEIN / CREATININE RATIO, URINE
Creatinine, Urine: <10 mg/dL
Total Protein, Urine: <6 mg/dL

## 2023-11-05 MED ORDER — FUROSEMIDE 10 MG/ML IJ SOLN
40.0000 mg | Freq: Once | INTRAMUSCULAR | Status: AC
  Administered 2023-11-05: 40 mg via INTRAVENOUS
  Filled 2023-11-05: qty 4

## 2023-11-05 MED ORDER — FUROSEMIDE 40 MG PO TABS
40.0000 mg | ORAL_TABLET | Freq: Every day | ORAL | Status: DC
  Administered 2023-11-06: 40 mg via ORAL
  Filled 2023-11-05: qty 1

## 2023-11-05 MED ORDER — ACETAMINOPHEN 650 MG RE SUPP: 650.0000 mg | Freq: Four times a day (QID) | RECTAL | Status: DC | PRN

## 2023-11-05 MED ORDER — SODIUM CHLORIDE 0.9% FLUSH
3.0000 mL | Freq: Two times a day (BID) | INTRAVENOUS | Status: DC
  Administered 2023-11-05 – 2023-11-09 (×7): 3 mL via INTRAVENOUS

## 2023-11-05 MED ORDER — ALBUTEROL SULFATE (2.5 MG/3ML) 0.083% IN NEBU: 3.0000 mL | INHALATION_SOLUTION | RESPIRATORY_TRACT | Status: DC | PRN

## 2023-11-05 MED ORDER — POLYETHYLENE GLYCOL 3350 17 G PO PACK: 17.0000 g | PACK | Freq: Every day | ORAL | Status: DC | PRN

## 2023-11-05 MED ORDER — ENOXAPARIN SODIUM 40 MG/0.4ML IJ SOSY
40.0000 mg | PREFILLED_SYRINGE | INTRAMUSCULAR | Status: DC
  Administered 2023-11-06 – 2023-11-08 (×3): 40 mg via SUBCUTANEOUS
  Filled 2023-11-05 (×4): qty 0.4

## 2023-11-05 MED ORDER — ACETAMINOPHEN 325 MG PO TABS: 650.0000 mg | ORAL_TABLET | Freq: Four times a day (QID) | ORAL | Status: DC | PRN

## 2023-11-05 MED ORDER — POTASSIUM CHLORIDE 10 MEQ/100ML IV SOLN
10.0000 meq | Freq: Once | INTRAVENOUS | Status: AC
  Administered 2023-11-05: 10 meq via INTRAVENOUS
  Filled 2023-11-05: qty 100

## 2023-11-05 MED ORDER — POTASSIUM CHLORIDE CRYS ER 20 MEQ PO TBCR
40.0000 meq | EXTENDED_RELEASE_TABLET | Freq: Once | ORAL | Status: AC
  Administered 2023-11-05: 40 meq via ORAL
  Filled 2023-11-05: qty 2

## 2023-11-05 MED ORDER — FLUTICASONE FUROATE-VILANTEROL 200-25 MCG/ACT IN AEPB
1.0000 | INHALATION_SPRAY | Freq: Every day | RESPIRATORY_TRACT | Status: DC
  Administered 2023-11-07 – 2023-11-10 (×4): 1 via RESPIRATORY_TRACT
  Filled 2023-11-05: qty 28

## 2023-11-05 NOTE — ED Provider Notes (Signed)
 [facility] AT [facility] Provider Note   CSN: [id number] Arrival date & time: 11/05/23  1253     History  Chief Complaint  Patient presents with  . Shortness of Breath    Amber Shaw is a 63 y.o. female with a history of COPD and smoking presenting to the ED with shortness of breath.  Patient ports symptom onset about 3 days ago after she spent the night with her grandson.  She reports progressively worsening dyspnea on exertion or orthopnea.  She denies any leg swelling that is chronic edema in her legs.  Denies any history of heart attack or congestive heart failure that she is aware of.  She does use breathing treatments at home, took a DuoNeb as well as her inhaler, but that did not improve her breathing symptoms.  She does not wear oxygen at home.  She says she switched to an nicotine vape about 2 weeks ago, but had a heavy smoking history prior.  HPI     Home Medications Prior to Admission medications   Medication Sig Start Date End Date Taking? Authorizing Provider  albuterol (VENTOLIN HFA) 108 (90 Base) MCG/ACT inhaler Inhale 2 puffs into the lungs every 4 (four) hours as needed for wheezing or shortness of breath. 09/01/17   Quentin Angst, MD  budesonide-formoterol (SYMBICORT) 160-4.5 MCG/ACT inhaler Take 2 puffs first thing in am and then another 2 puffs about 12 hours later. 04/20/15   Laddie Aquas, PA-C  cyclobenzaprine (FLEXERIL) 10 MG tablet Take 1 tablet (10 mg total) by mouth 3 (three) times daily as needed for muscle spasms. May take 0.5 tablet if it makes you too drowsy Patient not taking: Reported on 08/14/2016 05/03/15   Ambrose Finland, NP  fluticasone furoate-vilanterol (BREO ELLIPTA) 200-25 MCG/INH AEPB Inhale 1 puff into the lungs daily. 11/10/16   Quentin Angst, MD  ibuprofen (ADVIL,MOTRIN) 200 MG tablet Take 200 mg by mouth every 6 (six) hours as needed.    [provider]   losartan-hydrochlorothiazide (HYZAAR) 50-12.5 MG tablet TAKE 1 TABLET BY MOUTH DAILY. 09/01/17   Quentin Angst, MD  metroNIDAZOLE (FLAGYL) 500 MG tablet Take 1 tablet (500 mg total) by mouth 3 (three) times daily. 08/22/16   Quentin Angst, MD  omeprazole (PRILOSEC) 20 MG capsule TAKE 1 CAPSULE BY MOUTH DAILY. 06/15/17   Quentin Angst, MD  Vitamin D, Ergocalciferol, (DRISDOL) 50000 units CAPS capsule Take 1 capsule (50,000 Units total) by mouth every 7 (seven) days. 08/22/16   Quentin Angst, MD      Allergies    Percocet [oxycodone-acetaminophen]    Review of Systems   Review of Systems  Physical Exam Updated Vital Signs BP (!) 143/104   Pulse 98   Temp 98.1 F (36.7 C) (Oral)   Resp (!) 27   Ht 5\' 5"  (1.651 m)   Wt 80 kg   LMP 05/16/2013   SpO2 95%   BMI 29.35 kg/m  Physical Exam Constitutional:      General: She is not in acute distress. HENT:     Head: Normocephalic and atraumatic.  Eyes:     Conjunctiva/sclera: Conjunctivae normal.     Pupils: Pupils are equal, round, and reactive to light.  Cardiovascular:     Rate and Rhythm: Regular rhythm. Tachycardia present.  Pulmonary:     Effort: Pulmonary effort is normal. No respiratory distress.     Comments: Crackles or rhonchi in the mid and  lower lung fields, no audible wheezing Abdominal:     General: There is no distension.     Tenderness: There is no abdominal tenderness.  Musculoskeletal:     Right lower leg: Edema present.     Left lower leg: Edema present.  Skin:    General: Skin is warm and dry.  Neurological:     General: No focal deficit present.     Mental Status: She is alert. Mental status is at baseline.  Psychiatric:        Mood and Affect: Mood normal.        Behavior: Behavior normal.     ED Results / Procedures / Treatments   Labs (all labs ordered are listed, but only abnormal results are displayed) Labs Reviewed  BASIC METABOLIC PANEL - Abnormal; Notable for the  following components:      Result Value   Potassium 3.2 (*)    Glucose, Bld 153 (*)    All other components within normal limits  CBC - Abnormal; Notable for the following components:   WBC 3.6 (*)    HCT 46.3 (*)    All other components within normal limits  BLOOD GAS, VENOUS - Abnormal; Notable for the following components:   Bicarbonate 30.3 (*)    Acid-Base Excess 4.2 (*)    All other components within normal limits  BRAIN NATRIURETIC PEPTIDE - Abnormal; Notable for the following components:   B Natriuretic Peptide 1,351.2 (*)    All other components within normal limits  RESP PANEL BY RT-PCR (RSV, FLU A&B, COVID)  [id number]    EKG EKG Interpretation Date/Time:  Thursday November 05 2023 13:03:50 EDT Ventricular Rate:  97 PR Interval:  166 QRS Duration:  93 QT Interval:  363 QTC Calculation: 462 R Axis:   99  Text Interpretation: Sinus tachycardia Multiple ventricular premature complexes Biatrial enlargement Confirmed by Alvester Chou [phone]) on 11/05/2023 1:10:19 PM  Radiology No results found.  Procedures .Critical Care  Performed by: Terald Sleeper, MD Authorized by: Terald Sleeper, MD   Critical care provider statement:    Critical care time (minutes):  30   Critical care time was exclusive of:  Separately billable procedures and treating other patients   Critical care was necessary to treat or prevent imminent or life-threatening deterioration of the following conditions:  Respiratory failure   Critical care was time spent personally by me on the following activities:  Ordering and performing treatments and interventions, ordering and review of laboratory studies, ordering and review of radiographic studies, pulse oximetry, review of old charts, examination of patient and evaluation of patient's response to treatment   Care discussed with: admitting provider   Comments:     IV diuresis for new onset congestive heart failure with tachypnea, dyspnea on exertion,  IV potassium repletion for hypokalemia     Medications Ordered in ED Medications  potassium chloride 10 mEq in 100 mL IVPB (10 mEq Intravenous New Bag/Given 11/05/23 1536)  furosemide (LASIX) injection 40 mg (40 mg Intravenous Given 11/05/23 1546)  potassium chloride SA (KLOR-CON M) CR tablet 40 mEq (40 mEq Oral Given 11/05/23 1539)    ED Course/ Medical Decision Making/ A&P Clinical Course as of 11/05/23 1613  Thu Nov 05, 2023  1508 B Natriuretic Peptide(!): 1,351.2 [MT]  1513 Xray is consistent with pulmonary edema and BNP is elevated consistent with congestive heart failure.  IV diuresis has been ordered. [MT]  1603 Patient continues to have labored breathing, dyspnea on exertion.  She is not hypoxic but she is tachypneic.  At this point we will move to admit her for IV diuresis, potassium supplementation, and echocardiogram for new onset congestive heart failure.  The patient is in agreement [MT]  1613 Admitted to hospitalist [MT]    Clinical Course User Index [MT] Breyonna Nault, Kermit Balo, MD                                 Medical Decision Making Amount and/or Complexity of Data Reviewed Labs: ordered. Decision-making details documented in ED Course. Radiology: ordered.  Risk Prescription drug management. Decision regarding hospitalization.   This patient presents to the ED with concern for shortness of breath. This involves an extensive number of treatment options, and is a complaint that carries with it a high risk of complications and morbidity.  The differential diagnosis includes congestive heart failure exacerbation for COPD exacerbation versus anemia versus pleural effusion versus viral URI versus other  Co-morbidities that complicate the patient evaluation: History of smoking and COPD at high risk of pulmonary exacerbation  I ordered and personally interpreted labs.  The pertinent results include: BNP elevated, potassium 3.2  I ordered imaging studies including x-ray of  the chest I independently visualized and interpreted imaging which showed pulmonary edema pattern I agree with the radiologist interpretation  The patient was maintained on a cardiac monitor.  I personally viewed and interpreted the cardiac monitored which showed an underlying rhythm of: Sinus rhythm and sinus tachycardia  Per my interpretation the patient's ECG shows sinus tachycardia with no acute ischemic findings  I ordered medication including IV Lasix for diuresis, IV and oral potassium for hypokalemia repletion  I have reviewed the patients home medicines and have made adjustments as needed  Test Considered: No clear risk factors for acute pulmonary embolism and I do not feel she needed an emergent CT PE study.  She is not having any chest pain to raise concern for ACS at this time   After the interventions noted above, I reevaluated the patient and found that they have: stayed the same  Dispostion:  After consideration of the diagnostic results and the patients response to treatment, I feel that the patent would benefit from medical admission         Final Clinical Impression(s) / ED Diagnoses Final diagnoses:  Tachypnea  Shortness of breath  Acute congestive heart failure, unspecified heart failure type [facility])    Rx / DC Orders ED Discharge Orders     None         Terald Sleeper, MD 11/05/23 1605

## 2023-11-05 NOTE — ED Triage Notes (Signed)
 Patient presented to ER for shortness of breath. Patient has history of COPD, does not wear O2 at home, but over past few days patient has been getting short of breath when walking which is new.

## 2023-11-05 NOTE — Assessment & Plan Note (Addendum)
-   some possible exacerbation but not severe with the 2nd hand smoke recently in the house, but workup more consistent with CHF -Significant improvement with diuresis -Due to no insurance yet, unable to afford Breo; coupon provided for obtaining Advair at a cheaper cost

## 2023-11-05 NOTE — Assessment & Plan Note (Signed)
>>  ASSESSMENT AND PLAN FOR ACUTE COMBINED SYSTOLIC AND DIASTOLIC HEART FAILURE (HCC) WRITTEN ON 11/10/2023  1:56 PM BY Gladstone Lamer, DAVID, MD  - PND, orthopnea, cardiomegaly, pulmonary edema, LE edema, DOE/SOB - BNP 1,351 - FH of heart disease with early death in sister (age 61 yo) and dad with heart disease  - echo noted with EF 30%, indeterminate diastology, global hypokinesis, RV systolic function moderately reduced - echo also notes frequent PVC burden - seems to be improved after lasix  in the ER with minimal LE edema and improvement in dyspnea  - continue lasix  as per cardiology - appreciate cardiology evaluation; further GDMT being slowly initiated - s/p left/right heart cath on 3/17; NICM, minimal CAD; still high PVC burden - A1c 5.8% and LDL 89.  TSH normal, 0.850 - continue asa and lipitor  - plan for HF clinic followup and GDMT at discharge; caution with bradycardia developing after BB initiated and some mild hypoTN - d/c meds inlcude: valsartan , coreg , torsemide  (not yet transferred medicaid to [place] from [place] so unable to obtain Entresto  at this time)

## 2023-11-05 NOTE — H&P (Signed)
 History and Physical    Patient: Amber Shaw ZOX:[id number] DOB: 03/13/1961 DOA: 11/05/2023 DOS: the patient was seen and examined on 11/05/2023 PCP: Patient, [name]  Patient coming from: Home  Chief Complaint:  Chief Complaint  Patient presents with   Shortness of Breath   HPI: Amber Shaw is a 63 y.o. female with medical history significant of COPD. Patinet was in her usual state of health till about yesterday when she reports she got "upset" and has had persistent Sob since then. Worse with exertion. Sob with just walking 5 steps away from bed. Patient does not use oxygen at home. But seems to describe at least some degre of chronic sob with actiivtes of daily liviing such as when walking fast.  There is no repot from patient of leg swelling, cough, fever, chest pain, palptaiton or syncpe  Patient came to ER due to sob. Was not hypoxic. But felt to be fludis overloadde based on finding of edema and CXR. S.p. lasix. Patient curenlty feeling better.   Medical eval is sought.  Review of Systems: As mentioned in the history of present illness. All other systems reviewed and are negative. Past Medical History:  Diagnosis Date   Arthritis    Asthma    COPD (chronic obstructive pulmonary disease) (HCC)    Hypertension    Past Surgical History:  Procedure Laterality Date   no prior surgery     Social History:  reports that she quit smoking about 8 years ago. Her smoking use included cigarettes. She started smoking about 26 years ago. She has a 4.5 pack-year smoking history. She has never used smokeless tobacco. She reports that she does not drink alcohol and does not use drugs.  Allergies  Allergen Reactions   Percocet [Oxycodone-Acetaminophen] Other (See Comments)    Painful urination     Family History  Problem Relation Age of Onset   Colon cancer Maternal Grandfather 3   Hypertension Father    CAD Father    Cancer Other    Diabetes Other    Hyperlipidemia Other     Stroke Other    Cancer Mother     Prior to Admission medications   Medication Sig Start Date End Date Taking? Authorizing Provider  albuterol (VENTOLIN HFA) 108 (90 Base) MCG/ACT inhaler Inhale 2 puffs into the lungs every 4 (four) hours as needed for wheezing or shortness of breath. 09/01/17  Yes Quentin Angst, MD  ibuprofen (ADVIL,MOTRIN) 200 MG tablet Take 200 mg by mouth every 6 (six) hours as needed.   Yes [provider]    Physical Exam: Vitals:   11/05/23 1309 11/05/23 1445 11/05/23 1500 11/05/23 1737  BP:  (!) 143/104 (!) 166/105 (!) 184/94  Pulse:  98 61 94  Resp:  (!) 27 (!) 21 18  Temp:    98.1 F (36.7 C)  TempSrc:      SpO2:  95% 94% 99%  Weight: 80 kg     Height: 5\' 5"  (1.651 m)      General - AAOX 3 no distress. Resp - biatearl air entry vesicualr. Difusely diminished breath sounds. No wheeze, no crackle. Cvs-s1s2 normal Abodmen - soft non tedner Extremity - warm NO EDEMA. Patient has increased soft tissue but no pitting edema. Data Reviewed:  Labs on Admission:  Results for orders placed or performed during the hospital encounter of 11/05/23 (from the past 24 hours)  Resp panel by RT-PCR (RSV, Flu A&B, Covid) Anterior Nasal Swab  Status: None   Collection Time: 11/05/23  1:33 PM   Specimen: Anterior Nasal Swab  Result Value Ref Range   SARS Coronavirus 2 by RT PCR NEGATIVE NEGATIVE   Influenza A by PCR NEGATIVE NEGATIVE   Influenza B by PCR NEGATIVE NEGATIVE   Resp Syncytial Virus by PCR NEGATIVE NEGATIVE  Basic metabolic panel     Status: Abnormal   Collection Time: 11/05/23  1:34 PM  Result Value Ref Range   Sodium 143 135 - 145 mmol/L   Potassium 3.2 (L) 3.5 - 5.1 mmol/L   Chloride 107 98 - 111 mmol/L   CO2 26 22 - 32 mmol/L   Glucose, Bld 153 (H) 70 - 99 mg/dL   BUN 10 8 - 23 mg/dL   Creatinine, Ser 1.61 0.44 - 1.00 mg/dL   Calcium 8.9 8.9 - 09.6 mg/dL   GFR, Estimated >04 >54 mL/min   Anion gap 10 5 - 15  CBC     Status:  Abnormal   Collection Time: 11/05/23  1:34 PM  Result Value Ref Range   WBC 3.6 (L) 4.0 - 10.5 K/uL   RBC 4.94 3.87 - 5.11 MIL/uL   Hemoglobin 14.5 12.0 - 15.0 g/dL   HCT 09.8 (H) 11.9 - 14.7 %   MCV 93.7 80.0 - 100.0 fL   MCH 29.4 26.0 - 34.0 pg   MCHC 31.3 30.0 - 36.0 g/dL   RDW 82.9 56.2 - 13.0 %   Platelets 229 150 - 400 K/uL   nRBC 0.0 0.0 - 0.2 %  Blood gas, venous (at [facility] and [facility])     Status: Abnormal   Collection Time: 11/05/23  1:34 PM  Result Value Ref Range   pH, Ven 7.39 7.25 - 7.43   pCO2, Ven 50 44 - 60 mmHg   pO2, Ven 42 32 - 45 mmHg   Bicarbonate 30.3 (H) 20.0 - 28.0 mmol/L   Acid-Base Excess 4.2 (H) 0.0 - 2.0 mmol/L   O2 Saturation 69.4 %   Patient temperature 37.0   Brain natriuretic peptide     Status: Abnormal   Collection Time: 11/05/23  1:34 PM  Result Value Ref Range   B Natriuretic Peptide 1,351.2 (H) 0.0 - 100.0 pg/mL   Basic Metabolic Panel: Recent Labs  Lab 11/05/23 1334  NA 143  K 3.2*  CL 107  CO2 26  GLUCOSE 153*  BUN 10  CREATININE 0.69  CALCIUM 8.9   Liver Function Tests: No results for input(s): "AST", "ALT", "ALKPHOS", "BILITOT", "PROT", "ALBUMIN" in the last 168 hours. No results for input(s): "LIPASE", "AMYLASE" in the last 168 hours. No results for input(s): "AMMONIA" in the last 168 hours. CBC: Recent Labs  Lab 11/05/23 1334  WBC 3.6*  HGB 14.5  HCT 46.3*  MCV 93.7  PLT 229   Cardiac Enzymes: No results for input(s): "CKTOTAL", "CKMB", "CKMBINDEX", "TROPONINIHS" in the last 168 hours.  BNP (last 3 results) No results for input(s): "PROBNP" in the last 8760 hours. CBG: No results for input(s): "GLUCAP" in the last 168 hours.  Radiological Exams on Admission:  DG Chest 2 View Result Date: 11/05/2023 CLINICAL DATA:  COPD.  Shortness of breath. EXAM: CHEST - 2 VIEW COMPARISON:  11/23/2015 FINDINGS: The heart is enlarged. Mediastinal contours are normal. There is mild pulmonary edema. No significant pleural effusion. No  focal airspace disease. No pneumothorax. Thoracic spondylosis. IMPRESSION: Cardiomegaly with mild pulmonary edema. Electronically Signed   By: Narda Rutherford M.D.   On: 11/05/2023  16:40    EKG: Independently reviewed. NSR with PVC  No intake/output data recorded. Total I/O In: 100 [IV Piggyback:100] Out: 1000 [Urine:1000]       Assessment and Plan: * Fluid overload .  Shortness of breath is felt to be due to fluid overload although patient's physical exam is atypical for fluid overload and back at this time I do not appreciate much pitting edema on lower extremity.  This may be due to excellent clinical response to Lasix that patient got in the ER.  Patient reports having multiple episodes of micturition since then.  I will go ahead and get a echo.  Also will get urinalysis with protein quantification as well as LFTs in the morning.  I will continue the patient on 40 mg of Lasix p.o. starting tomorrow.  COPD GOLD III This is a chronic diagnosis of the patient. Start inhaled corticosteroid. Check ambulatory pulse ox. C.w. DuoNeb therapy as needed.  Given that patient has had clinical improvement with the Lasix treatment of her elevated BNP has finding of some pleural effusion on the left side of the lung.  I think COPD exacerbation is a less likely diagnosis.  Similarly I did consider if the patient could have pneumonia.  But no fever no white count not looking toxic.  Will monitor clinically.     Troponin pending. C.w. telemetyr.   Advance Care Planning:   Code Status: Full Code   Consults: nonw at this time.  Family Communication: daughter at bedside. All questions answered.  Severity of Illness: The appropriate patient status for this patient is INPATIENT. Inpatient status is judged to be reasonable and necessary in order to provide the required intensity of service to ensure the patient's safety. The patient's presenting symptoms, physical exam findings, and initial radiographic  and laboratory data in the context of their chronic comorbidities is felt to place them at high risk for further clinical deterioration. Furthermore, it is not anticipated that the patient will be medically stable for discharge from the hospital within 2 midnights of admission.   * I certify that at the point of admission it is my clinical judgment that the patient will require inpatient hospital care spanning beyond 2 midnights from the point of admission due to high intensity of service, high risk for further deterioration and high frequency of surveillance required.*  Author: Nolberto Hanlon, MD 11/05/2023 6:46 PM  For on call review [email or web address].

## 2023-11-05 NOTE — Assessment & Plan Note (Addendum)
-   PND, orthopnea, cardiomegaly, pulmonary edema, LE edema, DOE/SOB - BNP 1,351 - FH of heart disease with early death in sister (age 61 yo) - echo noted with EF 30%, indeterminate diastology, global hypokinesis, RV systolic function moderately reduced - echo also notes frequent PVC burden - seems to be improved after lasix in the ER with minimal LE edema and improvement in dyspnea  - continue lasix as per cardiology - appreciate cardiology evaluation; further GDMT being slowly initiated - plans for left/right heart cath on Monday  - follow up TSH - A1c 5.8% and LDL 89 - continue asa and lipitor

## 2023-11-06 ENCOUNTER — Inpatient Hospital Stay (HOSPITAL_COMMUNITY)

## 2023-11-06 DIAGNOSIS — R7989 Other specified abnormal findings of blood chemistry: Secondary | ICD-10-CM

## 2023-11-06 DIAGNOSIS — I5021 Acute systolic (congestive) heart failure: Secondary | ICD-10-CM

## 2023-11-06 DIAGNOSIS — I5031 Acute diastolic (congestive) heart failure: Secondary | ICD-10-CM

## 2023-11-06 LAB — BASIC METABOLIC PANEL
Anion gap: 8 (ref 5–15)
BUN: 9 mg/dL (ref 8–23)
CO2: 32 mmol/L (ref 22–32)
Calcium: 8.7 mg/dL — ABNORMAL LOW (ref 8.9–10.3)
Chloride: 99 mmol/L (ref 98–111)
Creatinine, Ser: 0.72 mg/dL (ref 0.44–1.00)
GFR, Estimated: >60 mL/min (ref >60)
Glucose, Bld: 100 mg/dL — ABNORMAL HIGH (ref 70–99)
Potassium: 3.5 mmol/L (ref 3.5–5.1)
Sodium: 139 mmol/L (ref 135–145)

## 2023-11-06 LAB — LIPID PANEL
Cholesterol: 154 mg/dL (ref 0–200)
HDL: 45 mg/dL (ref >40)
LDL Cholesterol: 89 mg/dL (ref 0–99)
Total CHOL/HDL Ratio: 3.4 ratio
Triglycerides: 102 mg/dL (ref <150)
VLDL: 20 mg/dL (ref 0–40)

## 2023-11-06 LAB — ECHOCARDIOGRAM COMPLETE
Est EF: 30
Height: 65 in
S' Lateral: 4.6 cm
Single Plane A4C EF: 33.6 %
Weight: 2857.16 [oz_av]

## 2023-11-06 LAB — CBC
HCT: 42.1 % (ref 36.0–46.0)
Hemoglobin: 13.4 g/dL (ref 12.0–15.0)
MCH: 29.6 pg (ref 26.0–34.0)
MCHC: 31.8 g/dL (ref 30.0–36.0)
MCV: 92.9 fL (ref 80.0–100.0)
Platelets: 208 10*3/uL (ref 150–400)
RBC: 4.53 MIL/uL (ref 3.87–5.11)
RDW: 14.9 % (ref 11.5–15.5)
WBC: 4.5 10*3/uL (ref 4.0–10.5)
nRBC: 0 % (ref 0.0–0.2)

## 2023-11-06 LAB — HEPATIC FUNCTION PANEL
ALT: 79 U/L — ABNORMAL HIGH (ref 0–44)
AST: 42 U/L — ABNORMAL HIGH (ref 15–41)
Albumin: 3.4 g/dL — ABNORMAL LOW (ref 3.5–5.0)
Alkaline Phosphatase: 54 U/L (ref 38–126)
Bilirubin, Direct: 0.1 mg/dL (ref 0.0–0.2)
Indirect Bilirubin: 0.8 mg/dL (ref 0.3–0.9)
Total Bilirubin: 0.9 mg/dL (ref 0.0–1.2)
Total Protein: 6.2 g/dL — ABNORMAL LOW (ref 6.5–8.1)

## 2023-11-06 LAB — TROPONIN I (HIGH SENSITIVITY): Troponin I (High Sensitivity): 34 ng/L — ABNORMAL HIGH (ref <18)

## 2023-11-06 LAB — PROTIME-INR
INR: 1.1 (ref 0.8–1.2)
Prothrombin Time: 14.4 s (ref 11.4–15.2)

## 2023-11-06 LAB — HEMOGLOBIN A1C
Hgb A1c MFr Bld: 5.8 % — ABNORMAL HIGH (ref 4.8–5.6)
Mean Plasma Glucose: 119.76 mg/dL

## 2023-11-06 LAB — APTT: aPTT: 27 s (ref 24–36)

## 2023-11-06 LAB — HIV ANTIBODY (ROUTINE TESTING W REFLEX): HIV Screen 4th Generation wRfx: NONREACTIVE

## 2023-11-06 MED ORDER — ATORVASTATIN CALCIUM 40 MG PO TABS
40.0000 mg | ORAL_TABLET | Freq: Every day | ORAL | Status: DC
  Administered 2023-11-06 – 2023-11-10 (×5): 40 mg via ORAL
  Filled 2023-11-06 (×5): qty 1

## 2023-11-06 MED ORDER — ASPIRIN 81 MG PO TBEC
81.0000 mg | DELAYED_RELEASE_TABLET | Freq: Every day | ORAL | Status: DC
  Administered 2023-11-06 – 2023-11-10 (×4): 81 mg via ORAL
  Filled 2023-11-06 (×5): qty 1

## 2023-11-06 MED ORDER — PNEUMOCOCCAL 20-VAL CONJ VACC 0.5 ML IM SUSY
0.5000 mL | PREFILLED_SYRINGE | INTRAMUSCULAR | Status: DC
  Filled 2023-11-06: qty 0.5

## 2023-11-06 MED ORDER — ACETAMINOPHEN 325 MG PO TABS
650.0000 mg | ORAL_TABLET | ORAL | Status: DC | PRN
  Administered 2023-11-09: 650 mg via ORAL

## 2023-11-06 MED ORDER — HYDRALAZINE HCL 25 MG PO TABS: 25.0000 mg | ORAL_TABLET | ORAL | Status: DC | PRN

## 2023-11-06 MED ORDER — LABETALOL HCL 5 MG/ML IV SOLN
10.0000 mg | INTRAVENOUS | Status: DC | PRN
  Administered 2023-11-06: 10 mg via INTRAVENOUS
  Filled 2023-11-06: qty 4

## 2023-11-06 NOTE — Plan of Care (Signed)

## 2023-11-06 NOTE — Plan of Care (Signed)
  Problem: Education: Goal: Knowledge of General Education information will improve Description: Including pain rating scale, medication(s)/side effects and non-pharmacologic comfort measures Outcome: Progressing   Problem: Clinical Measurements: Goal: Ability to maintain clinical measurements within normal limits will improve Outcome: Progressing Goal: Will remain free from infection Outcome: Progressing Goal: Respiratory complications will improve Outcome: Progressing   Problem: Activity: Goal: Risk for activity intolerance will decrease Outcome: Progressing   Problem: Coping: Goal: Level of anxiety will decrease Outcome: Progressing   Problem: Safety: Goal: Ability to remain free from injury will improve Outcome: Progressing

## 2023-11-06 NOTE — Assessment & Plan Note (Addendum)
-   suspect some congestive hepatopathy  -Normalized with diuresis.  No further trending needed

## 2023-11-06 NOTE — Progress Notes (Signed)
 Progress Note    Amber Shaw   UJW:[id number]  DOB: [birth year]  DOA: 11/05/2023     1 PCP: Patient, [name]  Initial CC: dyspnea   Hospital Course: Amber Shaw is a 63 yo female with PMH COPD, sleep apnea, HTN, former tobacco use, arthritis who presented with worsening dyspnea. This became a problem over the past couple weeks and typically she does not become short of breath with walking.  She also endorsed over the past week that she is now using 3 pillows to sleep instead of her prior 2 pillows and typically would sleep all 8 hours during the night, but recently is waking up in the middle of the night short of breath as well.  She does wear her CPAP mask at home.  Recently she has also been exposed to secondhand smoke from her sister for the past 3 weeks who is visiting.  Patient has quit smoking for the last 3 years.  She endorses heart disease in her family.  Her sister passed away at 21 years old and also heart disease in her dad and maternal aunt.  CXR on workup showed cardiomegaly with mild pulmonary edema.  BNP also elevated 1,351. Troponin trend was flat (30>>34).  She received a dose of Lasix in the ER with good diuresis and improvement in her symptoms.  EKG reviewed does show a history of frequent PVCs and again seen on EKG on admission.  Echo obtained after admission showing reduced EF, 30%, global hypokinesis.  Interval History:  Patient states breathing has improved since admission.  She voided a lot after Lasix in the ER. Denies any palpitations or chest pain.  Assessment and Plan: * Acute systolic (congestive) heart failure (HCC) - PND, orthopnea, cardiomegaly, pulmonary edema, LE edema, DOE/SOB - BNP 1,351 - FH of heart disease with early death in sister (age 79 yo) - echo noted with EF 30%, indeterminate diastology, global hypokinesis, RV systolic function moderately reduced - echo also notes frequent PVC burden - seems to be improved after lasix in the ER  with minimal LE edema and improvement in dyspnea  - continue oral lasix for now - will consult cardiology; suspect further inpatient workup to be commenced and/or GDMT so will hold off on further meds at this time - check A1c, lipid, LPA - start asa and lipitor   Elevated LFTs - suspect some congestive hepatopathy  - trend on diuresis   COPD GOLD III - some possible exacerbation but not severe with the 2nd hand smoke recently in the house, but workup more consistent with CHF -Continue albuterol as needed -Continue Breo Ellipta  Essential hypertension, benign - suspect further modification of meds   Old records reviewed in assessment of this patient  Antimicrobials:   DVT prophylaxis:  enoxaparin (LOVENOX) injection 40 mg Start: 11/06/23 0800 SCDs Start: 11/05/23 1712   Code Status:   Code Status: Full Code  Mobility Assessment (Last 72 Hours)     Mobility Assessment     Row Name 11/06/23 0909 11/05/23 1945 11/05/23 1824       Does patient have an order for bedrest or is patient medically unstable No - Continue assessment No - Continue assessment No - Continue assessment     What is the highest level of mobility based on the progressive mobility assessment? Level 6 (Walks independently in room and hall) - Balance while walking in room without assist - Complete Level 6 (Walks independently in room and hall) - Balance while walking  in room without assist - Complete Level 6 (Walks independently in room and hall) - Balance while walking in room without assist - Complete              Barriers to discharge: None Disposition Plan: Home HH orders placed: N/A Status is: Inpatient  Objective: Blood pressure (!) 160/84, pulse 95, temperature 97.9 F (36.6 C), temperature source Oral, resp. rate 15, height 5\' 5"  (1.651 m), weight 81 kg, last menstrual period 05/16/2013, SpO2 96%.  Examination:  Physical Exam Constitutional:      Appearance: Normal appearance.  HENT:      Head: Normocephalic and atraumatic.     Mouth/Throat:     Mouth: Mucous membranes are moist.  Eyes:     Extraocular Movements: Extraocular movements intact.  Cardiovascular:     Rate and Rhythm: Normal rate. Rhythm irregular.  Pulmonary:     Effort: Pulmonary effort is normal. No respiratory distress.     Breath sounds: Rales present. No wheezing.  Abdominal:     General: Bowel sounds are normal. There is no distension.     Palpations: Abdomen is soft.     Tenderness: There is no abdominal tenderness.  Musculoskeletal:        General: Normal range of motion.     Cervical back: Normal range of motion and neck supple.     Right lower leg: Edema (1+) present.     Left lower leg: Edema (1+) present.  Skin:    General: Skin is warm and dry.  Neurological:     General: No focal deficit present.     Mental Status: She is alert.  Psychiatric:        Mood and Affect: Mood normal.      Consultants:  Cardiology  Procedures:    Data Reviewed: Results for orders placed or performed during the hospital encounter of 11/05/23 (from the past 24 hours)  Resp panel by RT-PCR (RSV, Flu A&B, Covid) Anterior Nasal Swab     Status: None   Collection Time: 11/05/23  1:33 PM   Specimen: Anterior Nasal Swab  Result Value Ref Range   SARS Coronavirus 2 by RT PCR NEGATIVE NEGATIVE   Influenza A by PCR NEGATIVE NEGATIVE   Influenza B by PCR NEGATIVE NEGATIVE   Resp Syncytial Virus by PCR NEGATIVE NEGATIVE  Basic metabolic panel     Status: Abnormal   Collection Time: 11/05/23  1:34 PM  Result Value Ref Range   Sodium 143 135 - 145 mmol/L   Potassium 3.2 (L) 3.5 - 5.1 mmol/L   Chloride 107 98 - 111 mmol/L   CO2 26 22 - 32 mmol/L   Glucose, Bld 153 (H) 70 - 99 mg/dL   BUN 10 8 - 23 mg/dL   Creatinine, Ser 6.29 0.44 - 1.00 mg/dL   Calcium 8.9 8.9 - 52.8 mg/dL   GFR, Estimated >41 >32 mL/min   Anion gap 10 5 - 15  CBC     Status: Abnormal   Collection Time: 11/05/23  1:34 PM  Result Value  Ref Range   WBC 3.6 (L) 4.0 - 10.5 K/uL   RBC 4.94 3.87 - 5.11 MIL/uL   Hemoglobin 14.5 12.0 - 15.0 g/dL   HCT 44.0 (H) 10.2 - 72.5 %   MCV 93.7 80.0 - 100.0 fL   MCH 29.4 26.0 - 34.0 pg   MCHC 31.3 30.0 - 36.0 g/dL   RDW 36.6 44.0 - 34.7 %   Platelets 229 150 -  400 K/uL   nRBC 0.0 0.0 - 0.2 %  Blood gas, venous (at [facility])     Status: Abnormal   Collection Time: 11/05/23  1:34 PM  Result Value Ref Range   pH, Ven 7.39 7.25 - 7.43   pCO2, Ven 50 44 - 60 mmHg   pO2, Ven 42 32 - 45 mmHg   Bicarbonate 30.3 (H) 20.0 - 28.0 mmol/L   Acid-Base Excess 4.2 (H) 0.0 - 2.0 mmol/L   O2 Saturation 69.4 %   Patient temperature 37.0   Brain natriuretic peptide     Status: Abnormal   Collection Time: 11/05/23  1:34 PM  Result Value Ref Range   B Natriuretic Peptide 1,351.2 (H) 0.0 - 100.0 pg/mL  HIV Antibody (routine testing w rflx)     Status: None   Collection Time: 11/05/23  6:05 PM  Result Value Ref Range   HIV Screen 4th Generation wRfx Non Reactive Non Reactive  Troponin I (High Sensitivity)     Status: Abnormal   Collection Time: 11/05/23  6:05 PM  Result Value Ref Range   Troponin I (High Sensitivity) 30 (H) <18 ng/L  Urinalysis, Complete w Microscopic -Urine, Clean Catch     Status: Abnormal   Collection Time: 11/05/23  7:58 PM  Result Value Ref Range   Color, Urine COLORLESS (A) YELLOW   APPearance CLEAR CLEAR   Specific Gravity, Urine 1.004 (L) 1.005 - 1.030   pH 8.0 5.0 - 8.0   Glucose, UA NEGATIVE NEGATIVE mg/dL   Hgb urine dipstick NEGATIVE NEGATIVE   Bilirubin Urine NEGATIVE NEGATIVE   Ketones, ur NEGATIVE NEGATIVE mg/dL   Protein, ur NEGATIVE NEGATIVE mg/dL   Nitrite NEGATIVE NEGATIVE   Leukocytes,Ua NEGATIVE NEGATIVE   RBC / HPF 0-5 0 - 5 RBC/hpf   WBC, UA 0-5 0 - 5 WBC/hpf   Bacteria, UA NONE SEEN NONE SEEN   Squamous Epithelial / HPF 0-5 0 - 5 /HPF  Protein / creatinine ratio, urine     Status: None   Collection Time: 11/05/23  7:58 PM  Result Value Ref  Range   Creatinine, Urine <10 mg/dL   Total Protein, Urine <6 mg/dL   Protein Creatinine Ratio        0.00 - 0.15 mg/mg[Cre]  APTT     Status: None   Collection Time: 11/06/23  5:37 AM  Result Value Ref Range   aPTT 27 24 - 36 seconds  Protime-INR     Status: None   Collection Time: 11/06/23  5:37 AM  Result Value Ref Range   Prothrombin Time 14.4 11.4 - 15.2 seconds   INR 1.1 0.8 - 1.2  Basic metabolic panel     Status: Abnormal   Collection Time: 11/06/23  5:37 AM  Result Value Ref Range   Sodium 139 135 - 145 mmol/L   Potassium 3.5 3.5 - 5.1 mmol/L   Chloride 99 98 - 111 mmol/L   CO2 32 22 - 32 mmol/L   Glucose, Bld 100 (H) 70 - 99 mg/dL   BUN 9 8 - 23 mg/dL   Creatinine, Ser 1.61 0.44 - 1.00 mg/dL   Calcium 8.7 (L) 8.9 - 10.3 mg/dL   GFR, Estimated >09 >60 mL/min   Anion gap 8 5 - 15  CBC     Status: None   Collection Time: 11/06/23  5:37 AM  Result Value Ref Range   WBC 4.5 4.0 - 10.5 K/uL   RBC 4.53 3.87 - 5.11  MIL/uL   Hemoglobin 13.4 12.0 - 15.0 g/dL   HCT 13.0 86.5 - 78.4 %   MCV 92.9 80.0 - 100.0 fL   MCH 29.6 26.0 - 34.0 pg   MCHC 31.8 30.0 - 36.0 g/dL   RDW 69.6 29.5 - 28.4 %   Platelets 208 150 - 400 K/uL   nRBC 0.0 0.0 - 0.2 %  Hepatic function panel     Status: Abnormal   Collection Time: 11/06/23  5:37 AM  Result Value Ref Range   Total Protein 6.2 (L) 6.5 - 8.1 g/dL   Albumin 3.4 (L) 3.5 - 5.0 g/dL   AST 42 (H) 15 - 41 U/L   ALT 79 (H) 0 - 44 U/L   Alkaline Phosphatase 54 38 - 126 U/L   Total Bilirubin 0.9 0.0 - 1.2 mg/dL   Bilirubin, Direct 0.1 0.0 - 0.2 mg/dL   Indirect Bilirubin 0.8 0.3 - 0.9 mg/dL  Troponin I (High Sensitivity)     Status: Abnormal   Collection Time: 11/06/23  8:20 AM  Result Value Ref Range   Troponin I (High Sensitivity) 34 (H) <18 ng/L    I have reviewed pertinent nursing notes, vitals, labs, and images as necessary. I have ordered labwork to follow up on as indicated.  I have reviewed the last notes from staff over  past 24 hours. I have discussed patient's care plan and test results with nursing staff, CM/SW, and other staff as appropriate.  Time spent: Greater than 50% of the 55 minute visit was spent in counseling/coordination of care for the patient as laid out in the A&P.   LOS: 1 day   Lewie Chamber, MD [facility] 11/06/2023, 12:51 PM

## 2023-11-06 NOTE — Progress Notes (Signed)
  Echocardiogram 2D Echocardiogram has been performed.  Delcie Roch 11/06/2023, 10:01 AM

## 2023-11-06 NOTE — TOC Initial Note (Signed)
 Transition of Care [facility]) - Initial/Assessment Note    Patient Details  Name: Amber Shaw MRN: [id number] Date of Birth: 29-Sep-1960  [facility] CM/SW Contact:    Jessie Foot, RN Phone Number: 11/06/2023, 11:51 AM  Clinical Narrative:                 Presented for heart failure and shock. Patient lives alone in an apartment.No DME. No PCP. Appointment scheduled on discharge AVS. Uses daughters for transportation to appointments. Encouraged patient to change insurance to [facility]. No additional  TOC needs identified.     Barriers to Discharge: Continued Medical Work up   Patient Goals and CMS Choice Patient states their goals for this hospitalization and ongoing recovery are:: Return home   Choice offered to / list presented to : NA      Expected Discharge Plan and Services In-house Referral: NA   Post Acute Care Choice: NA Living arrangements for the past 2 months: Apartment                 DME Arranged: N/A DME Agency: NA         HH Agency: NA        Prior Living Arrangements/Services Living arrangements for the past 2 months: Apartment Lives with:: Self Patient language and need for interpreter reviewed:: Yes Do you feel safe going back to the place where you live?: Yes      Need for Family Participation in Patient Care: No (Comment) Care giver support system in place?: Yes (comment)   Criminal Activity/Legal Involvement Pertinent to Current Situation/Hospitalization: No - Comment as needed  Activities of Daily Living   ADL Screening (condition at time of admission) Independently performs ADLs?: Yes (appropriate for developmental age) Is the patient deaf or have difficulty hearing?: No Does the patient have difficulty seeing, even when wearing glasses/contacts?: No Does the patient have difficulty concentrating, remembering, or making decisions?: No  Permission Sought/Granted Permission sought to share information with : Case  Manager Permission granted to share information with : Yes, Verbal Permission Granted              Emotional Assessment Appearance:: Appears stated age Attitude/Demeanor/Rapport: Engaged Affect (typically observed): Accepting, Appropriate Orientation: : Oriented to Self, Oriented to Place, Oriented to  Time, Oriented to Situation Alcohol / Substance Use: Not Applicable Psych Involvement: No (comment)  Admission diagnosis:  Shortness of breath [R06.02] Tachypnea [R06.82] Fluid overload [E87.70] Acute congestive heart failure, unspecified heart failure type [facility]) [I50.9] Patient Active Problem List   Diagnosis Date Noted   Fluid overload 11/05/2023   Pap smear for cervical cancer screening 08/14/2016   Colon cancer screening 08/14/2016   Gastroesophageal reflux disease 08/14/2016   COPD GOLD III 12/06/2014   Dyspnea 12/05/2014   Community acquired pneumonia 11/13/2014   CAP (community acquired pneumonia) 11/13/2014   Lower urinary tract infectious disease 11/13/2014   Candida UTI 03/20/2014   Preventative health care 03/20/2014   Essential hypertension, benign 04/21/2013   Chronic bronchitis (HCC) 04/21/2013   Nicotine dependence 04/21/2013   PCP:  Patient, [name] Pharmacy:   [facility] [address] ([place]), [place] - [address] [place] ([place]) [place] [address] Phone: [phone] Fax: [phone]  [facility] [address] [place] [address] Phone: [phone] Fax: [phone]     Social Drivers of Health (SDOH) Social History: SDOH Screenings   Food Insecurity: No  Food Insecurity (11/05/2023)  Housing: Low Risk  (11/05/2023)  Transportation Needs: No Transportation Needs (11/05/2023)  Utilities: Not At Risk (11/05/2023)  Tobacco Use: Medium Risk (11/05/2023)   SDOH Interventions:     Readmission Risk Interventions     No data to display

## 2023-11-06 NOTE — Hospital Course (Addendum)
 Amber Shaw is a 63 yo female with PMH COPD, sleep apnea, HTN, former tobacco use, arthritis who presented with worsening dyspnea. This became a problem over the past couple weeks and typically she does not become short of breath with walking.  She also endorsed over the past week that she is now using 3 pillows to sleep instead of her prior 2 pillows and typically would sleep all 8 hours during the night, but recently is waking up in the middle of the night short of breath as well.  She does wear her CPAP mask at home.  Recently she has also been exposed to secondhand smoke from her sister for the past 3 weeks who is visiting.  Patient has quit smoking for the last 3 years.  She endorses heart disease in her family.  Her sister passed away at 4 years old and also heart disease in her dad and maternal aunt.  CXR on workup showed cardiomegaly with mild pulmonary edema.  BNP also elevated 1,351. Troponin trend was flat (30>>34).  She received a dose of Lasix in the ER with good diuresis and improvement in her symptoms.  EKG reviewed does show a history of frequent PVCs and again seen on EKG on admission.  Echo obtained after admission showing reduced EF, 30%, global hypokinesis. She underwent cardiology evaluation and right/left heart cath on 11/09/2023.

## 2023-11-06 NOTE — Assessment & Plan Note (Addendum)
-   Discharged with Coreg, valsartan, torsemide -Patient instructed to watch blood pressure and heart rate at discharge as some borderline values with new medications

## 2023-11-07 ENCOUNTER — Encounter (HOSPITAL_COMMUNITY): Payer: Self-pay | Admitting: Internal Medicine

## 2023-11-07 DIAGNOSIS — J449 Chronic obstructive pulmonary disease, unspecified: Secondary | ICD-10-CM | POA: Diagnosis not present

## 2023-11-07 DIAGNOSIS — I5021 Acute systolic (congestive) heart failure: Secondary | ICD-10-CM | POA: Diagnosis not present

## 2023-11-07 LAB — BASIC METABOLIC PANEL
Anion gap: 10 (ref 5–15)
BUN: 11 mg/dL (ref 8–23)
CO2: 30 mmol/L (ref 22–32)
Calcium: 8.5 mg/dL — ABNORMAL LOW (ref 8.9–10.3)
Chloride: 102 mmol/L (ref 98–111)
Creatinine, Ser: 0.69 mg/dL (ref 0.44–1.00)
GFR, Estimated: >60 mL/min (ref >60)
Glucose, Bld: 94 mg/dL (ref 70–99)
Potassium: 3.4 mmol/L — ABNORMAL LOW (ref 3.5–5.1)
Sodium: 142 mmol/L (ref 135–145)

## 2023-11-07 LAB — CBC WITH DIFFERENTIAL/PLATELET
Abs Immature Granulocytes: 0.01 10*3/uL (ref 0.00–0.07)
Basophils Absolute: 0 10*3/uL (ref 0.0–0.1)
Basophils Relative: 0 %
Eosinophils Absolute: 0 10*3/uL (ref 0.0–0.5)
Eosinophils Relative: 1 %
HCT: 44.3 % (ref 36.0–46.0)
Hemoglobin: 14 g/dL (ref 12.0–15.0)
Immature Granulocytes: 0 %
Lymphocytes Relative: 55 %
Lymphs Abs: 1.6 10*3/uL (ref 0.7–4.0)
MCH: 29.5 pg (ref 26.0–34.0)
MCHC: 31.6 g/dL (ref 30.0–36.0)
MCV: 93.5 fL (ref 80.0–100.0)
Monocytes Absolute: 0.4 10*3/uL (ref 0.1–1.0)
Monocytes Relative: 14 %
Neutro Abs: 0.9 10*3/uL — ABNORMAL LOW (ref 1.7–7.7)
Neutrophils Relative %: 30 %
Platelets: 224 10*3/uL (ref 150–400)
RBC: 4.74 MIL/uL (ref 3.87–5.11)
RDW: 14.8 % (ref 11.5–15.5)
WBC: 3 10*3/uL — ABNORMAL LOW (ref 4.0–10.5)
nRBC: 0 % (ref 0.0–0.2)

## 2023-11-07 LAB — TSH: TSH: 0.85 u[IU]/mL (ref 0.350–4.500)

## 2023-11-07 LAB — MAGNESIUM: Magnesium: 2 mg/dL (ref 1.7–2.4)

## 2023-11-07 MED ORDER — POTASSIUM CHLORIDE CRYS ER 20 MEQ PO TBCR
40.0000 meq | EXTENDED_RELEASE_TABLET | Freq: Once | ORAL | Status: AC
  Administered 2023-11-07: 40 meq via ORAL
  Filled 2023-11-07: qty 2

## 2023-11-07 MED ORDER — FUROSEMIDE 10 MG/ML IJ SOLN
40.0000 mg | Freq: Two times a day (BID) | INTRAMUSCULAR | Status: DC
  Administered 2023-11-07 – 2023-11-09 (×4): 40 mg via INTRAVENOUS
  Filled 2023-11-07 (×6): qty 4

## 2023-11-07 MED ORDER — SACUBITRIL-VALSARTAN 24-26 MG PO TABS
1.0000 | ORAL_TABLET | Freq: Two times a day (BID) | ORAL | Status: DC
  Administered 2023-11-07 – 2023-11-10 (×5): 1 via ORAL
  Filled 2023-11-07 (×7): qty 1

## 2023-11-07 NOTE — Plan of Care (Signed)

## 2023-11-07 NOTE — Plan of Care (Signed)
   Problem: Nutrition: Goal: Adequate nutrition will be maintained Outcome: Progressing   Problem: Pain Managment: Goal: General experience of comfort will improve and/or be controlled Outcome: Progressing   Problem: Safety: Goal: Ability to remain free from injury will improve Outcome: Progressing

## 2023-11-07 NOTE — Progress Notes (Signed)
 Progress Note    Amber Shaw   XBJ:[id number]  DOB: [birth year]  DOA: 11/05/2023     2 PCP: Patient, [name]  Initial CC: dyspnea   Hospital Course: Amber Shaw is a 63 yo female with PMH COPD, sleep apnea, HTN, former tobacco use, arthritis who presented with worsening dyspnea. This became a problem over the past couple weeks and typically she does not become short of breath with walking.  She also endorsed over the past week that she is now using 3 pillows to sleep instead of her prior 2 pillows and typically would sleep all 8 hours during the night, but recently is waking up in the middle of the night short of breath as well.  She does wear her CPAP mask at home.  Recently she has also been exposed to secondhand smoke from her sister for the past 3 weeks who is visiting.  Patient has quit smoking for the last 3 years.  She endorses heart disease in her family.  Her sister passed away at 89 years old and also heart disease in her dad and maternal aunt.  CXR on workup showed cardiomegaly with mild pulmonary edema.  BNP also elevated 1,351. Troponin trend was flat (30>>34).  She received a dose of Lasix in the ER with good diuresis and improvement in her symptoms.  EKG reviewed does show a history of frequent PVCs and again seen on EKG on admission.  Echo obtained after admission showing reduced EF, 30%, global hypokinesis.  Interval History:  Even more improvement in dyspnea (she says gone) and was able to ambulate in the halls multiple times yesterday.  Evaluated by cardiology this morning with plans for heart cath on Monday.   Assessment and Plan: * Acute systolic (congestive) heart failure (HCC) - PND, orthopnea, cardiomegaly, pulmonary edema, LE edema, DOE/SOB - BNP 1,351 - FH of heart disease with early death in sister (age 23 yo) - echo noted with EF 30%, indeterminate diastology, global hypokinesis, RV systolic function moderately reduced - echo also notes frequent PVC  burden - seems to be improved after lasix in the ER with minimal LE edema and improvement in dyspnea  - continue lasix as per cardiology - appreciate cardiology evaluation; further GDMT being slowly initiated - plans for left/right heart cath on Monday  - follow up TSH - A1c 5.8% and LDL 89 - continue asa and lipitor   Elevated LFTs - suspect some congestive hepatopathy  - trend on diuresis   COPD GOLD III - some possible exacerbation but not severe with the 2nd hand smoke recently in the house, but workup more consistent with CHF -Continue albuterol as needed -Continue Breo Ellipta  Essential hypertension, benign - suspect further modification of meds   Old records reviewed in assessment of this patient  Antimicrobials:   DVT prophylaxis:  enoxaparin (LOVENOX) injection 40 mg Start: 11/06/23 0800 SCDs Start: 11/05/23 1712   Code Status:   Code Status: Full Code  Mobility Assessment (Last 72 Hours)     Mobility Assessment     Row Name 11/06/23 2100 11/06/23 0909 11/06/23 0900 11/05/23 1945 11/05/23 1824   Does patient have an order for bedrest or is patient medically unstable No - Continue assessment No - Continue assessment No - Continue assessment No - Continue assessment No - Continue assessment   What is the highest level of mobility based on the progressive mobility assessment? Level 6 (Walks independently in room and hall) - Balance while walking in room  without assist - Complete Level 6 (Walks independently in room and hall) - Balance while walking in room without assist - Complete Level 6 (Walks independently in room and hall) - Balance while walking in room without assist - Complete Level 6 (Walks independently in room and hall) - Balance while walking in room without assist - Complete Level 6 (Walks independently in room and hall) - Balance while walking in room without assist - Complete            Barriers to discharge: None Disposition Plan: Home HH  orders placed: N/A Status is: Inpatient  Objective: Blood pressure 117/77, pulse 80, temperature 98.1 F (36.7 C), resp. rate 20, height 5\' 5"  (1.651 m), weight 74.4 kg, last menstrual period 05/16/2013, SpO2 94%.  Examination:  Physical Exam Constitutional:      Appearance: Normal appearance.  HENT:     Head: Normocephalic and atraumatic.     Mouth/Throat:     Mouth: Mucous membranes are moist.  Eyes:     Extraocular Movements: Extraocular movements intact.  Cardiovascular:     Rate and Rhythm: Normal rate. Rhythm irregular.  Pulmonary:     Effort: Pulmonary effort is normal. No respiratory distress.     Breath sounds: Rales present. No wheezing.  Abdominal:     General: Bowel sounds are normal. There is no distension.     Palpations: Abdomen is soft.     Tenderness: There is no abdominal tenderness.  Musculoskeletal:        General: Normal range of motion.     Cervical back: Normal range of motion and neck supple.     Right lower leg: Edema (1+) present.     Left lower leg: Edema (1+) present.  Skin:    General: Skin is warm and dry.  Neurological:     General: No focal deficit present.     Mental Status: She is alert.  Psychiatric:        Mood and Affect: Mood normal.      Consultants:  Cardiology  Procedures:    Data Reviewed: Results for orders placed or performed during the hospital encounter of 11/05/23 (from the past 24 hours)  Basic metabolic panel     Status: Abnormal   Collection Time: 11/07/23  6:43 AM  Result Value Ref Range   Sodium 142 135 - 145 mmol/L   Potassium 3.4 (L) 3.5 - 5.1 mmol/L   Chloride 102 98 - 111 mmol/L   CO2 30 22 - 32 mmol/L   Glucose, Bld 94 70 - 99 mg/dL   BUN 11 8 - 23 mg/dL   Creatinine, Ser 9.14 0.44 - 1.00 mg/dL   Calcium 8.5 (L) 8.9 - 10.3 mg/dL   GFR, Estimated >78 >29 mL/min   Anion gap 10 5 - 15  CBC with Differential/Platelet     Status: Abnormal   Collection Time: 11/07/23  6:43 AM  Result Value Ref Range    WBC 3.0 (L) 4.0 - 10.5 K/uL   RBC 4.74 3.87 - 5.11 MIL/uL   Hemoglobin 14.0 12.0 - 15.0 g/dL   HCT 56.2 13.0 - 86.5 %   MCV 93.5 80.0 - 100.0 fL   MCH 29.5 26.0 - 34.0 pg   MCHC 31.6 30.0 - 36.0 g/dL   RDW 78.4 69.6 - 29.5 %   Platelets 224 150 - 400 K/uL   nRBC 0.0 0.0 - 0.2 %   Neutrophils Relative % 30 %   Neutro Abs 0.9 (L) 1.7 -  7.7 K/uL   Lymphocytes Relative 55 %   Lymphs Abs 1.6 0.7 - 4.0 K/uL   Monocytes Relative 14 %   Monocytes Absolute 0.4 0.1 - 1.0 K/uL   Eosinophils Relative 1 %   Eosinophils Absolute 0.0 0.0 - 0.5 K/uL   Basophils Relative 0 %   Basophils Absolute 0.0 0.0 - 0.1 K/uL   Immature Granulocytes 0 %   Abs Immature Granulocytes 0.01 0.00 - 0.07 K/uL  Magnesium     Status: None   Collection Time: 11/07/23  6:43 AM  Result Value Ref Range   Magnesium 2.0 1.7 - 2.4 mg/dL    I have reviewed pertinent nursing notes, vitals, labs, and images as necessary. I have ordered labwork to follow up on as indicated.  I have reviewed the last notes from staff over past 24 hours. I have discussed patient's care plan and test results with nursing staff, CM/SW, and other staff as appropriate.  Time spent: Greater than 50% of the 55 minute visit was spent in counseling/coordination of care for the patient as laid out in the A&P.   LOS: 2 days   Lewie Chamber, MD [facility] 11/07/2023, 1:18 PM

## 2023-11-07 NOTE — Consult Note (Signed)
 Cardiology Consultation:   Patient ID: Amber Shaw; [id number]; 1961-05-06   Admit date: 11/05/2023 Date of Consult: 11/07/2023  Primary Care Provider: Patient, [name] Primary Cardiologist: None  Primary Electrophysiologist:  None   Patient Profile:   Amber Shaw is a 63 y.o. female with a hx of COPD who is being seen today for the evaluation of SOB  at the request of Dr. Frederick Peers.  Marland Kitchen  History of Present Illness:   Ms. Boldin presented with increase dyspnea on the background of COPD.    Objective findings in the ED included. CXR as below with volume overload.  BNP was 1351.  Trop is mildly elevated but flat.   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  Global hypo.  Mild LVH concentric.  Moderately reduced RV function.  Mod LAE.   EKG  as below.      She reports that her symptoms started maybe a month ago.  She started getting some dyspnea with exertion such as walking around in her home.  She will walk for exercise and get some dyspnea.  She developed a mild cough.  However, really got worse over the last couple of days.  She tried taking some of her bronchodilators.  She has had COPD but she has not had a follow-up with pulmonary or use her meds routinely.  She has never been on oxygen.  It has been manageable.  She was up in [place] helping a relative for a while.  She was interestingly down in [place] where she got bit by a bug and actually sound like she was hospitalized with possibly a cellulitis requiring percutaneous drainage.  I cannot find any of these records.  She came back here today she lives.  She has been working on getting her new apartment fixed up.  She has a daughter and granddaughter here.  She said she just had the shortness of breath that she could not handle anymore.  She came to the emergency room.  She has not had any chest pressure, neck or arm discomfort.  She has not had any palpitations, presyncope or syncope.  She has probably  had some PND and orthopnea.  She has not had any edema and does not think she has had any weight gain.  Of interest her father died in his 8s.  It sounds like he had episodes of sudden death requiring CPR and cardioversion and died of some "rare heart condition."  She does not know the details of this.  Past Medical History:  Diagnosis Date   Arthritis    Asthma    COPD (chronic obstructive pulmonary disease) (HCC)    Hypertension     Past Surgical History:  Procedure Laterality Date   no prior surgery       Home Medications:  Prior to Admission medications   Medication Sig Start Date End Date Taking? Authorizing Provider  albuterol (VENTOLIN HFA) 108 (90 Base) MCG/ACT inhaler Inhale 2 puffs into the lungs every 4 (four) hours as needed for wheezing or shortness of breath. 09/01/17  Yes Quentin Angst, MD  ibuprofen (ADVIL,MOTRIN) 200 MG tablet Take 200 mg by mouth every 6 (six) hours as needed.   Yes [provider]  She reports she also was on losartan and a "cholesterol medicine" prior to admission.  Inpatient Medications: Scheduled Meds:  aspirin EC  81 mg Oral Daily   atorvastatin  40 mg Oral Daily   enoxaparin (LOVENOX) injection  40 mg Subcutaneous Q24H   fluticasone furoate-vilanterol  1 puff Inhalation Daily   furosemide  40 mg Intravenous BID   pneumococcal 20-valent conjugate vaccine  0.5 mL Intramuscular Tomorrow-1000   potassium chloride  40 mEq Oral Once   sodium chloride flush  3 mL Intravenous Q12H   Continuous Infusions:  PRN Meds: acetaminophen **OR** [DISCONTINUED] acetaminophen, albuterol, hydrALAZINE, polyethylene glycol  Allergies:    Allergies  Allergen Reactions   Percocet [Oxycodone-Acetaminophen] Other (See Comments)    Painful urination     Social History:   Social History   Socioeconomic History   Marital status: Single    Spouse name: Not on file   Number of children: Not on file   Years of education: Not on file    Highest education level: Not on file  Occupational History   Not on file  Tobacco Use   Smoking status: Former    Current packs/day: 0.00    Average packs/day: 0.3 packs/day for 18.0 years (4.5 ttl pk-yrs)    Types: Cigarettes    Start date: 11/12/1996    Quit date: 11/13/2014    Years since quitting: 8.9   Smokeless tobacco: Never  Substance and Sexual Activity   Alcohol use: No    Alcohol/week: 0.0 standard drinks of alcohol   Drug use: No   Sexual activity: Not on file  Other Topics Concern   Not on file  Social History Narrative   Not on file   Social Drivers of Health   Financial Resource Strain: Not on file  Food Insecurity: No Food Insecurity (11/05/2023)   Hunger Vital Sign    Worried About Running Out of Food in the Last Year: Never true    Ran Out of Food in the Last Year: Never true  Transportation Needs: No Transportation Needs (11/05/2023)   PRAPARE - Administrator, Civil Service (Medical): No    Lack of Transportation (Non-Medical): No  Physical Activity: Not on file  Stress: Not on file  Social Connections: Not on file  Intimate Partner Violence: Not At Risk (11/05/2023)   Humiliation, Afraid, Rape, and Kick questionnaire    Fear of Current or Ex-Partner: No    Emotionally Abused: No    Physically Abused: No    Sexually Abused: No    Family History:    Family History  Problem Relation Age of Onset   Colon cancer Maternal Grandfather 70   Hypertension Father    CAD Father    Cancer Other    Diabetes Other    Hyperlipidemia Other    Stroke Other    Cancer Mother      ROS:  Please see the history of present illness.  . All other ROS reviewed and negative.     Physical Exam/Data:   Vitals:   11/06/23 0500 11/06/23 1916 11/06/23 2032 11/07/23 0457  BP:  (!) 142/101 110/64 (!) 132/97  Pulse:  87 61 99  Resp:  18  16  Temp:  98.5 F (36.9 C)  98.4 F (36.9 C)  TempSrc:      SpO2:  97% 99% 95%  Weight: 81 kg     Height:         Intake/Output Summary (Last 24 hours) at 11/07/2023 0834 Last data filed at 11/06/2023 1020 Gross per 24 hour  Intake 356 ml  Output --  Net 356 ml   Filed Weights   11/05/23 1309 11/06/23 0500  Weight: 80 kg 81 kg  Body mass index is 29.72 kg/m.  GENERAL:  Well appearing HEENT:   Pupils equal round and reactive, fundi not visualized, oral mucosa unremarkable NECK:  No  jugular venous distention, waveform within normal limits, carotid upstroke brisk and symmetric, no bruits, no thyromegaly LYMPHATICS:  No cervical, inguinal adenopathy LUNGS:   Clear to auscultation bilaterally BACK:  No CVA tenderness CHEST:   Unremarkable HEART:  PMI not displaced or sustained,S1 and S2 within normal limits, no S3, no S4, no clicks, no rubs,  murmurs ABD:  Flat, positive bowel sounds normal in frequency in pitch, no bruits, no rebound, no guarding, no midline pulsatile mass, no hepatomegaly, no splenomegaly EXT:  2 plus pulses throughout, no  edema, no cyanosis no clubbing SKIN:  No rashes no nodules NEURO:   Cranial nerves II through XII grossly intact, motor grossly intact throughout PSYCH:    Cognitively intact, oriented to person place and time   EKG:  The EKG was personally reviewed and demonstrates:  NSR, rate 97, minimal voltage for LVH.  Premature ectopic complexes.  Biatrial enlargement.  Non specific ST T wave changes.   Telemetry:  Telemetry was personally reviewed and demonstrates:  NSR  Relevant CV Studies: See echo above.   Laboratory Data:  Chemistry Recent Labs  Lab 11/05/23 1334 11/06/23 0537 11/07/23 0643  NA 143 139 142  K 3.2* 3.5 3.4*  CL 107 99 102  CO2 26 32 30  GLUCOSE 153* 100* 94  BUN 10 9 11   CREATININE 0.69 0.72 0.69  CALCIUM 8.9 8.7* 8.5*  GFRNONAA >60 >60 >60  ANIONGAP 10 8 10     Recent Labs  Lab 11/06/23 0537  PROT 6.2*  ALBUMIN 3.4*  AST 42*  ALT 79*  ALKPHOS 54  BILITOT 0.9   Hematology Recent Labs  Lab 11/05/23 1334  11/06/23 0537 11/07/23 0643  WBC 3.6* 4.5 3.0*  RBC 4.94 4.53 4.74  HGB 14.5 13.4 14.0  HCT 46.3* 42.1 44.3  MCV 93.7 92.9 93.5  MCH 29.4 29.6 29.5  MCHC 31.3 31.8 31.6  RDW 14.9 14.9 14.8  PLT 229 208 224   Cardiac EnzymesNo results for input(s): "TROPONINI" in the last 168 hours. No results for input(s): "TROPIPOC" in the last 168 hours.  BNP Recent Labs  Lab 11/05/23 1334  BNP 1,351.2*    DDimer No results for input(s): "DDIMER" in the last 168 hours.  Radiology/Studies:  ECHOCARDIOGRAM COMPLETE Result Date: 11/06/2023    ECHOCARDIOGRAM REPORT   Patient Name:   Amber Shaw Date of Exam: 11/06/2023 Medical Rec #:  [id number]     Height:       65.0 in Accession #:    [id number]    Weight:       178.6 lb Date of Birth:  16-Jul-1961     BSA:          1.885 m Patient Age:    63 years      BP:           160/84 mmHg Patient Gender: F             HR:           84 bpm. Exam Location:  Inpatient Procedure: 2D Echo (Both Spectral and Color Flow Doppler were utilized during            procedure). Indications:    acute diastolic chf  History:        Patient has no prior history of Echocardiogram examinations.  COPD, Signs/Symptoms:Shortness of Breath; Risk Factors:Former                 Smoker and Hypertension.  Sonographer:    Delcie Roch RDCS Referring Phys: [id number] [facility] GOEL IMPRESSIONS  1. Left ventricular ejection fraction, by estimation, is 30%. The left ventricle has moderately decreased function. The left ventricle demonstrates global hypokinesis. There is mild concentric left ventricular hypertrophy. Left ventricular diastolic parameters are indeterminate.  2. Right ventricular systolic function is moderately reduced. The right ventricular size is normal. There is normal pulmonary artery systolic pressure.  3. Left atrial size was moderately dilated.  4. The mitral valve is normal in structure. Mild mitral valve regurgitation. No evidence of mitral stenosis.  5. The aortic  valve is tricuspid. There is mild calcification of the aortic valve. Aortic valve regurgitation is not visualized. Aortic valve sclerosis/calcification is present, without any evidence of aortic stenosis.  6. The inferior vena cava is normal in size with greater than 50% respiratory variability, suggesting right atrial pressure of 3 mmHg. 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 FINDINGS  Left Ventricle: Left ventricular ejection fraction, by estimation, is 30%. The left ventricle has moderately decreased function. The left ventricle demonstrates global hypokinesis. The left ventricular internal cavity size was normal in size. There is mild concentric left ventricular hypertrophy. Left ventricular diastolic parameters are indeterminate. Right Ventricle: The right ventricular size is normal. No increase in right ventricular wall thickness. Right ventricular systolic function is moderately reduced. There is normal pulmonary artery systolic pressure. The tricuspid regurgitant velocity is 2.58 m/s, and with an assumed right atrial pressure of 5 mmHg, the estimated right ventricular systolic pressure is 31.6 mmHg. Left Atrium: Left atrial size was moderately dilated. Right Atrium: Right atrial size was normal in size. Pericardium: There is no evidence of pericardial effusion. Mitral Valve: The mitral valve is normal in structure. Mild mitral valve regurgitation. No evidence of mitral valve stenosis. Tricuspid Valve: The tricuspid valve is normal in structure. Tricuspid valve regurgitation is mild . No evidence of tricuspid stenosis. Aortic Valve: The aortic valve is tricuspid. There is mild calcification of the aortic valve. Aortic valve regurgitation is not visualized. Aortic valve sclerosis/calcification is present, without any evidence of aortic stenosis. Pulmonic Valve: The pulmonic valve was normal in structure. Pulmonic valve  regurgitation is not visualized. No evidence of pulmonic stenosis. Aorta: The aortic root is normal in size and structure. Venous: The inferior vena cava is normal in size with greater than 50% respiratory variability, suggesting right atrial pressure of 3 mmHg. IAS/Shunts: No atrial level shunt detected by color flow Doppler.  LEFT VENTRICLE PLAX 2D LVIDd:         5.30 cm      Diastology LVIDs:         4.60 cm      LV e' medial:  5.77 cm/s LV PW:         1.10 cm      LV e' lateral: 8.59 cm/s LV IVS:        1.00 cm LVOT diam:     2.00 cm LVOT Area:     3.14 cm  LV Volumes (MOD) LV vol d, MOD A4C: 116.0 ml LV vol s, MOD A4C: 77.0 ml LV SV MOD A4C:     116.0 ml RIGHT VENTRICLE          IVC RV Basal diam:  2.60 cm  IVC diam: 1.50 cm TAPSE (M-mode):  2.0 cm LEFT ATRIUM             Index        RIGHT ATRIUM           Index LA diam:        3.80 cm 2.02 cm/m   RA Area:     15.50 cm LA Vol (A2C):   92.8 ml 49.23 ml/m  RA Volume:   36.00 ml  19.10 ml/m LA Vol (A4C):   67.1 ml 35.59 ml/m LA Biplane Vol: 80.6 ml 42.76 ml/m   AORTA Ao Root diam: 2.90 cm Ao Asc diam:  3.00 cm TRICUSPID VALVE TR Peak grad:   26.6 mmHg TR Vmax:        258.00 cm/s  SHUNTS Systemic Diam: 2.00 cm Arvilla Meres MD Electronically signed by Arvilla Meres MD Signature Date/Time: 11/06/2023/11:37:09 AM    Final    DG Chest 2 View Result Date: 11/05/2023 CLINICAL DATA:  COPD.  Shortness of breath. EXAM: CHEST - 2 VIEW COMPARISON:  11/23/2015 FINDINGS: The heart is enlarged. Mediastinal contours are normal. There is mild pulmonary edema. No significant pleural effusion. No focal airspace disease. No pneumothorax. Thoracic spondylosis. IMPRESSION: Cardiomegaly with mild pulmonary edema. Electronically Signed   By: Narda Rutherford M.D.   On: 11/05/2023 16:40    Assessment and Plan:   Acute systolic HF:  EF as above.  Etiology not yet determined.  HIV pending.   Draw TSH.  Intake and output not complete.  Given IV Lasix.   I have written  for continued IV Lasix.  I am going to start Entresto.  We will next add likely Farxiga, low-dose beta-blocker and spironolactone.  She needs a right and left heart cath.  The patient understands that risks included but are not limited to stroke (1 in 1000), death (1 in 1000), kidney failure [usually temporary] (1 in 500), bleeding (1 in 200), allergic reaction [possibly serious] (1 in 200).  The patient understands and agrees to proceed.   I will plan this electively for Monday.  She needs to get her family history as her father situation might be contributory.  MR:  Mild.  Will follow clinically.    For questions or updates, please contact [facility] Please consult [email or web address] for contact info under Cardiology/STEMI.   Signed, Rollene Rotunda, MD  11/07/2023 8:34 AM

## 2023-11-07 NOTE — Progress Notes (Signed)
   11/07/23 2151  BiPAP/CPAP/SIPAP  $ Non-Invasive Home Ventilator  Initial  $ Face Mask Medium Yes  BiPAP/CPAP/SIPAP Pt Type Adult  BiPAP/CPAP/SIPAP DREAMSTATIOND  Mask Type Nasal mask  Dentures removed? Not applicable  Mask Size Medium  Respiratory Rate 19 breaths/min  EPAP 11 cmH2O (Pt s home settings)  FiO2 (%) 21 %  Patient Home Equipment No  Auto Titrate No  CPAP/SIPAP surface wiped down Yes  BiPAP/CPAP /SiPAP Vitals  Pulse Rate (!) 44  Resp (!) 23  SpO2 94 %  Bilateral Breath Sounds Diminished  MEWS Score/Color  MEWS Score 2  MEWS Score Color Yellow

## 2023-11-08 DIAGNOSIS — J449 Chronic obstructive pulmonary disease, unspecified: Secondary | ICD-10-CM | POA: Diagnosis not present

## 2023-11-08 DIAGNOSIS — R7989 Other specified abnormal findings of blood chemistry: Secondary | ICD-10-CM

## 2023-11-08 DIAGNOSIS — I5021 Acute systolic (congestive) heart failure: Secondary | ICD-10-CM | POA: Diagnosis not present

## 2023-11-08 LAB — COMPREHENSIVE METABOLIC PANEL
ALT: 44 U/L (ref 0–44)
AST: 17 U/L (ref 15–41)
Albumin: 3.4 g/dL — ABNORMAL LOW (ref 3.5–5.0)
Alkaline Phosphatase: 58 U/L (ref 38–126)
Anion gap: 10 (ref 5–15)
BUN: 19 mg/dL (ref 8–23)
CO2: 31 mmol/L (ref 22–32)
Calcium: 8.9 mg/dL (ref 8.9–10.3)
Chloride: 101 mmol/L (ref 98–111)
Creatinine, Ser: 0.8 mg/dL (ref 0.44–1.00)
GFR, Estimated: >60 mL/min (ref >60)
Glucose, Bld: 109 mg/dL — ABNORMAL HIGH (ref 70–99)
Potassium: 3.7 mmol/L (ref 3.5–5.1)
Sodium: 142 mmol/L (ref 135–145)
Total Bilirubin: 1 mg/dL (ref 0.0–1.2)
Total Protein: 6.6 g/dL (ref 6.5–8.1)

## 2023-11-08 LAB — CBC WITH DIFFERENTIAL/PLATELET
Abs Immature Granulocytes: 0.01 10*3/uL (ref 0.00–0.07)
Basophils Absolute: 0 10*3/uL (ref 0.0–0.1)
Basophils Relative: 1 %
Eosinophils Absolute: 0 10*3/uL (ref 0.0–0.5)
Eosinophils Relative: 1 %
HCT: 47.8 % — ABNORMAL HIGH (ref 36.0–46.0)
Hemoglobin: 15.1 g/dL — ABNORMAL HIGH (ref 12.0–15.0)
Immature Granulocytes: 0 %
Lymphocytes Relative: 47 %
Lymphs Abs: 2.1 10*3/uL (ref 0.7–4.0)
MCH: 29.3 pg (ref 26.0–34.0)
MCHC: 31.6 g/dL (ref 30.0–36.0)
MCV: 92.8 fL (ref 80.0–100.0)
Monocytes Absolute: 0.6 10*3/uL (ref 0.1–1.0)
Monocytes Relative: 14 %
Neutro Abs: 1.6 10*3/uL — ABNORMAL LOW (ref 1.7–7.7)
Neutrophils Relative %: 37 %
Platelets: 241 10*3/uL (ref 150–400)
RBC: 5.15 MIL/uL — ABNORMAL HIGH (ref 3.87–5.11)
RDW: 14.6 % (ref 11.5–15.5)
WBC: 4.3 10*3/uL (ref 4.0–10.5)
nRBC: 0 % (ref 0.0–0.2)

## 2023-11-08 LAB — MAGNESIUM: Magnesium: 2 mg/dL (ref 1.7–2.4)

## 2023-11-08 LAB — SURGICAL PCR SCREEN
MRSA, PCR: NEGATIVE
Staphylococcus aureus: NEGATIVE

## 2023-11-08 MED ORDER — SODIUM CHLORIDE 0.9 % IV SOLN: INTRAVENOUS | Status: DC

## 2023-11-08 MED ORDER — CARVEDILOL 3.125 MG PO TABS
3.1250 mg | ORAL_TABLET | Freq: Two times a day (BID) | ORAL | Status: DC
  Administered 2023-11-08 – 2023-11-09 (×2): 3.125 mg via ORAL
  Filled 2023-11-08 (×2): qty 1

## 2023-11-08 NOTE — H&P (View-Only) (Signed)
 Rounding Note    Patient Name: Amber Shaw Date of Encounter: 11/08/2023  [facility] Cardiologist: Rollene Rotunda, MD   Subjective   Denies pain or SOB.  "I feel 100% better."    Inpatient Medications    Scheduled Meds:  aspirin EC  81 mg Oral Daily   atorvastatin  40 mg Oral Daily   enoxaparin (LOVENOX) injection  40 mg Subcutaneous Q24H   fluticasone furoate-vilanterol  1 puff Inhalation Daily   furosemide  40 mg Intravenous BID   pneumococcal 20-valent conjugate vaccine  0.5 mL Intramuscular Tomorrow-1000   sacubitril-valsartan  1 tablet Oral BID   sodium chloride flush  3 mL Intravenous Q12H   Continuous Infusions:  [START ON 11/09/2023] sodium chloride     PRN Meds: acetaminophen **OR** [DISCONTINUED] acetaminophen, albuterol, polyethylene glycol   Vital Signs    Vitals:   11/08/23 0023 11/08/23 0447 11/08/23 0500 11/08/23 0802  BP:  (!) 109/59    Pulse: 76 (!) 55    Resp:  18    Temp:  98.2 F (36.8 C)    TempSrc:  Oral    SpO2:  100%  95%  Weight:   76.6 kg   Height:        Intake/Output Summary (Last 24 hours) at 11/08/2023 0810 Last data filed at 11/07/2023 1019 Gross per 24 hour  Intake 3 ml  Output --  Net 3 ml      11/08/2023    5:00 AM 11/07/2023   10:00 AM 11/06/2023    5:00 AM  Last 3 Weights  Weight (lbs) 168 lb 14 oz 164 lb 1.6 oz 178 lb 9.2 oz  Weight (kg) 76.6 kg 74.435 kg 81 kg      Telemetry    NSR, frequent ventricular ectopy with bigeminy.  - Personally Reviewed  ECG    NSR, rate 82, PVCs with bigeminy - Personally Reviewed  Physical Exam   GEN: No acute distress.   Neck: No JVD Cardiac: RRR, no murmurs, rubs, or gallops.  Respiratory: Clear to auscultation bilaterally. GI: Soft, nontender, non-distended  MS: No edema; No deformity. Neuro:  Nonfocal  Psych: Normal affect   Labs    High Sensitivity Troponin:   Recent Labs  Lab 11/05/23 1805 11/06/23 0820  TROPONINIHS 30* 34*      Chemistry Recent Labs  Lab 11/06/23 0537 11/07/23 0643 11/08/23 0531  NA 139 142 142  K 3.5 3.4* 3.7  CL 99 102 101  CO2 32 30 31  GLUCOSE 100* 94 109*  BUN 9 11 19   CREATININE 0.72 0.69 0.80  CALCIUM 8.7* 8.5* 8.9  MG  --  2.0 2.0  PROT 6.2*  --  6.6  ALBUMIN 3.4*  --  3.4*  AST 42*  --  17  ALT 79*  --  44  ALKPHOS 54  --  58  BILITOT 0.9  --  1.0  GFRNONAA >60 >60 >60  ANIONGAP 8 10 10     Lipids  Recent Labs  Lab 11/06/23 0533  CHOL 154  TRIG 102  HDL 45  LDLCALC 89  CHOLHDL 3.4    Hematology Recent Labs  Lab 11/06/23 0537 11/07/23 0643 11/08/23 0531  WBC 4.5 3.0* 4.3  RBC 4.53 4.74 5.15*  HGB 13.4 14.0 15.1*  HCT 42.1 44.3 47.8*  MCV 92.9 93.5 92.8  MCH 29.6 29.5 29.3  MCHC 31.8 31.6 31.6  RDW 14.9 14.8 14.6  PLT 208 224 241  Thyroid  Recent Labs  Lab 11/07/23 1126  TSH 0.850    BNP Recent Labs  Lab 11/05/23 1334  BNP 1,351.2*    DDimer No results for input(s): "DDIMER" in the last 168 hours.   Radiology    ECHOCARDIOGRAM COMPLETE Result Date: 11/06/2023    ECHOCARDIOGRAM REPORT   Patient Name:   Amber Shaw Date of Exam: 11/06/2023 Medical Rec #:  [id number]     Height:       65.0 in Accession #:    [id number]    Weight:       178.6 lb Date of Birth:  [birth year]     BSA:          1.885 m Patient Age:    63 years      BP:           160/84 mmHg Patient Gender: F             HR:           84 bpm. Exam Location:  Inpatient Procedure: 2D Echo (Both Spectral and Color Flow Doppler were utilized during            procedure). Indications:    acute diastolic chf  History:        Patient has no prior history of Echocardiogram examinations.                 COPD, Signs/Symptoms:Shortness of Breath; Risk Factors:Former                 Smoker and Hypertension.  Sonographer:    Delcie Roch RDCS Referring Phys: [id number] [facility] GOEL IMPRESSIONS  1. Left ventricular ejection fraction, by estimation, is 30%. The left ventricle has moderately decreased  function. The left ventricle demonstrates global hypokinesis. There is mild concentric left ventricular hypertrophy. Left ventricular diastolic parameters are indeterminate.  2. Right ventricular systolic function is moderately reduced. The right ventricular size is normal. There is normal pulmonary artery systolic pressure.  3. Left atrial size was moderately dilated.  4. The mitral valve is normal in structure. Mild mitral valve regurgitation. No evidence of mitral stenosis.  5. The aortic valve is tricuspid. There is mild calcification of the aortic valve. Aortic valve regurgitation is not visualized. Aortic valve sclerosis/calcification is present, without any evidence of aortic stenosis.  6. The inferior vena cava is normal in size with greater than 50% respiratory variability, suggesting right atrial pressure of 3 mmHg. 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 FINDINGS  Left Ventricle: Left ventricular ejection fraction, by estimation, is 30%. The left ventricle has moderately decreased function. The left ventricle demonstrates global hypokinesis. The left ventricular internal cavity size was normal in size. There is mild concentric left ventricular hypertrophy. Left ventricular diastolic parameters are indeterminate. Right Ventricle: The right ventricular size is normal. No increase in right ventricular wall thickness. Right ventricular systolic function is moderately reduced. There is normal pulmonary artery systolic pressure. The tricuspid regurgitant velocity is 2.58 m/s, and with an assumed right atrial pressure of 5 mmHg, the estimated right ventricular systolic pressure is 31.6 mmHg. Left Atrium: Left atrial size was moderately dilated. Right Atrium: Right atrial size was normal in size. Pericardium: There is no evidence of pericardial effusion. Mitral Valve: The mitral valve is normal in structure. Mild mitral valve  regurgitation. No evidence of mitral valve stenosis. Tricuspid Valve: The tricuspid valve is normal in structure. Tricuspid valve regurgitation is mild . No  evidence of tricuspid stenosis. Aortic Valve: The aortic valve is tricuspid. There is mild calcification of the aortic valve. Aortic valve regurgitation is not visualized. Aortic valve sclerosis/calcification is present, without any evidence of aortic stenosis. Pulmonic Valve: The pulmonic valve was normal in structure. Pulmonic valve regurgitation is not visualized. No evidence of pulmonic stenosis. Aorta: The aortic root is normal in size and structure. Venous: The inferior vena cava is normal in size with greater than 50% respiratory variability, suggesting right atrial pressure of 3 mmHg. IAS/Shunts: No atrial level shunt detected by color flow Doppler.  LEFT VENTRICLE PLAX 2D LVIDd:         5.30 cm      Diastology LVIDs:         4.60 cm      LV e' medial:  5.77 cm/s LV PW:         1.10 cm      LV e' lateral: 8.59 cm/s LV IVS:        1.00 cm LVOT diam:     2.00 cm LVOT Area:     3.14 cm  LV Volumes (MOD) LV vol d, MOD A4C: 116.0 ml LV vol s, MOD A4C: 77.0 ml LV SV MOD A4C:     116.0 ml RIGHT VENTRICLE          IVC RV Basal diam:  2.60 cm  IVC diam: 1.50 cm TAPSE (M-mode): 2.0 cm LEFT ATRIUM             Index        RIGHT ATRIUM           Index LA diam:        3.80 cm 2.02 cm/m   RA Area:     15.50 cm LA Vol (A2C):   92.8 ml 49.23 ml/m  RA Volume:   36.00 ml  19.10 ml/m LA Vol (A4C):   67.1 ml 35.59 ml/m LA Biplane Vol: 80.6 ml 42.76 ml/m   AORTA Ao Root diam: 2.90 cm Ao Asc diam:  3.00 cm TRICUSPID VALVE TR Peak grad:   26.6 mmHg TR Vmax:        258.00 cm/s  SHUNTS Systemic Diam: 2.00 cm Arvilla Meres MD Electronically signed by Arvilla Meres MD Signature Date/Time: 11/06/2023/11:37:09 AM    Final     Cardiac Studies   Cath pending Echo above.   Patient Profile     63 y.o. female without prior cardiac history who presents with edema and  SOB and is found to have reduced EF as above.   Assessment & Plan    Acute systolic HF:    Right and left heart cath tomorrow.  On add on board.  Switched to [employer] yesterday.  On Lasix IV.   Intake and output incomplete.    Creat is stable.     BP is slightly labile.  OK to start Coreg.  Other med titration to continue prior to discharge and afterward.     MR:   Follow clinically.   VENTRICULAR BIGEMINY:  Start low dose beta blocker.  HTN:  This is being managed in the context of treating his CHF   For questions or updates, please contact [facility] Please consult [email or web address] for contact info under        Signed, Rollene Rotunda, MD  11/08/2023, 8:10 AM

## 2023-11-08 NOTE — Progress Notes (Signed)
   11/08/23 2316  BiPAP/CPAP/SIPAP  Reason BIPAP/CPAP not in use Other(comment) (pt unable to wear because of mask)  BiPAP/CPAP /SiPAP Vitals  Resp 20  MEWS Score/Color  MEWS Score 1  MEWS Score Color Green

## 2023-11-08 NOTE — Plan of Care (Signed)
  Problem: Safety: Goal: Ability to remain free from injury will improve Outcome: Progressing   Problem: Pain Managment: Goal: General experience of comfort will improve and/or be controlled Outcome: Progressing   Problem: Nutrition: Goal: Adequate nutrition will be maintained Outcome: Progressing

## 2023-11-08 NOTE — Progress Notes (Signed)
 Progress Note    Amber Shaw   YQM:[id number]  DOB: Feb 08, 1961  DOA: 11/05/2023     3 PCP: Patient, [name]  Initial CC: dyspnea   Hospital Course: Amber Shaw is a 63 yo female with PMH COPD, sleep apnea, HTN, former tobacco use, arthritis who presented with worsening dyspnea. This became a problem over the past couple weeks and typically she does not become short of breath with walking.  She also endorsed over the past week that she is now using 3 pillows to sleep instead of her prior 2 pillows and typically would sleep all 8 hours during the night, but recently is waking up in the middle of the night short of breath as well.  She does wear her CPAP mask at home.  Recently she has also been exposed to secondhand smoke from her sister for the past 3 weeks who is visiting.  Patient has quit smoking for the last 3 years.  She endorses heart disease in her family.  Her sister passed away at 93 years old and also heart disease in her dad and maternal aunt.  CXR on workup showed cardiomegaly with mild pulmonary edema.  BNP also elevated 1,351. Troponin trend was flat (30>>34).  She received a dose of Lasix in the ER with good diuresis and improvement in her symptoms.  EKG reviewed does show a history of frequent PVCs and again seen on EKG on admission.  Echo obtained after admission showing reduced EF, 30%, global hypokinesis.  Interval History:  Some hypotension and bradycardia noted this morning.  Patient asymptomatic.  Still having PVCs.  However, she states her breathing is completely back to normal with no further dyspnea.  Assessment and Plan: * Acute systolic (congestive) heart failure (HCC) - PND, orthopnea, cardiomegaly, pulmonary edema, LE edema, DOE/SOB - BNP 1,351 - FH of heart disease with early death in sister (age 62 yo) - echo noted with EF 30%, indeterminate diastology, global hypokinesis, RV systolic function moderately reduced - echo also notes frequent PVC  burden - seems to be improved after lasix in the ER with minimal LE edema and improvement in dyspnea  - continue lasix as per cardiology - appreciate cardiology evaluation; further GDMT being slowly initiated - plans for left/right heart cath on Monday  - A1c 5.8% and LDL 89.  TSH normal, 0.850 - continue asa and lipitor   Elevated LFTs - suspect some congestive hepatopathy  -Normalized with diuresis.  No further trending needed  COPD GOLD III - some possible exacerbation but not severe with the 2nd hand smoke recently in the house, but workup more consistent with CHF -Significant improvement with diuresis -Continue albuterol as needed -Continue Breo Ellipta  Essential hypertension, benign - suspect further modification of meds   Old records reviewed in assessment of this patient  Antimicrobials:   DVT prophylaxis:  enoxaparin (LOVENOX) injection 40 mg Start: 11/06/23 0800 SCDs Start: 11/05/23 1712   Code Status:   Code Status: Full Code  Mobility Assessment (Last 72 Hours)     Mobility Assessment     Row Name 11/07/23 2053 11/07/23 0945 11/06/23 2100 11/06/23 0909 11/06/23 0900   Does patient have an order for bedrest or is patient medically unstable No - Continue assessment No - Continue assessment No - Continue assessment No - Continue assessment No - Continue assessment   What is the highest level of mobility based on the progressive mobility assessment? Level 6 (Walks independently in room and hall) - Balance while  walking in room without assist - Complete Level 6 (Walks independently in room and hall) - Balance while walking in room without assist - Complete Level 6 (Walks independently in room and hall) - Balance while walking in room without assist - Complete Level 6 (Walks independently in room and hall) - Balance while walking in room without assist - Complete Level 6 (Walks independently in room and hall) - Balance while walking in room without assist - Complete     Row Name 11/05/23 1945 11/05/23 1824         Does patient have an order for bedrest or is patient medically unstable No - Continue assessment No - Continue assessment      What is the highest level of mobility based on the progressive mobility assessment? Level 6 (Walks independently in room and hall) - Balance while walking in room without assist - Complete Level 6 (Walks independently in room and hall) - Balance while walking in room without assist - Complete               Barriers to discharge: None Disposition Plan: Home HH orders placed: N/A Status is: Inpatient  Objective: Blood pressure (!) 94/50, pulse (!) 48, temperature 98.6 F (37 C), temperature source Oral, resp. rate 19, height 5\' 5"  (1.651 m), weight 76.6 kg, last menstrual period 05/16/2013, SpO2 100%.  Examination:  Physical Exam Constitutional:      Appearance: Normal appearance.  HENT:     Head: Normocephalic and atraumatic.     Mouth/Throat:     Mouth: Mucous membranes are moist.  Eyes:     Extraocular Movements: Extraocular movements intact.  Cardiovascular:     Rate and Rhythm: Normal rate. Rhythm irregular.  Pulmonary:     Effort: Pulmonary effort is normal. No respiratory distress.     Breath sounds: Rales present. No wheezing.  Abdominal:     General: Bowel sounds are normal. There is no distension.     Palpations: Abdomen is soft.     Tenderness: There is no abdominal tenderness.  Musculoskeletal:        General: Normal range of motion.     Cervical back: Normal range of motion and neck supple.     Right lower leg: Edema (1+, improved) present.     Left lower leg: Edema (1+, improved) present.  Skin:    General: Skin is warm and dry.  Neurological:     General: No focal deficit present.     Mental Status: She is alert.  Psychiatric:        Mood and Affect: Mood normal.      Consultants:  Cardiology  Procedures:    Data Reviewed: Results for orders placed or performed during  the hospital encounter of 11/05/23 (from the past 24 hours)  CBC with Differential/Platelet     Status: Abnormal   Collection Time: 11/08/23  5:31 AM  Result Value Ref Range   WBC 4.3 4.0 - 10.5 K/uL   RBC 5.15 (H) 3.87 - 5.11 MIL/uL   Hemoglobin 15.1 (H) 12.0 - 15.0 g/dL   HCT 91.4 (H) 78.2 - 95.6 %   MCV 92.8 80.0 - 100.0 fL   MCH 29.3 26.0 - 34.0 pg   MCHC 31.6 30.0 - 36.0 g/dL   RDW 21.3 08.6 - 57.8 %   Platelets 241 150 - 400 K/uL   nRBC 0.0 0.0 - 0.2 %   Neutrophils Relative % 37 %   Neutro Abs 1.6 (L) 1.7 -  7.7 K/uL   Lymphocytes Relative 47 %   Lymphs Abs 2.1 0.7 - 4.0 K/uL   Monocytes Relative 14 %   Monocytes Absolute 0.6 0.1 - 1.0 K/uL   Eosinophils Relative 1 %   Eosinophils Absolute 0.0 0.0 - 0.5 K/uL   Basophils Relative 1 %   Basophils Absolute 0.0 0.0 - 0.1 K/uL   Immature Granulocytes 0 %   Abs Immature Granulocytes 0.01 0.00 - 0.07 K/uL  Magnesium     Status: None   Collection Time: 11/08/23  5:31 AM  Result Value Ref Range   Magnesium 2.0 1.7 - 2.4 mg/dL  Comprehensive metabolic panel     Status: Abnormal   Collection Time: 11/08/23  5:31 AM  Result Value Ref Range   Sodium 142 135 - 145 mmol/L   Potassium 3.7 3.5 - 5.1 mmol/L   Chloride 101 98 - 111 mmol/L   CO2 31 22 - 32 mmol/L   Glucose, Bld 109 (H) 70 - 99 mg/dL   BUN 19 8 - 23 mg/dL   Creatinine, Ser 4.09 0.44 - 1.00 mg/dL   Calcium 8.9 8.9 - 81.1 mg/dL   Total Protein 6.6 6.5 - 8.1 g/dL   Albumin 3.4 (L) 3.5 - 5.0 g/dL   AST 17 15 - 41 U/L   ALT 44 0 - 44 U/L   Alkaline Phosphatase 58 38 - 126 U/L   Total Bilirubin 1.0 0.0 - 1.2 mg/dL   GFR, Estimated >91 >47 mL/min   Anion gap 10 5 - 15    I have reviewed pertinent nursing notes, vitals, labs, and images as necessary. I have ordered labwork to follow up on as indicated.  I have reviewed the last notes from staff over past 24 hours. I have discussed patient's care plan and test results with nursing staff, CM/SW, and other staff as  appropriate.  Time spent: Greater than 50% of the 55 minute visit was spent in counseling/coordination of care for the patient as laid out in the A&P.   LOS: 3 days   Lewie Chamber, MD [facility] 11/08/2023, 11:41 AM

## 2023-11-08 NOTE — Progress Notes (Signed)
 Rounding Note    Patient Name: Jessly Lebeck Date of Encounter: 11/08/2023  [facility] Cardiologist: Rollene Rotunda, MD   Subjective   Denies pain or SOB.  "I feel 100% better."    Inpatient Medications    Scheduled Meds:  aspirin EC  81 mg Oral Daily   atorvastatin  40 mg Oral Daily   enoxaparin (LOVENOX) injection  40 mg Subcutaneous Q24H   fluticasone furoate-vilanterol  1 puff Inhalation Daily   furosemide  40 mg Intravenous BID   pneumococcal 20-valent conjugate vaccine  0.5 mL Intramuscular Tomorrow-1000   sacubitril-valsartan  1 tablet Oral BID   sodium chloride flush  3 mL Intravenous Q12H   Continuous Infusions:  [START ON 11/09/2023] sodium chloride     PRN Meds: acetaminophen **OR** [DISCONTINUED] acetaminophen, albuterol, polyethylene glycol   Vital Signs    Vitals:   11/08/23 0023 11/08/23 0447 11/08/23 0500 11/08/23 0802  BP:  (!) 109/59    Pulse: 76 (!) 55    Resp:  18    Temp:  98.2 F (36.8 C)    TempSrc:  Oral    SpO2:  100%  95%  Weight:   76.6 kg   Height:        Intake/Output Summary (Last 24 hours) at 11/08/2023 0810 Last data filed at 11/07/2023 1019 Gross per 24 hour  Intake 3 ml  Output --  Net 3 ml      11/08/2023    5:00 AM 11/07/2023   10:00 AM 11/06/2023    5:00 AM  Last 3 Weights  Weight (lbs) 168 lb 14 oz 164 lb 1.6 oz 178 lb 9.2 oz  Weight (kg) 76.6 kg 74.435 kg 81 kg      Telemetry    NSR, frequent ventricular ectopy with bigeminy.  - Personally Reviewed  ECG    NSR, rate 82, PVCs with bigeminy - Personally Reviewed  Physical Exam   GEN: No acute distress.   Neck: No JVD Cardiac: RRR, no murmurs, rubs, or gallops.  Respiratory: Clear to auscultation bilaterally. GI: Soft, nontender, non-distended  MS: No edema; No deformity. Neuro:  Nonfocal  Psych: Normal affect   Labs    High Sensitivity Troponin:   Recent Labs  Lab 11/05/23 1805 11/06/23 0820  TROPONINIHS 30* 34*      Chemistry Recent Labs  Lab 11/06/23 0537 11/07/23 0643 11/08/23 0531  NA 139 142 142  K 3.5 3.4* 3.7  CL 99 102 101  CO2 32 30 31  GLUCOSE 100* 94 109*  BUN 9 11 19   CREATININE 0.72 0.69 0.80  CALCIUM 8.7* 8.5* 8.9  MG  --  2.0 2.0  PROT 6.2*  --  6.6  ALBUMIN 3.4*  --  3.4*  AST 42*  --  17  ALT 79*  --  44  ALKPHOS 54  --  58  BILITOT 0.9  --  1.0  GFRNONAA >60 >60 >60  ANIONGAP 8 10 10     Lipids  Recent Labs  Lab 11/06/23 0533  CHOL 154  TRIG 102  HDL 45  LDLCALC 89  CHOLHDL 3.4    Hematology Recent Labs  Lab 11/06/23 0537 11/07/23 0643 11/08/23 0531  WBC 4.5 3.0* 4.3  RBC 4.53 4.74 5.15*  HGB 13.4 14.0 15.1*  HCT 42.1 44.3 47.8*  MCV 92.9 93.5 92.8  MCH 29.6 29.5 29.3  MCHC 31.8 31.6 31.6  RDW 14.9 14.8 14.6  PLT 208 224 241  Thyroid  Recent Labs  Lab 11/07/23 1126  TSH 0.850    BNP Recent Labs  Lab 11/05/23 1334  BNP 1,351.2*    DDimer No results for input(s): "DDIMER" in the last 168 hours.   Radiology    ECHOCARDIOGRAM COMPLETE Result Date: 11/06/2023    ECHOCARDIOGRAM REPORT   Patient Name:   SYMANTHA STEEBER Date of Exam: 11/06/2023 Medical Rec #:  [id number]     Height:       65.0 in Accession #:    [id number]    Weight:       178.6 lb Date of Birth:  [birth year]     BSA:          1.885 m Patient Age:    63 years      BP:           160/84 mmHg Patient Gender: F             HR:           84 bpm. Exam Location:  Inpatient Procedure: 2D Echo (Both Spectral and Color Flow Doppler were utilized during            procedure). Indications:    acute diastolic chf  History:        Patient has no prior history of Echocardiogram examinations.                 COPD, Signs/Symptoms:Shortness of Breath; Risk Factors:Former                 Smoker and Hypertension.  Sonographer:    Delcie Roch RDCS Referring Phys: [id number] [facility] GOEL IMPRESSIONS  1. Left ventricular ejection fraction, by estimation, is 30%. The left ventricle has moderately decreased  function. The left ventricle demonstrates global hypokinesis. There is mild concentric left ventricular hypertrophy. Left ventricular diastolic parameters are indeterminate.  2. Right ventricular systolic function is moderately reduced. The right ventricular size is normal. There is normal pulmonary artery systolic pressure.  3. Left atrial size was moderately dilated.  4. The mitral valve is normal in structure. Mild mitral valve regurgitation. No evidence of mitral stenosis.  5. The aortic valve is tricuspid. There is mild calcification of the aortic valve. Aortic valve regurgitation is not visualized. Aortic valve sclerosis/calcification is present, without any evidence of aortic stenosis.  6. The inferior vena cava is normal in size with greater than 50% respiratory variability, suggesting right atrial pressure of 3 mmHg. 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 FINDINGS  Left Ventricle: Left ventricular ejection fraction, by estimation, is 30%. The left ventricle has moderately decreased function. The left ventricle demonstrates global hypokinesis. The left ventricular internal cavity size was normal in size. There is mild concentric left ventricular hypertrophy. Left ventricular diastolic parameters are indeterminate. Right Ventricle: The right ventricular size is normal. No increase in right ventricular wall thickness. Right ventricular systolic function is moderately reduced. There is normal pulmonary artery systolic pressure. The tricuspid regurgitant velocity is 2.58 m/s, and with an assumed right atrial pressure of 5 mmHg, the estimated right ventricular systolic pressure is 31.6 mmHg. Left Atrium: Left atrial size was moderately dilated. Right Atrium: Right atrial size was normal in size. Pericardium: There is no evidence of pericardial effusion. Mitral Valve: The mitral valve is normal in structure. Mild mitral valve  regurgitation. No evidence of mitral valve stenosis. Tricuspid Valve: The tricuspid valve is normal in structure. Tricuspid valve regurgitation is mild . No  evidence of tricuspid stenosis. Aortic Valve: The aortic valve is tricuspid. There is mild calcification of the aortic valve. Aortic valve regurgitation is not visualized. Aortic valve sclerosis/calcification is present, without any evidence of aortic stenosis. Pulmonic Valve: The pulmonic valve was normal in structure. Pulmonic valve regurgitation is not visualized. No evidence of pulmonic stenosis. Aorta: The aortic root is normal in size and structure. Venous: The inferior vena cava is normal in size with greater than 50% respiratory variability, suggesting right atrial pressure of 3 mmHg. IAS/Shunts: No atrial level shunt detected by color flow Doppler.  LEFT VENTRICLE PLAX 2D LVIDd:         5.30 cm      Diastology LVIDs:         4.60 cm      LV e' medial:  5.77 cm/s LV PW:         1.10 cm      LV e' lateral: 8.59 cm/s LV IVS:        1.00 cm LVOT diam:     2.00 cm LVOT Area:     3.14 cm  LV Volumes (MOD) LV vol d, MOD A4C: 116.0 ml LV vol s, MOD A4C: 77.0 ml LV SV MOD A4C:     116.0 ml RIGHT VENTRICLE          IVC RV Basal diam:  2.60 cm  IVC diam: 1.50 cm TAPSE (M-mode): 2.0 cm LEFT ATRIUM             Index        RIGHT ATRIUM           Index LA diam:        3.80 cm 2.02 cm/m   RA Area:     15.50 cm LA Vol (A2C):   92.8 ml 49.23 ml/m  RA Volume:   36.00 ml  19.10 ml/m LA Vol (A4C):   67.1 ml 35.59 ml/m LA Biplane Vol: 80.6 ml 42.76 ml/m   AORTA Ao Root diam: 2.90 cm Ao Asc diam:  3.00 cm TRICUSPID VALVE TR Peak grad:   26.6 mmHg TR Vmax:        258.00 cm/s  SHUNTS Systemic Diam: 2.00 cm Arvilla Meres MD Electronically signed by Arvilla Meres MD Signature Date/Time: 11/06/2023/11:37:09 AM    Final     Cardiac Studies   Cath pending Echo above.   Patient Profile     63 y.o. female without prior cardiac history who presents with edema and  SOB and is found to have reduced EF as above.   Assessment & Plan    Acute systolic HF:    Right and left heart cath tomorrow.  On add on board.  Switched to [employer] yesterday.  On Lasix IV.   Intake and output incomplete.    Creat is stable.     BP is slightly labile.  OK to start Coreg.  Other med titration to continue prior to discharge and afterward.     MR:   Follow clinically.   VENTRICULAR BIGEMINY:  Start low dose beta blocker.  HTN:  This is being managed in the context of treating his CHF   For questions or updates, please contact [facility] Please consult [email or web address] for contact info under        Signed, Rollene Rotunda, MD  11/08/2023, 8:10 AM

## 2023-11-08 NOTE — Plan of Care (Signed)
  Problem: Education: Goal: Knowledge of General Education information will improve Description: Including pain rating scale, medication(s)/side effects and non-pharmacologic comfort measures Outcome: Progressing   Problem: Health Behavior/Discharge Planning: Goal: Ability to manage health-related needs will improve Outcome: Progressing   Problem: Clinical Measurements: Goal: Ability to maintain clinical measurements within normal limits will improve Outcome: Progressing Goal: Will remain free from infection Outcome: Progressing Goal: Diagnostic test results will improve Outcome: Progressing Goal: Cardiovascular complication will be avoided Outcome: Progressing   Problem: Activity: Goal: Risk for activity intolerance will decrease Outcome: Progressing   Problem: Nutrition: Goal: Adequate nutrition will be maintained Outcome: Progressing   Problem: Coping: Goal: Level of anxiety will decrease Outcome: Progressing   Problem: Elimination: Goal: Will not experience complications related to bowel motility Outcome: Progressing Goal: Will not experience complications related to urinary retention Outcome: Progressing   Problem: Pain Managment: Goal: General experience of comfort will improve and/or be controlled Outcome: Progressing   Problem: Safety: Goal: Ability to remain free from injury will improve Outcome: Progressing   Problem: Skin Integrity: Goal: Risk for impaired skin integrity will decrease Outcome: Progressing   Problem: Clinical Measurements: Goal: Respiratory complications will improve Outcome: Not Progressing

## 2023-11-09 ENCOUNTER — Encounter (HOSPITAL_COMMUNITY): Payer: Self-pay | Admitting: Internal Medicine

## 2023-11-09 ENCOUNTER — Encounter (HOSPITAL_COMMUNITY)
Admission: EM | Disposition: A | Discharge status: Home / Self Care | Payer: Self-pay | Source: Home / Self Care | Attending: Internal Medicine

## 2023-11-09 DIAGNOSIS — I5021 Acute systolic (congestive) heart failure: Secondary | ICD-10-CM | POA: Diagnosis not present

## 2023-11-09 HISTORY — PX: RIGHT/LEFT HEART CATH AND CORONARY ANGIOGRAPHY: CATH118266

## 2023-11-09 LAB — BASIC METABOLIC PANEL
Anion gap: 8 (ref 5–15)
Anion gap: 10 (ref 5–15)
BUN: 22 mg/dL (ref 8–23)
BUN: 18 mg/dL (ref 8–23)
CO2: 32 mmol/L (ref 22–32)
CO2: 31 mmol/L (ref 22–32)
Calcium: 8.7 mg/dL — ABNORMAL LOW (ref 8.9–10.3)
Calcium: 9.1 mg/dL (ref 8.9–10.3)
Chloride: 102 mmol/L (ref 98–111)
Chloride: 98 mmol/L (ref 98–111)
Creatinine, Ser: 0.82 mg/dL (ref 0.44–1.00)
Creatinine, Ser: 0.73 mg/dL (ref 0.44–1.00)
GFR, Estimated: >60 mL/min (ref >60)
GFR, Estimated: >60 mL/min (ref >60)
Glucose, Bld: 96 mg/dL (ref 70–99)
Glucose, Bld: 118 mg/dL — ABNORMAL HIGH (ref 70–99)
Potassium: 4 mmol/L (ref 3.5–5.1)
Potassium: 3.6 mmol/L (ref 3.5–5.1)
Sodium: 142 mmol/L (ref 135–145)
Sodium: 139 mmol/L (ref 135–145)

## 2023-11-09 LAB — CBC
HCT: 55.7 % — ABNORMAL HIGH (ref 36.0–46.0)
Hemoglobin: 17.3 g/dL — ABNORMAL HIGH (ref 12.0–15.0)
MCH: 29 pg (ref 26.0–34.0)
MCHC: 31.1 g/dL (ref 30.0–36.0)
MCV: 93.3 fL (ref 80.0–100.0)
Platelets: 257 10*3/uL (ref 150–400)
RBC: 5.97 MIL/uL — ABNORMAL HIGH (ref 3.87–5.11)
RDW: 14.6 % (ref 11.5–15.5)
WBC: 3.9 10*3/uL — ABNORMAL LOW (ref 4.0–10.5)
nRBC: 0 % (ref 0.0–0.2)

## 2023-11-09 LAB — POCT I-STAT 7, (LYTES, BLD GAS, ICA,H+H)
Acid-Base Excess: 7 mmol/L — ABNORMAL HIGH (ref 0.0–2.0)
Bicarbonate: 32.6 mmol/L — ABNORMAL HIGH (ref 20.0–28.0)
Calcium, Ion: 1.12 mmol/L — ABNORMAL LOW (ref 1.15–1.40)
HCT: 46 % (ref 36.0–46.0)
Hemoglobin: 15.6 g/dL — ABNORMAL HIGH (ref 12.0–15.0)
O2 Saturation: 94 %
Potassium: 3.5 mmol/L (ref 3.5–5.1)
Sodium: 142 mmol/L (ref 135–145)
TCO2: 34 mmol/L — ABNORMAL HIGH (ref 22–32)
pCO2 arterial: 46.3 mmHg (ref 32–48)
pH, Arterial: 7.456 — ABNORMAL HIGH (ref 7.35–7.45)
pO2, Arterial: 67 mmHg — ABNORMAL LOW (ref 83–108)

## 2023-11-09 LAB — POCT I-STAT EG7
Acid-Base Excess: 7 mmol/L — ABNORMAL HIGH (ref 0.0–2.0)
Bicarbonate: 33.5 mmol/L — ABNORMAL HIGH (ref 20.0–28.0)
Calcium, Ion: 1.15 mmol/L (ref 1.15–1.40)
HCT: 47 % — ABNORMAL HIGH (ref 36.0–46.0)
Hemoglobin: 16 g/dL — ABNORMAL HIGH (ref 12.0–15.0)
O2 Saturation: 66 %
Potassium: 3.4 mmol/L — ABNORMAL LOW (ref 3.5–5.1)
Sodium: 142 mmol/L (ref 135–145)
TCO2: 35 mmol/L — ABNORMAL HIGH (ref 22–32)
pCO2, Ven: 53.1 mmHg (ref 44–60)
pH, Ven: 7.408 (ref 7.25–7.43)
pO2, Ven: 35 mmHg (ref 32–45)

## 2023-11-09 LAB — CBC WITH DIFFERENTIAL/PLATELET
Abs Immature Granulocytes: 0 10*3/uL (ref 0.00–0.07)
Basophils Absolute: 0 10*3/uL (ref 0.0–0.1)
Basophils Relative: 1 %
Eosinophils Absolute: 0 10*3/uL (ref 0.0–0.5)
Eosinophils Relative: 1 %
HCT: 49.5 % — ABNORMAL HIGH (ref 36.0–46.0)
Hemoglobin: 15.5 g/dL — ABNORMAL HIGH (ref 12.0–15.0)
Immature Granulocytes: 0 %
Lymphocytes Relative: 51 %
Lymphs Abs: 2.2 10*3/uL (ref 0.7–4.0)
MCH: 29.2 pg (ref 26.0–34.0)
MCHC: 31.3 g/dL (ref 30.0–36.0)
MCV: 93.4 fL (ref 80.0–100.0)
Monocytes Absolute: 0.6 10*3/uL (ref 0.1–1.0)
Monocytes Relative: 14 %
Neutro Abs: 1.4 10*3/uL — ABNORMAL LOW (ref 1.7–7.7)
Neutrophils Relative %: 33 %
Platelets: 242 10*3/uL (ref 150–400)
RBC: 5.3 MIL/uL — ABNORMAL HIGH (ref 3.87–5.11)
RDW: 14.7 % (ref 11.5–15.5)
WBC: 4.2 10*3/uL (ref 4.0–10.5)
nRBC: 0 % (ref 0.0–0.2)

## 2023-11-09 LAB — LIPOPROTEIN A (LPA): Lipoprotein (a): 161.8 nmol/L — ABNORMAL HIGH (ref <75.0)

## 2023-11-09 LAB — MAGNESIUM: Magnesium: 2.1 mg/dL (ref 1.7–2.4)

## 2023-11-09 SURGERY — RIGHT/LEFT HEART CATH AND CORONARY ANGIOGRAPHY
Anesthesia: LOCAL

## 2023-11-09 MED ORDER — HEPARIN (PORCINE) IN NACL 1000-0.9 UT/500ML-% IV SOLN
INTRAVENOUS | Status: DC | PRN
  Administered 2023-11-09 (×2): 500 mL

## 2023-11-09 MED ORDER — FENTANYL CITRATE (PF) 100 MCG/2ML IJ SOLN
INTRAMUSCULAR | Status: AC
  Filled 2023-11-09: qty 2

## 2023-11-09 MED ORDER — MIDAZOLAM HCL 2 MG/2ML IJ SOLN
INTRAMUSCULAR | Status: DC | PRN
  Administered 2023-11-09: 1 mg via INTRAVENOUS

## 2023-11-09 MED ORDER — ASPIRIN 81 MG PO CHEW
81.0000 mg | CHEWABLE_TABLET | ORAL | Status: DC
Start: 2023-11-10 — End: 2023-11-09

## 2023-11-09 MED ORDER — MIDAZOLAM HCL 2 MG/2ML IJ SOLN
INTRAMUSCULAR | Status: AC
  Filled 2023-11-09: qty 2

## 2023-11-09 MED ORDER — IOHEXOL 350 MG/ML SOLN
INTRAVENOUS | Status: DC | PRN
  Administered 2023-11-09: 26 mL

## 2023-11-09 MED ORDER — HEPARIN SODIUM (PORCINE) 1000 UNIT/ML IJ SOLN
INTRAMUSCULAR | Status: AC
  Filled 2023-11-09: qty 10

## 2023-11-09 MED ORDER — SODIUM CHLORIDE 0.9% FLUSH
3.0000 mL | Freq: Two times a day (BID) | INTRAVENOUS | Status: DC
  Administered 2023-11-10: 3 mL via INTRAVENOUS

## 2023-11-09 MED ORDER — SODIUM CHLORIDE 0.9% FLUSH: 3.0000 mL | INTRAVENOUS | Status: DC | PRN

## 2023-11-09 MED ORDER — SODIUM CHLORIDE 0.9 % IV SOLN: 250.0000 mL | INTRAVENOUS | Status: DC | PRN

## 2023-11-09 MED ORDER — LIDOCAINE HCL (PF) 1 % IJ SOLN
INTRAMUSCULAR | Status: AC
  Filled 2023-11-09: qty 30

## 2023-11-09 MED ORDER — ALUM & MAG HYDROXIDE-SIMETH 200-200-20 MG/5ML PO SUSP
30.0000 mL | Freq: Four times a day (QID) | ORAL | Status: DC | PRN
  Administered 2023-11-09: 30 mL via ORAL
  Filled 2023-11-09: qty 30

## 2023-11-09 MED ORDER — HEPARIN SODIUM (PORCINE) 1000 UNIT/ML IJ SOLN
INTRAMUSCULAR | Status: DC | PRN
  Administered 2023-11-09: 4000 [IU] via INTRAVENOUS

## 2023-11-09 MED ORDER — VERAPAMIL HCL 2.5 MG/ML IV SOLN
INTRAVENOUS | Status: DC | PRN
  Administered 2023-11-09: 10 mL via INTRA_ARTERIAL

## 2023-11-09 MED ORDER — CARVEDILOL 6.25 MG PO TABS
6.2500 mg | ORAL_TABLET | Freq: Two times a day (BID) | ORAL | Status: DC
  Administered 2023-11-09: 6.25 mg via ORAL
  Filled 2023-11-09: qty 1

## 2023-11-09 MED ORDER — ACETAMINOPHEN 325 MG PO TABS
ORAL_TABLET | ORAL | Status: AC
  Filled 2023-11-09: qty 2

## 2023-11-09 MED ORDER — ENOXAPARIN SODIUM 40 MG/0.4ML IJ SOSY
40.0000 mg | PREFILLED_SYRINGE | INTRAMUSCULAR | Status: DC
  Administered 2023-11-10: 40 mg via SUBCUTANEOUS
  Filled 2023-11-09: qty 0.4

## 2023-11-09 MED ORDER — ASPIRIN 81 MG PO CHEW
81.0000 mg | CHEWABLE_TABLET | ORAL | Status: AC
  Administered 2023-11-09: 81 mg via ORAL

## 2023-11-09 MED ORDER — FENTANYL CITRATE (PF) 100 MCG/2ML IJ SOLN
INTRAMUSCULAR | Status: DC | PRN
  Administered 2023-11-09: 25 ug via INTRAVENOUS

## 2023-11-09 MED ORDER — LIDOCAINE HCL (PF) 1 % IJ SOLN
INTRAMUSCULAR | Status: DC | PRN
  Administered 2023-11-09 (×2): 2 mL

## 2023-11-09 MED ORDER — VERAPAMIL HCL 2.5 MG/ML IV SOLN
INTRAVENOUS | Status: AC
  Filled 2023-11-09: qty 2

## 2023-11-09 MED ORDER — HYDRALAZINE HCL 20 MG/ML IJ SOLN: 10.0000 mg | INTRAMUSCULAR | Status: AC | PRN

## 2023-11-09 SURGICAL SUPPLY — 13 items
CATH 5FR JL3.5 JR4 ANG PIG MP (CATHETERS) IMPLANT
CATH BALLN WEDGE 5F 110CM (CATHETERS) IMPLANT
CATH INFINITI 5FR JL4 (CATHETERS) IMPLANT
DEVICE RAD COMP TR BAND LRG (VASCULAR PRODUCTS) IMPLANT
ELECT DEFIB PAD ADLT CADENCE (PAD) IMPLANT
GLIDESHEATH SLEND SS 6F .021 (SHEATH) IMPLANT
GUIDEWIRE .025 260CM (WIRE) IMPLANT
GUIDEWIRE INQWIRE 1.5J.035X260 (WIRE) IMPLANT
INQWIRE 1.5J .035X260CM (WIRE) ×1 IMPLANT
PACK CARDIAC CATHETERIZATION (CUSTOM PROCEDURE TRAY) ×2 IMPLANT
SET ATX-X65L (MISCELLANEOUS) IMPLANT
SHEATH GLIDE SLENDER 4/5FR (SHEATH) IMPLANT
SHEATH PROBE COVER 6X72 (BAG) IMPLANT

## 2023-11-09 NOTE — Plan of Care (Signed)

## 2023-11-09 NOTE — Progress Notes (Addendum)
 LOCATION: right RADIAL  DEFLATED PER PROTOCOL: YES  TIME BAND OFF/DRESSING APPLIED: 1630, gauze with tegaderm placed  SITE UPON ARRIVAL: LEVEL 0  SITE AFTER BAND REMOVAL: LEVEL 0  CIRCULATION SENSATION AND MOVEMENT: +2 radial pulse, + movement noted  COMMENTS:

## 2023-11-09 NOTE — Progress Notes (Signed)
 Pt arrived to [facility] 7 from [facility] via [facility] transport at 1230. Consent signed prior to arrival. 1 PIV in place on L arm, flushes well and saline locked. Unable to place second PIV prior to procedure start; lab technicians notified. Confirmed ASA 81mg  given prior to arrival. VSS and no chest pain reported. Ventricular bigeminy and PJCs noted on monitor.

## 2023-11-09 NOTE — Plan of Care (Signed)

## 2023-11-09 NOTE — Plan of Care (Signed)

## 2023-11-09 NOTE — Progress Notes (Signed)
   11/09/23 2259  BiPAP/CPAP/SIPAP  BiPAP/CPAP/SIPAP Pt Type Adult  Reason BIPAP/CPAP not in use Non-compliant (Pt stated she doesn't want to wear cpap tonight)

## 2023-11-09 NOTE — Progress Notes (Signed)
 Progress Note    Amber Shaw   WUJ:[id number]  DOB: 01/05/1961  DOA: 11/05/2023     4 PCP: Patient, [name]  Initial CC: dyspnea   Hospital Course: Amber Shaw is a 63 yo female with PMH COPD, sleep apnea, HTN, former tobacco use, arthritis who presented with worsening dyspnea. This became a problem over the past couple weeks and typically she does not become short of breath with walking.  She also endorsed over the past week that she is now using 3 pillows to sleep instead of her prior 2 pillows and typically would sleep all 8 hours during the night, but recently is waking up in the middle of the night short of breath as well.  She does wear her CPAP mask at home.  Recently she has also been exposed to secondhand smoke from her sister for the past 3 weeks who is visiting.  Patient has quit smoking for the last 3 years.  She endorses heart disease in her family.  Her sister passed away at 22 years old and also heart disease in her dad and maternal aunt.  CXR on workup showed cardiomegaly with mild pulmonary edema.  BNP also elevated 1,351. Troponin trend was flat (30>>34).  She received a dose of Lasix in the ER with good diuresis and improvement in her symptoms.  EKG reviewed does show a history of frequent PVCs and again seen on EKG on admission.  Echo obtained after admission showing reduced EF, 30%, global hypokinesis. She underwent cardiology evaluation and right/left heart cath on 11/09/2023.  Interval History:  Still feels much better today; breathing is normal again. Ambulating okay.  Some ongoing bradycardia after coreg started and BP more stable today but did not receive Entresto yesterday.  Undergoing cath today.   Assessment and Plan: * Acute systolic (congestive) heart failure (HCC) - PND, orthopnea, cardiomegaly, pulmonary edema, LE edema, DOE/SOB - BNP 1,351 - FH of heart disease with early death in sister (age 59 yo) and dad with heart disease  - echo noted  with EF 30%, indeterminate diastology, global hypokinesis, RV systolic function moderately reduced - echo also notes frequent PVC burden - seems to be improved after lasix in the ER with minimal LE edema and improvement in dyspnea  - continue lasix as per cardiology - appreciate cardiology evaluation; further GDMT being slowly initiated - s/p left/right heart cath on 3/17; NICM, minimal CAD; still high PVC burden - A1c 5.8% and LDL 89.  TSH normal, 0.850 - continue asa and lipitor  - plan for HF clinic followup and GDMT at discharge; caution with bradycardia developing after BB initiated and some mild hypoTN  COPD GOLD III - some possible exacerbation but not severe with the 2nd hand smoke recently in the house, but workup more consistent with CHF -Significant improvement with diuresis -Continue albuterol as needed -Continue Breo Ellipta  Essential hypertension, benign - suspect further modification of meds  Elevated LFTs-resolved as of 11/08/2023 - suspect some congestive hepatopathy  -Normalized with diuresis.  No further trending needed   Old records reviewed in assessment of this patient  Antimicrobials:   DVT prophylaxis:  enoxaparin (LOVENOX) injection 40 mg Start: 11/06/23 0800 SCDs Start: 11/05/23 1712   Code Status:   Code Status: Full Code  Mobility Assessment (Last 72 Hours)     Mobility Assessment     Row Name 11/09/23 0725 11/08/23 2300 11/08/23 1025 11/07/23 2053 11/07/23 0945   Does patient have an order for bedrest or  is patient medically unstable No - Continue assessment No - Continue assessment No - Continue assessment No - Continue assessment No - Continue assessment   What is the highest level of mobility based on the progressive mobility assessment? Level 6 (Walks independently in room and hall) - Balance while walking in room without assist - Complete Level 6 (Walks independently in room and hall) - Balance while walking in room without assist - Complete  Level 6 (Walks independently in room and hall) - Balance while walking in room without assist - Complete Level 6 (Walks independently in room and hall) - Balance while walking in room without assist - Complete Level 6 (Walks independently in room and hall) - Balance while walking in room without assist - Complete    Row Name 11/06/23 2100           Does patient have an order for bedrest or is patient medically unstable No - Continue assessment       What is the highest level of mobility based on the progressive mobility assessment? Level 6 (Walks independently in room and hall) - Balance while walking in room without assist - Complete                Barriers to discharge: None Disposition Plan: Home HH orders placed: N/A Status is: Inpatient  Objective: Blood pressure 134/75, pulse (!) 38, temperature 98.1 F (36.7 C), temperature source Oral, resp. rate 14, height 5\' 5"  (1.651 m), weight 76.6 kg, last menstrual period 05/16/2013, SpO2 98%.  Examination:  Physical Exam Constitutional:      Appearance: Normal appearance.  HENT:     Head: Normocephalic and atraumatic.     Mouth/Throat:     Mouth: Mucous membranes are moist.  Eyes:     Extraocular Movements: Extraocular movements intact.  Cardiovascular:     Rate and Rhythm: Normal rate. Rhythm irregular.  Pulmonary:     Effort: Pulmonary effort is normal. No respiratory distress.     Breath sounds: No wheezing or rales.  Abdominal:     General: Bowel sounds are normal. There is no distension.     Palpations: Abdomen is soft.     Tenderness: There is no abdominal tenderness.  Musculoskeletal:        General: Normal range of motion.     Cervical back: Normal range of motion and neck supple.     Right lower leg: Edema (1+, improved) present.     Left lower leg: Edema (1+, improved) present.  Skin:    General: Skin is warm and dry.  Neurological:     General: No focal deficit present.     Mental Status: She is alert.   Psychiatric:        Mood and Affect: Mood normal.      Consultants:  Cardiology  Procedures:    Data Reviewed: Results for orders placed or performed during the hospital encounter of 11/05/23 (from the past 24 hours)  CBC with Differential/Platelet     Status: Abnormal   Collection Time: 11/09/23  4:53 AM  Result Value Ref Range   WBC 4.2 4.0 - 10.5 K/uL   RBC 5.30 (H) 3.87 - 5.11 MIL/uL   Hemoglobin 15.5 (H) 12.0 - 15.0 g/dL   HCT 56.2 (H) 13.0 - 86.5 %   MCV 93.4 80.0 - 100.0 fL   MCH 29.2 26.0 - 34.0 pg   MCHC 31.3 30.0 - 36.0 g/dL   RDW 78.4 69.6 - 29.5 %  Platelets 242 150 - 400 K/uL   nRBC 0.0 0.0 - 0.2 %   Neutrophils Relative % 33 %   Neutro Abs 1.4 (L) 1.7 - 7.7 K/uL   Lymphocytes Relative 51 %   Lymphs Abs 2.2 0.7 - 4.0 K/uL   Monocytes Relative 14 %   Monocytes Absolute 0.6 0.1 - 1.0 K/uL   Eosinophils Relative 1 %   Eosinophils Absolute 0.0 0.0 - 0.5 K/uL   Basophils Relative 1 %   Basophils Absolute 0.0 0.0 - 0.1 K/uL   Immature Granulocytes 0 %   Abs Immature Granulocytes 0.00 0.00 - 0.07 K/uL  Magnesium     Status: None   Collection Time: 11/09/23  4:53 AM  Result Value Ref Range   Magnesium 2.1 1.7 - 2.4 mg/dL  Basic metabolic panel     Status: Abnormal   Collection Time: 11/09/23  4:53 AM  Result Value Ref Range   Sodium 142 135 - 145 mmol/L   Potassium 4.0 3.5 - 5.1 mmol/L   Chloride 102 98 - 111 mmol/L   CO2 32 22 - 32 mmol/L   Glucose, Bld 96 70 - 99 mg/dL   BUN 22 8 - 23 mg/dL   Creatinine, Ser 5.36 0.44 - 1.00 mg/dL   Calcium 8.7 (L) 8.9 - 10.3 mg/dL   GFR, Estimated >64 >40 mL/min   Anion gap 8 5 - 15  POCT I-Stat EG7     Status: Abnormal   Collection Time: 11/09/23  1:43 PM  Result Value Ref Range   pH, Ven 7.408 7.25 - 7.43   pCO2, Ven 53.1 44 - 60 mmHg   pO2, Ven 35 32 - 45 mmHg   Bicarbonate 33.5 (H) 20.0 - 28.0 mmol/L   TCO2 35 (H) 22 - 32 mmol/L   O2 Saturation 66 %   Acid-Base Excess 7.0 (H) 0.0 - 2.0 mmol/L   Sodium  142 135 - 145 mmol/L   Potassium 3.4 (L) 3.5 - 5.1 mmol/L   Calcium, Ion 1.15 1.15 - 1.40 mmol/L   HCT 47.0 (H) 36.0 - 46.0 %   Hemoglobin 16.0 (H) 12.0 - 15.0 g/dL   Sample type VENOUS    Comment NOTIFIED PHYSICIAN   I-STAT 7, (LYTES, BLD GAS, ICA, H+H)     Status: Abnormal   Collection Time: 11/09/23  1:45 PM  Result Value Ref Range   pH, Arterial 7.456 (H) 7.35 - 7.45   pCO2 arterial 46.3 32 - 48 mmHg   pO2, Arterial 67 (L) 83 - 108 mmHg   Bicarbonate 32.6 (H) 20.0 - 28.0 mmol/L   TCO2 34 (H) 22 - 32 mmol/L   O2 Saturation 94 %   Acid-Base Excess 7.0 (H) 0.0 - 2.0 mmol/L   Sodium 142 135 - 145 mmol/L   Potassium 3.5 3.5 - 5.1 mmol/L   Calcium, Ion 1.12 (L) 1.15 - 1.40 mmol/L   HCT 46.0 36.0 - 46.0 %   Hemoglobin 15.6 (H) 12.0 - 15.0 g/dL   Sample type ARTERIAL     I have reviewed pertinent nursing notes, vitals, labs, and images as necessary. I have ordered labwork to follow up on as indicated.  I have reviewed the last notes from staff over past 24 hours. I have discussed patient's care plan and test results with nursing staff, CM/SW, and other staff as appropriate.  Time spent: Greater than 50% of the 55 minute visit was spent in counseling/coordination of care for the patient as laid out in the  A&P.   LOS: 4 days   Lewie Chamber, MD [facility] 11/09/2023, 2:55 PM

## 2023-11-09 NOTE — Interval H&P Note (Signed)
 History and Physical Interval Note:  11/09/2023 1:04 PM  Amber Shaw  has presented today for surgery, with the diagnosis of acute HFrEF.  The various methods of treatment have been discussed with the patient and family. After consideration of risks, benefits and other options for treatment, the patient has consented to  Procedure(s): RIGHT/LEFT HEART CATH AND CORONARY ANGIOGRAPHY (N/A) as a surgical intervention.  The patient's history has been reviewed, patient examined, no change in status, stable for surgery.  I have reviewed the patient's chart and labs.  Questions were answered to the patient's satisfaction.    Cath Lab Visit (complete for each Cath Lab visit)  Clinical Evaluation Leading to the Procedure:   ACS: No.  Non-ACS:    Anginal/Heart Failure Classification: NYHA class IV  Anti-ischemic medical therapy: Minimal Therapy (1 class of medications)  Non-Invasive Test Results: No non-invasive testing performed (LVEF 30% by echo -> high risk)  Prior CABG: No previous CABG        Amber Shaw

## 2023-11-09 NOTE — Progress Notes (Addendum)
 Rounding Note    Patient Name: Amber Shaw Date of Encounter: 11/09/2023  [facility] Cardiologist: Rollene Rotunda, MD   Subjective   Feels "100% better" than when she first arrived at hospital. She does reports feeling extra beats but denies any SOB, CP, dizziness. Able to walk hallway without any complaints and without oxygen. She is only wearing oxygen to substitute for CPAP.   Inpatient Medications    Scheduled Meds:  aspirin EC  81 mg Oral Daily   atorvastatin  40 mg Oral Daily   carvedilol  3.125 mg Oral BID WC   enoxaparin (LOVENOX) injection  40 mg Subcutaneous Q24H   fluticasone furoate-vilanterol  1 puff Inhalation Daily   furosemide  40 mg Intravenous BID   pneumococcal 20-valent conjugate vaccine  0.5 mL Intramuscular Tomorrow-1000   sacubitril-valsartan  1 tablet Oral BID   sodium chloride flush  3 mL Intravenous Q12H   Continuous Infusions:  sodium chloride 10 mL/hr at 11/09/23 0122   PRN Meds: acetaminophen **OR** [DISCONTINUED] acetaminophen, albuterol, polyethylene glycol   Vital Signs    Vitals:   11/08/23 1759 11/08/23 1927 11/08/23 2316 11/09/23 0521  BP: 133/76 114/63  (!) 110/49  Pulse: (!) 52 (!) 42  (!) 59  Resp:  17 20 18   Temp:  97.8 F (36.6 C)  98.1 F (36.7 C)  TempSrc:    Oral  SpO2:  99%  99%  Weight:      Height:        Intake/Output Summary (Last 24 hours) at 11/09/2023 0731 Last data filed at 11/09/2023 1308 Gross per 24 hour  Intake 379.09 ml  Output 1300 ml  Net -920.91 ml      11/08/2023    5:00 AM 11/07/2023   10:00 AM 11/06/2023    5:00 AM  Last 3 Weights  Weight (lbs) 168 lb 14 oz 164 lb 1.6 oz 178 lb 9.2 oz  Weight (kg) 76.6 kg 74.435 kg 81 kg      Telemetry    NSR, mixed PVCs and PACs, HR 80's - Personally Reviewed  ECG    None new - Personally Reviewed  Physical Exam   GEN: Laying in the bed with No acute distress.  Wearing O2 [place] 2L.  Neck: No JVD Cardiac: Regular with extra beats, no  murmurs, rubs, or gallops.  Respiratory: diminished breath Sound bilaterally  GI: Soft, nontender, non-distended  MS: Trace edema, nonpitting; No deformity. Neuro:  Nonfocal  Psych: Normal affect   Labs    High Sensitivity Troponin:   Recent Labs  Lab 11/05/23 1805 11/06/23 0820  TROPONINIHS 30* 34*     Chemistry Recent Labs  Lab 11/06/23 0537 11/07/23 0643 11/08/23 0531 11/09/23 0453  NA 139 142 142 142  K 3.5 3.4* 3.7 4.0  CL 99 102 101 102  CO2 32 30 31 32  GLUCOSE 100* 94 109* 96  BUN 9 11 19 22   CREATININE 0.72 0.69 0.80 0.82  CALCIUM 8.7* 8.5* 8.9 8.7*  MG  --  2.0 2.0 2.1  PROT 6.2*  --  6.6  --   ALBUMIN 3.4*  --  3.4*  --   AST 42*  --  17  --   ALT 79*  --  44  --   ALKPHOS 54  --  58  --   BILITOT 0.9  --  1.0  --   GFRNONAA >60 >60 >60 >60  ANIONGAP 8 10 10 8     Lipids  Recent Labs  Lab 11/06/23 0533  CHOL 154  TRIG 102  HDL 45  LDLCALC 89  CHOLHDL 3.4    Hematology Recent Labs  Lab 11/07/23 0643 11/08/23 0531 11/09/23 0453  WBC 3.0* 4.3 4.2  RBC 4.74 5.15* 5.30*  HGB 14.0 15.1* 15.5*  HCT 44.3 47.8* 49.5*  MCV 93.5 92.8 93.4  MCH 29.5 29.3 29.2  MCHC 31.6 31.6 31.3  RDW 14.8 14.6 14.7  PLT 224 241 242   Thyroid  Recent Labs  Lab 11/07/23 1126  TSH 0.850    BNP Recent Labs  Lab 11/05/23 1334  BNP 1,351.2*    DDimer No results for input(s): "DDIMER" in the last 168 hours.   Radiology    No results found.  Cardiac Studies   ECHO IMPRESSIONS 11/06/23  1. Left ventricular ejection fraction, by estimation, is 30%. The left  ventricle has moderately decreased function. The left ventricle  demonstrates global hypokinesis. There is mild concentric left ventricular  hypertrophy. Left ventricular diastolic  parameters are indeterminate.   2. Right ventricular systolic function is moderately reduced. The right  ventricular size is normal. There is normal pulmonary artery systolic  pressure.   3. Left atrial size was  moderately dilated.   4. The mitral valve is normal in structure. Mild mitral valve  regurgitation. No evidence of mitral stenosis.   5. The aortic valve is tricuspid. There is mild calcification of the  aortic valve. Aortic valve regurgitation is not visualized. Aortic valve  sclerosis/calcification is present, without any evidence of aortic  stenosis.   6. The inferior vena cava is normal in size with greater than 50%  respiratory variability, suggesting right atrial pressure of 3 mmHg.   Conclusion(s)/Recommendation(s): Frequent PVCs during study. 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   Patient Profile     63 y.o. female  with pmh of HTN, COPD, sleep apnea, former  tobacco use, arthritis who presents with edema and SOB then found to have new onset HFrEF, EF 30%.  Assessment & Plan    New onset acute on HFrEF  Presented with LE edema, SOB, PND, orthopnea. BNP 1351.Marland Kitchen CXR with cardiomegaly and mild pulmonary edema. ECHO reveals LVEF 30%, LV moderately decreased function, LV global hypokinesis, mild concentric LVH, RV systolic function moderately reduced , LA moderately dilated, mild MVR, mild calcification of aorta. ECHO also noted frequent PVC burden. TSH WNL, A1C 5.8% - Currently on IV lasix  - net I/O: -2651, wt 176 > 168, Cr 0.82, reports good urine output, PE: trace edema, diminished lung bases - Continue IV lasix for today. Can plan to switch to PO tomorrow.  - monitor I/O, weight and renal functions  - [name] was held yesterday due to hypotension but pt denied any dizziness or lightlessness at that time.  - GDMT: Currently  Coreg 3.125. Scheduled to restart Entresto 24-26 this am. Will plan to add Farixiga and spironolactone  - this am,  reports palpitations but denies any other complaints. Able to walk hallway without any concerns or O2. Only using supplemental O2 for CPAP substitute.   - Scheduled for L/R cath today. Based on results, can make  management adjustments  Mild MR  -  see echo above  -  will follow clinically   Ventricular bigeminy  - K 4.0,  Mg 2.1, TSH WNL - Telemetry this am: NSR, ,mixed PVCs and PAC's, HR 80's  - improvement in PVC burden since admission  - this am,  reports  palpitations but denies any other complaints - currently on Coreg 3.125.  HTN  - being managed by HF treatment. See above.   HLD Former Tobacco use - LDL 89 - Continue home med: Lipitor 40 mg, ASA 81 mg    Otherwise managed per primary - COPD  For questions or updates, please contact [facility] Please consult [email or web address] for contact info under        Signed, Basilio Cairo, PA-C  11/09/2023, 7:31 AM     I have seen and examined the patient along with Basilio Cairo, PA-C.  I have reviewed the chart, notes and new data.  I agree with PA/NP's note. Seen immediately after right and left heart catheterization. Key new complaints: She is breathing much better.  She has not had problems with chest pain.  Wearing TR band following her right radial access catheterization.  She reports that she has had several family members, including her father who died suddenly or had cardiac arrest. Key examination changes: Jugular venous pulsations are normal, lungs are clear, regular rate and rhythm with occasional ectopy, no significant murmurs rubs or gallops, no lower extremity edema Key new findings / data: ECG as far back as 2014 shows frequent PVCs, probable LVH and extremely prominent P waves in the inferior leads and dominant negative P waves in lead V1, suggesting that the cardiomyopathy is longstanding.  Reviewed the echocardiogram.  No clear specific cardiomyopathy identified by echo criteria.  Cardiac catheterization today shows mild coronary atherosclerosis without any significant stenoses and normal right and left heart filling pressures with mildly reduced cardiac index.  PLAN: Currently euvolemic. Discussed the  concept of "dry weight", dietary sodium restriction, daily weight monitoring, signs and symptoms of heart failure exacerbation, how to deal with unexpected weight gains of more than 3 pounds in 24 hours or 5 pounds in a week. Will gradually introduce guideline directed medical therapy for heart failure to include beta-blockers (will increase the dose today since she is euvolemic), Entresto, spironolactone, SGLT2 inhibitor.  Discussed the purpose of the various ingredients of the guideline directed medical regimen at length.  Unclear whether PVCs are a consequence of the cardiomyopathy or contributing to heart failure via PVC related cardiomyopathy.  Possible that there is a familial dilated cardiomyopathy involved.  I think at some point she should have a cardiac MRI.  Although coronary atherosclerosis is not the cause for her cardiomyopathy, she does have significant calcified atherosclerotic lesions and aggressive lipid-lowering is indicated.  Unless there are problems overnight, I think it is reasonable for her to be discharged in the morning.  For early follow-up in the heart failure impact clinic.  Thurmon Fair, MD, [facility] [phone] 11/09/2023, 3:18 PM

## 2023-11-10 ENCOUNTER — Other Ambulatory Visit (HOSPITAL_COMMUNITY): Payer: Self-pay

## 2023-11-10 ENCOUNTER — Encounter (HOSPITAL_COMMUNITY): Payer: Self-pay | Admitting: Internal Medicine

## 2023-11-10 ENCOUNTER — Other Ambulatory Visit: Payer: Self-pay

## 2023-11-10 DIAGNOSIS — J449 Chronic obstructive pulmonary disease, unspecified: Secondary | ICD-10-CM | POA: Diagnosis not present

## 2023-11-10 DIAGNOSIS — I493 Ventricular premature depolarization: Secondary | ICD-10-CM

## 2023-11-10 DIAGNOSIS — I5041 Acute combined systolic (congestive) and diastolic (congestive) heart failure: Secondary | ICD-10-CM | POA: Diagnosis not present

## 2023-11-10 LAB — CBC WITH DIFFERENTIAL/PLATELET
Abs Immature Granulocytes: 0.02 10*3/uL (ref 0.00–0.07)
Basophils Absolute: 0 10*3/uL (ref 0.0–0.1)
Basophils Relative: 1 %
Eosinophils Absolute: 0 10*3/uL (ref 0.0–0.5)
Eosinophils Relative: 1 %
HCT: 47.5 % — ABNORMAL HIGH (ref 36.0–46.0)
Hemoglobin: 15.2 g/dL — ABNORMAL HIGH (ref 12.0–15.0)
Immature Granulocytes: 1 %
Lymphocytes Relative: 39 %
Lymphs Abs: 1.7 10*3/uL (ref 0.7–4.0)
MCH: 29.5 pg (ref 26.0–34.0)
MCHC: 32 g/dL (ref 30.0–36.0)
MCV: 92.2 fL (ref 80.0–100.0)
Monocytes Absolute: 0.5 10*3/uL (ref 0.1–1.0)
Monocytes Relative: 12 %
Neutro Abs: 2.1 10*3/uL (ref 1.7–7.7)
Neutrophils Relative %: 46 %
Platelets: 237 10*3/uL (ref 150–400)
RBC: 5.15 MIL/uL — ABNORMAL HIGH (ref 3.87–5.11)
RDW: 14.6 % (ref 11.5–15.5)
WBC: 4.3 10*3/uL (ref 4.0–10.5)
nRBC: 0 % (ref 0.0–0.2)

## 2023-11-10 LAB — BASIC METABOLIC PANEL
Anion gap: 10 (ref 5–15)
BUN: 23 mg/dL (ref 8–23)
CO2: 30 mmol/L (ref 22–32)
Calcium: 8.7 mg/dL — ABNORMAL LOW (ref 8.9–10.3)
Chloride: 97 mmol/L — ABNORMAL LOW (ref 98–111)
Creatinine, Ser: 0.76 mg/dL (ref 0.44–1.00)
GFR, Estimated: >60 mL/min (ref >60)
Glucose, Bld: 113 mg/dL — ABNORMAL HIGH (ref 70–99)
Potassium: 3.3 mmol/L — ABNORMAL LOW (ref 3.5–5.1)
Sodium: 137 mmol/L (ref 135–145)

## 2023-11-10 LAB — MAGNESIUM: Magnesium: 2.2 mg/dL (ref 1.7–2.4)

## 2023-11-10 MED ORDER — CARVEDILOL 3.125 MG PO TABS
3.1250 mg | ORAL_TABLET | Freq: Two times a day (BID) | ORAL | 3 refills | Status: DC
  Filled 2023-11-10: qty 60, 30d supply, fill #0

## 2023-11-10 MED ORDER — POTASSIUM CHLORIDE CRYS ER 15 MEQ PO TBCR
30.0000 meq | EXTENDED_RELEASE_TABLET | Freq: Two times a day (BID) | ORAL | 3 refills | Status: DC
  Filled 2023-11-10: qty 60, 15d supply, fill #0

## 2023-11-10 MED ORDER — CARVEDILOL 3.125 MG PO TABS
3.1250 mg | ORAL_TABLET | Freq: Two times a day (BID) | ORAL | Status: DC
  Filled 2023-11-10: qty 1

## 2023-11-10 MED ORDER — ASPIRIN 81 MG PO TBEC
81.0000 mg | DELAYED_RELEASE_TABLET | Freq: Every day | ORAL | 3 refills | Status: DC
  Filled 2023-11-10: qty 120, 120d supply, fill #0
  Filled 2024-01-26: qty 120, 120d supply, fill #1

## 2023-11-10 MED ORDER — TORSEMIDE 20 MG PO TABS
20.0000 mg | ORAL_TABLET | Freq: Every day | ORAL | Status: DC
  Administered 2023-11-10: 20 mg via ORAL
  Filled 2023-11-10: qty 1

## 2023-11-10 MED ORDER — FLUTICASONE-SALMETEROL 250-50 MCG/ACT IN AEPB
1.0000 | INHALATION_SPRAY | Freq: Two times a day (BID) | RESPIRATORY_TRACT | 3 refills | Status: DC
Start: 2023-11-10 — End: 2023-11-10
  Filled 2023-11-10: qty 60, 30d supply, fill #0

## 2023-11-10 MED ORDER — CARVEDILOL 3.125 MG PO TABS
3.1250 mg | ORAL_TABLET | Freq: Two times a day (BID) | ORAL | 3 refills | Status: AC
  Filled 2023-11-10: qty 180, 90d supply, fill #0
  Filled 2024-02-16: qty 180, 90d supply, fill #1
  Filled 2024-08-16: qty 180, 90d supply, fill #2

## 2023-11-10 MED ORDER — ASPIRIN 81 MG PO TBEC
81.0000 mg | DELAYED_RELEASE_TABLET | Freq: Every day | ORAL | 3 refills | Status: DC
  Filled 2023-11-10: qty 90, 90d supply, fill #0

## 2023-11-10 MED ORDER — ATORVASTATIN CALCIUM 40 MG PO TABS
40.0000 mg | ORAL_TABLET | Freq: Every day | ORAL | 3 refills | Status: DC
  Filled 2023-11-10: qty 90, 90d supply, fill #0
  Filled 2024-02-16 – 2024-02-23 (×2): qty 90, 90d supply, fill #1
  Filled 2024-05-23: qty 90, 90d supply, fill #2

## 2023-11-10 MED ORDER — FLUTICASONE-SALMETEROL 250-50 MCG/ACT IN AEPB
1.0000 | INHALATION_SPRAY | Freq: Two times a day (BID) | RESPIRATORY_TRACT | 3 refills | Status: DC
  Filled 2023-11-10: qty 60, 30d supply, fill #0

## 2023-11-10 MED ORDER — POTASSIUM CHLORIDE CRYS ER 20 MEQ PO TBCR
30.0000 meq | EXTENDED_RELEASE_TABLET | Freq: Two times a day (BID) | ORAL | Status: DC
  Administered 2023-11-10: 30 meq via ORAL
  Filled 2023-11-10: qty 1

## 2023-11-10 MED ORDER — TORSEMIDE 20 MG PO TABS
20.0000 mg | ORAL_TABLET | Freq: Every day | ORAL | 3 refills | Status: AC
  Filled 2023-11-10: qty 90, 90d supply, fill #0
  Filled 2024-02-23: qty 90, 90d supply, fill #1
  Filled 2024-05-23 – 2024-08-16 (×2): qty 90, 90d supply, fill #2

## 2023-11-10 MED ORDER — TORSEMIDE 20 MG PO TABS
20.0000 mg | ORAL_TABLET | Freq: Every day | ORAL | 3 refills | Status: DC
  Filled 2023-11-10: qty 30, 30d supply, fill #0

## 2023-11-10 MED ORDER — POTASSIUM CHLORIDE ER 8 MEQ PO TBCR
16.0000 meq | EXTENDED_RELEASE_TABLET | Freq: Two times a day (BID) | ORAL | 0 refills | Status: DC
  Filled 2023-11-10: qty 28, 7d supply, fill #0

## 2023-11-10 MED ORDER — ATORVASTATIN CALCIUM 40 MG PO TABS
40.0000 mg | ORAL_TABLET | Freq: Every day | ORAL | 3 refills | Status: DC
Start: 2023-11-11 — End: 2023-11-10
  Filled 2023-11-10: qty 30, 30d supply, fill #0

## 2023-11-10 MED ORDER — VALSARTAN 40 MG PO TABS
40.0000 mg | ORAL_TABLET | Freq: Every day | ORAL | 3 refills | Status: DC
  Filled 2023-11-10: qty 90, 90d supply, fill #0

## 2023-11-10 MED ORDER — FLUTICASONE FUROATE-VILANTEROL 200-25 MCG/ACT IN AEPB
1.0000 | INHALATION_SPRAY | Freq: Every day | RESPIRATORY_TRACT | 3 refills | Status: DC
  Filled 2023-11-10: qty 60, 60d supply, fill #0

## 2023-11-10 MED ORDER — SACUBITRIL-VALSARTAN 24-26 MG PO TABS
1.0000 | ORAL_TABLET | Freq: Two times a day (BID) | ORAL | 3 refills | Status: DC
  Filled 2023-11-10: qty 60, 30d supply, fill #0

## 2023-11-10 MED ORDER — LIVING BETTER WITH HEART FAILURE BOOK: Freq: Once | Status: DC

## 2023-11-10 NOTE — Progress Notes (Signed)
 Heart Failure Nurse Navigator Progress Note  PCP: Patient, [name] PCP-Cardiologist: [facility] Admission Diagnosis: Tachypnea, shortness of breath, Acute congestive heart failure.  Admitted from: Home to [facility]  Presentation:   Amber Shaw presented with shortness of breath on excerption, has chronic leg edema, reports that she switched to a nicotine vape 2 weeks ago, but used to be a heavy cigarette smoker in the past. 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 Normal left heart, right heart, and pulmonary artery pressures. Low normal to mildly reduced Fick cardiac output/index.    Patient was called into her room at [facility] and updated on the sign and symptoms of heart failure, daily weights, when to call her doctor or go to the ED. Diet/ fluid restrictions, taking all medications as prescribed and attending all medical appointments. Patient verbalized her understanding of all education, a HF TOC appointment was scheduled for 11/17/23 @ 9:30 am.   Navigator reached out thru secure chat to patients nurse for the day Emmaline Life, RN to bring her a "Living Better with Heart Failure " book. RN responded she would bring the patient a book before her discharge.   ECHO/ LVEF: 30%  Clinical Course:  Past Medical History:  Diagnosis Date   Arthritis    Asthma    COPD (chronic obstructive pulmonary disease) (HCC)    Hypertension    Sleep apnea      Social History   Socioeconomic History   Marital status: Single    Spouse name: Not on file   Number of children: 2   Years of education: Not on file   Highest education level: High school graduate  Occupational History   Occupation: Not working  Tobacco Use   Smoking status: Former    Current packs/day: 0.00    Average packs/day: 0.3 packs/day for 18.0 years (4.5 ttl pk-yrs)    Types: Cigarettes     Start date: 11/12/1996    Quit date: 11/13/2014    Years since quitting: 8.9   Smokeless tobacco: Never  Vaping Use   Vaping status: Former   Substances: Flavoring  Substance and Sexual Activity   Alcohol use: No    Alcohol/week: 0.0 standard drinks of alcohol   Drug use: No   Sexual activity: Not on file  Other Topics Concern   Not on file  Social History Narrative   Lives alone.     Two children.     Social Drivers of Corporate investment banker Strain: Low Risk  (11/10/2023)   Overall Financial Resource Strain (CARDIA)    Difficulty of Paying Living Expenses: Not very hard  Food Insecurity: No Food Insecurity (11/05/2023)   Hunger Vital Sign    Worried About Running Out of Food in the Last Year: Never true    Ran Out of Food in the Last Year: Never true  Transportation Needs: No Transportation Needs (11/10/2023)   PRAPARE - Administrator, Civil Service (Medical): No    Lack of Transportation (Non-Medical): No  Physical Activity: Not on file  Stress: Not on file  Social Connections: Not on file   Education Assessment and Provision:  Detailed education and instructions provided on heart failure disease management including the following:  Signs and symptoms of Heart Failure When to call the physician Importance of daily weights Low sodium diet Fluid restriction Medication management Anticipated future follow-up appointments  Patient education given on each of the above topics.  Patient acknowledges understanding via teach back method and acceptance of all instructions.  Education Materials:  "Living Better With Heart Failure" Booklet, HF zone tool, & Daily Weight Tracker Tool.  Patient has scale at home: Yes Patient has pill box at home: Yes    High Risk Criteria for Readmission and/or Poor Patient Outcomes: Heart failure hospital admissions (last 6 months): 1  No Show rate: 29% Difficult social situation: recently moved back to [place] from [place], living  alone in apartment, currently not working.  Demonstrates medication adherence: Yes Primary Language: English Literacy level: Reading, writing, and comprehension  Barriers of Care:   Diet/ fluid restrictions Daily weights  Considerations/Referrals:   Referral made to Heart Failure Pharmacist Stewardship: No, from [facility]  Referral made to Heart Failure CSW/NCM TOC: No Referral made to Heart & Vascular TOC clinic: Yes, 11/17/23 @ 9:30 am.   Items for Follow-up on DC/TOC: Continued HF education Diet/ fluid restrictions/ daily weights   Rhae Hammock, BSN, RN Heart Failure Teacher, adult education Only

## 2023-11-10 NOTE — Plan of Care (Signed)

## 2023-11-10 NOTE — Discharge Summary (Signed)
 Physician Discharge Summary   [name] AVW:[id number] DOB: 31-Oct-1960 DOA: 11/05/2023  PCP: Patient, [name]  Admit date: 11/05/2023 Discharge date: 11/10/2023  Admitted From: Home Disposition:  Home  Discharging physician: Lewie Chamber, MD Barriers to discharge: none  Recommendations at discharge: Follow up with cardiology; adjust regimen based on BP/HR as needed Cardiac MRI recommended after insurance obtained per cardiology   Discharge Condition: stable CODE STATUS: Full  Diet recommendation:  Diet Orders (From admission, onward)     Start     Ordered   11/10/23 0000  Diet - low sodium heart healthy        11/10/23 1033   11/09/23 1700  Diet Heart Room service appropriate? Yes; Fluid consistency: Thin; Fluid restriction: 2000 mL Fluid  Diet effective now       Question Answer Comment  Room service appropriate? Yes   Fluid consistency: Thin   Fluid restriction: 2000 mL Fluid      11/09/23 1659            Hospital Course: Amber Shaw is a 63 yo female with PMH COPD, sleep apnea, HTN, former tobacco use, arthritis who presented with worsening dyspnea. This became a problem over the past couple weeks and typically she does not become short of breath with walking.  She also endorsed over the past week that she is now using 3 pillows to sleep instead of her prior 2 pillows and typically would sleep all 8 hours during the night, but recently is waking up in the middle of the night short of breath as well.  She does wear her CPAP mask at home.  Recently she has also been exposed to secondhand smoke from her sister for the past 3 weeks who is visiting.  Patient has quit smoking for the last 3 years.  She endorses heart disease in her family.  Her sister passed away at 76 years old and also heart disease in her dad and maternal aunt.  CXR on workup showed cardiomegaly with mild pulmonary edema.  BNP also elevated 1,351. Troponin trend was flat (30>>34).  She received  a dose of Lasix in the ER with good diuresis and improvement in her symptoms.  EKG reviewed does show a history of frequent PVCs and again seen on EKG on admission.  Echo obtained after admission showing reduced EF, 30%, global hypokinesis. She underwent cardiology evaluation and right/left heart cath on 11/09/2023.  Assessment and Plan: * Acute combined systolic and diastolic heart failure (HCC) - PND, orthopnea, cardiomegaly, pulmonary edema, LE edema, DOE/SOB - BNP 1,351 - FH of heart disease with early death in sister (age 66 yo) and dad with heart disease  - echo noted with EF 30%, indeterminate diastology, global hypokinesis, RV systolic function moderately reduced - echo also notes frequent PVC burden - seems to be improved after lasix in the ER with minimal LE edema and improvement in dyspnea  - continue lasix as per cardiology - appreciate cardiology evaluation; further GDMT being slowly initiated - s/p left/right heart cath on 3/17; NICM, minimal CAD; still high PVC burden - A1c 5.8% and LDL 89.  TSH normal, 0.850 - continue asa and lipitor  - plan for HF clinic followup and GDMT at discharge; caution with bradycardia developing after BB initiated and some mild hypoTN - d/c meds inlcude: valsartan, coreg, torsemide (not yet transferred medicaid to  from [place] so unable to obtain Entresto at this time)  COPD GOLD III - some possible exacerbation but not  severe with the 2nd hand smoke recently in the house, but workup more consistent with CHF -Significant improvement with diuresis -Due to no insurance yet, unable to afford Breo; coupon provided for obtaining Advair at a cheaper cost  Essential hypertension, benign - Discharged with Coreg, valsartan, torsemide -Patient instructed to watch blood pressure and heart rate at discharge as some borderline values with new medications  Elevated LFTs-resolved as of 11/08/2023 - suspect some congestive hepatopathy  -Normalized with  diuresis.  No further trending needed       The patient's acute and chronic medical conditions were treated accordingly. On day of discharge, patient was felt deemed stable for discharge. Patient/family member advised to call PCP or come back to ER if needed.   Principal Diagnosis: Acute combined systolic and diastolic heart failure [facility])  Discharge Diagnoses: Active Hospital Problems   Diagnosis Date Noted   Acute combined systolic and diastolic heart failure (HCC) 11/05/2023    Priority: 1.   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   COPD GOLD III 12/06/2014   Essential hypertension, benign 04/21/2013    Resolved Hospital Problems   Diagnosis Date Noted Date Resolved   Elevated LFTs 11/06/2023 11/08/2023     Discharge Instructions     Diet - low sodium heart healthy   Complete by: As directed    Increase activity slowly   Complete by: As directed       Allergies as of 11/10/2023       Reactions   Percocet [oxycodone-acetaminophen] Other (See Comments)   Painful urination         Medication List     STOP taking these medications    ibuprofen 200 MG tablet Commonly known as: ADVIL       TAKE these medications    albuterol 108 (90 Base) MCG/ACT inhaler Commonly known as: Ventolin HFA Inhale 2 puffs into the lungs every 4 (four) hours as needed for wheezing or shortness of breath.   aspirin EC 81 MG tablet Take 1 tablet (81 mg total) by mouth daily. Swallow whole. Start taking on: November 11, 2023   atorvastatin 40 MG tablet Commonly known as: LIPITOR Take 1 tablet (40 mg total) by mouth daily. Start taking on: November 11, 2023   carvedilol 3.125 MG tablet Commonly known as: COREG Take 1 tablet (3.125 mg total) by mouth 2 (two) times daily with a meal.   fluticasone-salmeterol 250-50 MCG/ACT Aepb Commonly known as: Advair Diskus Inhale 1 puff into the lungs in the morning and at bedtime.   potassium chloride 8 MEQ tablet Commonly known as: KLOR-CON Take 2  tablets (16 mEq total) by mouth 2 (two) times daily for 7 days.   torsemide 20 MG tablet Commonly known as: DEMADEX Take 1 tablet (20 mg total) by mouth daily. Start taking on: November 11, 2023   valsartan 40 MG tablet Commonly known as: Diovan Take 1 tablet (40 mg total) by mouth daily.        Follow-up Information     [facility]. Go in 7 day(s).   Specialty: Cardiology Why: Hospital follow 11/17/2023 @ 9:30 am PLEASE bring a current medication list to appointment FREE valet parking, off [address] Entrance [address] [employer] information: [address] [place] [address] [phone]               Allergies  Allergen Reactions   Percocet [Oxycodone-Acetaminophen] Other (See Comments)    Painful urination     Consultations:  Cardiology  Procedures: 11/09/2023: Right and left heart cath  Discharge Exam: BP 99/62   Pulse 73   Temp 98.3 F (36.8 C)   Resp 18   Ht 5\' 5"  (1.651 m)   Wt 76.6 kg   LMP 05/16/2013   SpO2 96%   BMI 28.10 kg/m  Physical Exam Constitutional:      Appearance: Normal appearance.  HENT:     Head: Normocephalic and atraumatic.     Mouth/Throat:     Mouth: Mucous membranes are moist.  Eyes:     Extraocular Movements: Extraocular movements intact.  Cardiovascular:     Rate and Rhythm: Normal rate. Rhythm irregular.  Pulmonary:     Effort: Pulmonary effort is normal. No respiratory distress.     Breath sounds: No wheezing or rales.  Abdominal:     General: Bowel sounds are normal. There is no distension.     Palpations: Abdomen is soft.     Tenderness: There is no abdominal tenderness.  Musculoskeletal:        General: Normal range of motion.     Cervical back: Normal range of motion and neck supple.     Right lower leg: Edema (1+, improved) present.     Left lower leg: Edema (1+, improved) present.  Skin:    General: Skin is warm and dry.  Neurological:      General: No focal deficit present.     Mental Status: She is alert.  Psychiatric:        Mood and Affect: Mood normal.      The results of significant diagnostics from this hospitalization (including imaging, microbiology, ancillary and laboratory) are listed below for reference.   Microbiology: Recent Results (from the past 240 hours)  Resp panel by RT-PCR (RSV, Flu A&B, Covid) Anterior Nasal Swab     Status: None   Collection Time: 11/05/23  1:33 PM   Specimen: Anterior Nasal Swab  Result Value Ref Range Status   SARS Coronavirus 2 by RT PCR NEGATIVE NEGATIVE Final    Comment: (NOTE) SARS-CoV-2 target nucleic acids are NOT DETECTED.  The SARS-CoV-2 RNA is generally detectable in upper respiratory specimens during the acute phase of infection. The lowest concentration of SARS-CoV-2 viral copies this assay can detect is 138 copies/mL. A negative result does not preclude SARS-Cov-2 infection and should not be used as the sole basis for treatment or other patient management decisions. A negative result may occur with  improper specimen collection/handling, submission of specimen other than nasopharyngeal swab, presence of viral mutation(s) within the areas targeted by this assay, and inadequate number of viral copies(<138 copies/mL). A negative result must be combined with clinical observations, patient history, and epidemiological information. The expected result is Negative.  Fact Sheet for Patients:  BloggerCourse.com  Fact Sheet for Healthcare Providers:  SeriousBroker.it  This test is no t yet approved or cleared by the [place] FDA and  has been authorized for detection and/or diagnosis of SARS-CoV-2 by FDA under an Emergency Use Authorization (EUA). This EUA will remain  in effect (meaning this test can be used) for the duration of the COVID-19 declaration under Section 564(b)(1) of the Act, 21 U.S.C.section  360bbb-3(b)(1), unless the authorization is terminated  or revoked sooner.       Influenza A by PCR NEGATIVE NEGATIVE Final   Influenza B by PCR NEGATIVE NEGATIVE Final    Comment: (NOTE) The Xpert Xpress SARS-CoV-2/FLU/RSV plus assay is intended as an aid in the diagnosis of  influenza from Nasopharyngeal swab specimens and should not be used as a sole basis for treatment. Nasal washings and aspirates are unacceptable for Xpert Xpress SARS-CoV-2/FLU/RSV testing.  Fact Sheet for Patients: BloggerCourse.com  Fact Sheet for Healthcare Providers: SeriousBroker.it  This test is not yet approved or cleared by the [place] FDA and has been authorized for detection and/or diagnosis of SARS-CoV-2 by FDA under an Emergency Use Authorization (EUA). This EUA will remain in effect (meaning this test can be used) for the duration of the COVID-19 declaration under Section 564(b)(1) of the Act, 21 U.S.C. section 360bbb-3(b)(1), unless the authorization is terminated or revoked.     Resp Syncytial Virus by PCR NEGATIVE NEGATIVE Final    Comment: (NOTE) Fact Sheet for Patients: BloggerCourse.com  Fact Sheet for Healthcare Providers: SeriousBroker.it  This test is not yet approved or cleared by the [place] FDA and has been authorized for detection and/or diagnosis of SARS-CoV-2 by FDA under an Emergency Use Authorization (EUA). This EUA will remain in effect (meaning this test can be used) for the duration of the COVID-19 declaration under Section 564(b)(1) of the Act, 21 U.S.C. section 360bbb-3(b)(1), unless the authorization is terminated or revoked.  Performed at [facility], [address], [place], [place] [address]   Surgical pcr screen     Status: None   Collection Time: 11/08/23 12:45 PM   Specimen: Nasal Mucosa; Nasal Swab  Result Value Ref Range  Status   MRSA, PCR NEGATIVE NEGATIVE Final   Staphylococcus aureus NEGATIVE NEGATIVE Final    Comment: (NOTE) The Xpert SA Assay (FDA approved for NASAL specimens in patients 76 years of age and older), is one component of a comprehensive surveillance program. It is not intended to diagnose infection nor to guide or monitor treatment. Performed at [facility], [address], [place], [place] [address]      Labs: BNP (last 3 results) Recent Labs    11/05/23 1334  BNP 1,351.2*   Basic Metabolic Panel: Recent Labs  Lab 11/07/23 0643 11/08/23 0531 11/09/23 0453 11/09/23 1343 11/09/23 1345 11/09/23 1845 11/10/23 0531  NA 142 142 142 142 142 139 137  K 3.4* 3.7 4.0 3.4* 3.5 3.6 3.3*  CL 102 101 102  --   --  98 97*  CO2 30 31 32  --   --  31 30  GLUCOSE 94 109* 96  --   --  118* 113*  BUN 11 19 22   --   --  18 23  CREATININE 0.69 0.80 0.82  --   --  0.73 0.76  CALCIUM 8.5* 8.9 8.7*  --   --  9.1 8.7*  MG 2.0 2.0 2.1  --   --   --  2.2   Liver Function Tests: Recent Labs  Lab 11/06/23 0537 11/08/23 0531  AST 42* 17  ALT 79* 44  ALKPHOS 54 58  BILITOT 0.9 1.0  PROT 6.2* 6.6  ALBUMIN 3.4* 3.4*   No results for input(s): "LIPASE", "AMYLASE" in the last 168 hours. No results for input(s): "AMMONIA" in the last 168 hours. CBC: Recent Labs  Lab 11/07/23 0643 11/08/23 0531 11/09/23 0453 11/09/23 1343 11/09/23 1345 11/09/23 1845 11/10/23 0531  WBC 3.0* 4.3 4.2  --   --  3.9* 4.3  NEUTROABS 0.9* 1.6* 1.4*  --   --   --  2.1  HGB 14.0 15.1* 15.5* 16.0* 15.6* 17.3* 15.2*  HCT 44.3 47.8* 49.5* 47.0* 46.0 55.7* 47.5*  MCV 93.5 92.8 93.4  --   --  93.3 92.2  PLT 224 241 242  --   --  257 237   Cardiac Enzymes: No results for input(s): "CKTOTAL", "CKMB", "CKMBINDEX", "TROPONINI" in the last 168 hours. BNP: Invalid input(s): "POCBNP" CBG: No results for input(s): "GLUCAP" in the last 168 hours. D-Dimer No results for input(s): "DDIMER" in  the last 72 hours. Hgb A1c No results for input(s): "HGBA1C" in the last 72 hours. Lipid Profile No results for input(s): "CHOL", "HDL", "LDLCALC", "TRIG", "CHOLHDL", "LDLDIRECT" in the last 72 hours. Thyroid function studies No results for input(s): "TSH", "T4TOTAL", "T3FREE", "THYROIDAB" in the last 72 hours.  Invalid input(s): "FREET3" Anemia work up No results for input(s): "VITAMINB12", "FOLATE", "FERRITIN", "TIBC", "IRON", "RETICCTPCT" in the last 72 hours. Urinalysis    Component Value Date/Time   COLORURINE COLORLESS (A) 11/05/2023 1958   APPEARANCEUR CLEAR 11/05/2023 1958   LABSPEC 1.004 (L) 11/05/2023 1958   PHURINE 8.0 11/05/2023 1958   GLUCOSEU NEGATIVE 11/05/2023 1958   HGBUR NEGATIVE 11/05/2023 1958   BILIRUBINUR NEGATIVE 11/05/2023 1958   BILIRUBINUR neg 03/20/2014 0928   KETONESUR NEGATIVE 11/05/2023 1958   PROTEINUR NEGATIVE 11/05/2023 1958   UROBILINOGEN 0.2 11/13/2014 1923   NITRITE NEGATIVE 11/05/2023 1958   LEUKOCYTESUR NEGATIVE 11/05/2023 1958   Sepsis Labs Recent Labs  Lab 11/08/23 0531 11/09/23 0453 11/09/23 1845 11/10/23 0531  WBC 4.3 4.2 3.9* 4.3   Microbiology Recent Results (from the past 240 hours)  Resp panel by RT-PCR (RSV, Flu A&B, Covid) Anterior Nasal Swab     Status: None   Collection Time: 11/05/23  1:33 PM   Specimen: Anterior Nasal Swab  Result Value Ref Range Status   SARS Coronavirus 2 by RT PCR NEGATIVE NEGATIVE Final    Comment: (NOTE) SARS-CoV-2 target nucleic acids are NOT DETECTED.  The SARS-CoV-2 RNA is generally detectable in upper respiratory specimens during the acute phase of infection. The lowest concentration of SARS-CoV-2 viral copies this assay can detect is 138 copies/mL. A negative result does not preclude SARS-Cov-2 infection and should not be used as the sole basis for treatment or other patient management decisions. A negative result may occur with  improper specimen collection/handling, submission of  specimen other than nasopharyngeal swab, presence of viral mutation(s) within the areas targeted by this assay, and inadequate number of viral copies(<138 copies/mL). A negative result must be combined with clinical observations, patient history, and epidemiological information. The expected result is Negative.  Fact Sheet for Patients:  BloggerCourse.com  Fact Sheet for Healthcare Providers:  SeriousBroker.it  This test is no t yet approved or cleared by the [place] FDA and  has been authorized for detection and/or diagnosis of SARS-CoV-2 by FDA under an Emergency Use Authorization (EUA). This EUA will remain  in effect (meaning this test can be used) for the duration of the COVID-19 declaration under Section 564(b)(1) of the Act, 21 U.S.C.section 360bbb-3(b)(1), unless the authorization is terminated  or revoked sooner.       Influenza A by PCR NEGATIVE NEGATIVE Final   Influenza B by PCR NEGATIVE NEGATIVE Final    Comment: (NOTE) The Xpert Xpress SARS-CoV-2/FLU/RSV plus assay is intended as an aid in the diagnosis of influenza from Nasopharyngeal swab specimens and should not be used as a sole basis for treatment. Nasal washings and aspirates are unacceptable for Xpert Xpress SARS-CoV-2/FLU/RSV testing.  Fact Sheet for Patients: BloggerCourse.com  Fact Sheet for Healthcare Providers: SeriousBroker.it  This test is not  yet approved or cleared by the [place] and has been authorized for detection and/or diagnosis of SARS-CoV-2 by FDA under an Emergency Use Authorization (EUA). This EUA will remain in effect (meaning this test can be used) for the duration of the COVID-19 declaration under Section 564(b)(1) of the Act, 21 U.S.C. section 360bbb-3(b)(1), unless the authorization is terminated or revoked.     Resp Syncytial Virus by PCR NEGATIVE NEGATIVE Final     Comment: (NOTE) Fact Sheet for Patients: BloggerCourse.com  Fact Sheet for Healthcare Providers: SeriousBroker.it  This test is not yet approved or cleared by the [place] FDA and has been authorized for detection and/or diagnosis of SARS-CoV-2 by FDA under an Emergency Use Authorization (EUA). This EUA will remain in effect (meaning this test can be used) for the duration of the COVID-19 declaration under Section 564(b)(1) of the Act, 21 U.S.C. section 360bbb-3(b)(1), unless the authorization is terminated or revoked.  Performed at [facility], [address], [place], [place] [address]   Surgical pcr screen     Status: None   Collection Time: 11/08/23 12:45 PM   Specimen: Nasal Mucosa; Nasal Swab  Result Value Ref Range Status   MRSA, PCR NEGATIVE NEGATIVE Final   Staphylococcus aureus NEGATIVE NEGATIVE Final    Comment: (NOTE) The Xpert SA Assay (FDA approved for NASAL specimens in patients 44 years of age and older), is one component of a comprehensive surveillance program. It is not intended to diagnose infection nor to guide or monitor treatment. Performed at [facility], [address], [place], [place] [address]     Procedures/Studies: CARDIAC CATHETERIZATION Result Date: 11/09/2023 Conclusions: Mild, non-obstructive coronary artery disease consistent with nonischemic cardiomyopathy. Normal left heart, right heart, and pulmonary artery pressures. Low normal to mildly reduced Fick cardiac output/index. Frequent PVC's noted throughout procedure. Recommendations: Hold IV furosemide; consider adding oral furosemide tomorrow based on renal function and volume status. Continue medical therapy and risk factor modification to prevent progression of coronary artery disease. Escalate goal-directed medical therapy at tolerated. Yvonne Kendall, MD [facility]  ECHOCARDIOGRAM  COMPLETE Result Date: 11/06/2023    ECHOCARDIOGRAM REPORT   Patient Name:   Amber Shaw Date of Exam: 11/06/2023 Medical Rec #:  [id number]     Height:       65.0 in Accession #:    [id number]    Weight:       178.6 lb Date of Birth:  1961-01-01     BSA:          1.885 m Patient Age:    63 years      BP:           160/84 mmHg Patient Gender: F             HR:           84 bpm. Exam Location:  Inpatient Procedure: 2D Echo (Both Spectral and Color Flow Doppler were utilized during            procedure). Indications:    acute diastolic chf  History:        Patient has no prior history of Echocardiogram examinations.                 COPD, Signs/Symptoms:Shortness of Breath; Risk Factors:Former                 Smoker and Hypertension.  Sonographer:    Delcie Roch RDCS Referring Phys: [id number] [facility] GOEL IMPRESSIONS  1. Left ventricular ejection fraction, by estimation, is 30%. The left ventricle has moderately decreased function. The left ventricle demonstrates global hypokinesis. There is mild concentric left ventricular hypertrophy. Left ventricular diastolic parameters are indeterminate.  2. Right ventricular systolic function is moderately reduced. The right ventricular size is normal. There is normal pulmonary artery systolic pressure.  3. Left atrial size was moderately dilated.  4. The mitral valve is normal in structure. Mild mitral valve regurgitation. No evidence of mitral stenosis.  5. The aortic valve is tricuspid. There is mild calcification of the aortic valve. Aortic valve regurgitation is not visualized. Aortic valve sclerosis/calcification is present, without any evidence of aortic stenosis.  6. The inferior vena cava is normal in size with greater than 50% respiratory variability, suggesting right atrial pressure of 3 mmHg. 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 FINDINGS  Left Ventricle: Left ventricular ejection  fraction, by estimation, is 30%. The left ventricle has moderately decreased function. The left ventricle demonstrates global hypokinesis. The left ventricular internal cavity size was normal in size. There is mild concentric left ventricular hypertrophy. Left ventricular diastolic parameters are indeterminate. Right Ventricle: The right ventricular size is normal. No increase in right ventricular wall thickness. Right ventricular systolic function is moderately reduced. There is normal pulmonary artery systolic pressure. The tricuspid regurgitant velocity is 2.58 m/s, and with an assumed right atrial pressure of 5 mmHg, the estimated right ventricular systolic pressure is 31.6 mmHg. Left Atrium: Left atrial size was moderately dilated. Right Atrium: Right atrial size was normal in size. Pericardium: There is no evidence of pericardial effusion. Mitral Valve: The mitral valve is normal in structure. Mild mitral valve regurgitation. No evidence of mitral valve stenosis. Tricuspid Valve: The tricuspid valve is normal in structure. Tricuspid valve regurgitation is mild . No evidence of tricuspid stenosis. Aortic Valve: The aortic valve is tricuspid. There is mild calcification of the aortic valve. Aortic valve regurgitation is not visualized. Aortic valve sclerosis/calcification is present, without any evidence of aortic stenosis. Pulmonic Valve: The pulmonic valve was normal in structure. Pulmonic valve regurgitation is not visualized. No evidence of pulmonic stenosis. Aorta: The aortic root is normal in size and structure. Venous: The inferior vena cava is normal in size with greater than 50% respiratory variability, suggesting right atrial pressure of 3 mmHg. IAS/Shunts: No atrial level shunt detected by color flow Doppler.  LEFT VENTRICLE PLAX 2D LVIDd:         5.30 cm      Diastology LVIDs:         4.60 cm      LV e' medial:  5.77 cm/s LV PW:         1.10 cm      LV e' lateral: 8.59 cm/s LV IVS:        1.00 cm  LVOT diam:     2.00 cm LVOT Area:     3.14 cm  LV Volumes (MOD) LV vol d, MOD A4C: 116.0 ml LV vol s, MOD A4C: 77.0 ml LV SV MOD A4C:     116.0 ml RIGHT VENTRICLE          IVC RV Basal diam:  2.60 cm  IVC diam: 1.50 cm TAPSE (M-mode): 2.0 cm LEFT ATRIUM             Index        RIGHT ATRIUM           Index LA diam:  3.80 cm 2.02 cm/m   RA Area:     15.50 cm LA Vol (A2C):   92.8 ml 49.23 ml/m  RA Volume:   36.00 ml  19.10 ml/m LA Vol (A4C):   67.1 ml 35.59 ml/m LA Biplane Vol: 80.6 ml 42.76 ml/m   AORTA Ao Root diam: 2.90 cm Ao Asc diam:  3.00 cm TRICUSPID VALVE TR Peak grad:   26.6 mmHg TR Vmax:        258.00 cm/s  SHUNTS Systemic Diam: 2.00 cm Arvilla Meres MD Electronically signed by Arvilla Meres MD Signature Date/Time: 11/06/2023/11:37:09 AM    Final    DG Chest 2 View Result Date: 11/05/2023 CLINICAL DATA:  COPD.  Shortness of breath. EXAM: CHEST - 2 VIEW COMPARISON:  11/23/2015 FINDINGS: The heart is enlarged. Mediastinal contours are normal. There is mild pulmonary edema. No significant pleural effusion. No focal airspace disease. No pneumothorax. Thoracic spondylosis. IMPRESSION: Cardiomegaly with mild pulmonary edema. Electronically Signed   By: Narda Rutherford M.D.   On: 11/05/2023 16:40     Time coordinating discharge: Over 30 minutes    Lewie Chamber, MD  [facility] Hospitalists 11/10/2023, 2:00 PM

## 2023-11-10 NOTE — Progress Notes (Addendum)
 Rounding Note    Patient Name: Amber Shaw Date of Encounter: 11/10/2023  [facility] Cardiologist: Rollene Rotunda, MD   Subjective   This am, she feels fine and denies SOB, CP, dizziness. She reports last night she felt very dizzy and indigestion associated with hypotension. She was given Malaox and felt relief. Able to walk around room without complaints.   Inpatient Medications    Scheduled Meds:  aspirin EC  81 mg Oral Daily   atorvastatin  40 mg Oral Daily   carvedilol  6.25 mg Oral BID WC   enoxaparin (LOVENOX) injection  40 mg Subcutaneous Q24H   fluticasone furoate-vilanterol  1 puff Inhalation Daily   pneumococcal 20-valent conjugate vaccine  0.5 mL Intramuscular Tomorrow-1000   sacubitril-valsartan  1 tablet Oral BID   sodium chloride flush  3 mL Intravenous Q12H   sodium chloride flush  3 mL Intravenous Q12H   Continuous Infusions:  sodium chloride     PRN Meds: sodium chloride, acetaminophen **OR** [DISCONTINUED] acetaminophen, albuterol, alum & mag hydroxide-simeth, polyethylene glycol, sodium chloride flush   Vital Signs    Vitals:   11/09/23 1659 11/09/23 1917 11/09/23 2325 11/10/23 0508  BP: (!) 151/82 (!) 117/55 (!) 127/92 98/68  Pulse: (!) 54 (!) 41 (!) 59 60  Resp: 16 18  15   Temp: 98.1 F (36.7 C) 98 F (36.7 C)  98.3 F (36.8 C)  TempSrc: Oral     SpO2: 98% 98% 98% 91%  Weight:      Height:        Intake/Output Summary (Last 24 hours) at 11/10/2023 0981 Last data filed at 11/09/2023 2130 Gross per 24 hour  Intake --  Output 850 ml  Net -850 ml      11/08/2023    5:00 AM 11/07/2023   10:00 AM 11/06/2023    5:00 AM  Last 3 Weights  Weight (lbs) 168 lb 14 oz 164 lb 1.6 oz 178 lb 9.2 oz  Weight (kg) 76.6 kg 74.435 kg 81 kg      Telemetry    NSR, HR 60's mixed with PAC and PVC - Personally Reviewed  ECG    None new - Personally Reviewed  Physical Exam   GEN: Laying in bed with No acute distress.  On room air. Neck:  No JVD Cardiac: RRR, no murmurs, rubs, or gallops.  Respiratory: Clear to auscultation bilaterally. GI: Soft, nontender, non-distended  MS: No edema; No deformity. Right radial access site w/o hematoma or abnormalities Neuro:  Nonfocal  Psych: Normal affect   Labs    High Sensitivity Troponin:   Recent Labs  Lab 11/05/23 1805 11/06/23 0820  TROPONINIHS 30* 34*     Chemistry Recent Labs  Lab 11/06/23 0537 11/07/23 0643 11/08/23 0531 11/09/23 0453 11/09/23 1343 11/09/23 1345 11/10/23 0531  NA 139   < > 142 142 142 142 137  K 3.5   < > 3.7 4.0 3.4* 3.5 3.3*  CL 99   < > 101 102  --   --  97*  CO2 32   < > 31 32  --   --  30  GLUCOSE 100*   < > 109* 96  --   --  113*  BUN 9   < > 19 22  --   --  23  CREATININE 0.72   < > 0.80 0.82  --   --  0.76  CALCIUM 8.7*   < > 8.9 8.7*  --   --  8.7*  MG  --    < > 2.0 2.1  --   --  2.2  PROT 6.2*  --  6.6  --   --   --   --   ALBUMIN 3.4*  --  3.4*  --   --   --   --   AST 42*  --  17  --   --   --   --   ALT 79*  --  44  --   --   --   --   ALKPHOS 54  --  58  --   --   --   --   BILITOT 0.9  --  1.0  --   --   --   --   GFRNONAA >60   < > >60 >60  --   --  >60  ANIONGAP 8   < > 10 8  --   --  10   < > = values in this interval not displayed.    Lipids  Recent Labs  Lab 11/06/23 0533  CHOL 154  TRIG 102  HDL 45  LDLCALC 89  CHOLHDL 3.4    Hematology Recent Labs  Lab 11/08/23 0531 11/09/23 0453 11/09/23 1343 11/09/23 1345 11/10/23 0531  WBC 4.3 4.2  --   --  4.3  RBC 5.15* 5.30*  --   --  5.15*  HGB 15.1* 15.5* 16.0* 15.6* 15.2*  HCT 47.8* 49.5* 47.0* 46.0 47.5*  MCV 92.8 93.4  --   --  92.2  MCH 29.3 29.2  --   --  29.5  MCHC 31.6 31.3  --   --  32.0  RDW 14.6 14.7  --   --  14.6  PLT 241 242  --   --  237   Thyroid  Recent Labs  Lab 11/07/23 1126  TSH 0.850    BNP Recent Labs  Lab 11/05/23 1334  BNP 1,351.2*    DDimer No results for input(s): "DDIMER" in the last 168 hours.   Radiology     CARDIAC CATHETERIZATION Result Date: 11/09/2023 Conclusions: Mild, non-obstructive coronary artery disease consistent with nonischemic cardiomyopathy. Normal left heart, right heart, and pulmonary artery pressures. Low normal to mildly reduced Fick cardiac output/index. Frequent PVC's noted throughout procedure. Recommendations: Hold IV furosemide; consider adding oral furosemide tomorrow based on renal function and volume status. Continue medical therapy and risk factor modification to prevent progression of coronary artery disease. Escalate goal-directed medical therapy at tolerated. Yvonne Kendall, MD [facility]   Cardiac Studies   ECHO IMPRESSIONS 11/06/23  1. Left ventricular ejection fraction, by estimation, is 30%. The left  ventricle has moderately decreased function. The left ventricle  demonstrates global hypokinesis. There is mild concentric left ventricular  hypertrophy. Left ventricular diastolic  parameters are indeterminate.   2. Right ventricular systolic function is moderately reduced. The right  ventricular size is normal. There is normal pulmonary artery systolic  pressure.   3. Left atrial size was moderately dilated.   4. The mitral valve is normal in structure. Mild mitral valve  regurgitation. No evidence of mitral stenosis.   5. The aortic valve is tricuspid. There is mild calcification of the  aortic valve. Aortic valve regurgitation is not visualized. Aortic valve  sclerosis/calcification is present, without any evidence of aortic  stenosis.   6. The inferior vena cava is normal in size with greater than 50%  respiratory variability, suggesting right atrial pressure of 3 mmHg.  Conclusion(s)/Recommendation(s): Frequent PVCs during study. 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   Cath 11/09/2023 Diagnostic Dominance: Right  Flowsheet Row Most Recent Value  Fick Cardiac Output 4.16 L/min  Fick Cardiac Output Index 2.25  (L/min)/BSA  RA A Wave 5 mmHg  RA V Wave 4 mmHg  RA Mean 2 mmHg  RV Systolic Pressure 24 mmHg  RV Diastolic Pressure 0 mmHg  RV EDP 4 mmHg  PA Systolic Pressure 27 mmHg  PA Diastolic Pressure 8 mmHg  PA Mean 16 mmHg  PW A Wave 15 mmHg  PW V Wave 21 mmHg  PW Mean 12 mmHg  AO Systolic Pressure 127 mmHg  AO Diastolic Pressure 99 mmHg  AO Mean 94 mmHg  LV Systolic Pressure 114 mmHg  LV Diastolic Pressure 2 mmHg  LV EDP 14 mmHg  Arterial Occlusion Pressure Extended Systolic Pressure 127 mmHg  Arterial Occlusion Pressure Extended Diastolic Pressure 83 mmHg  Arterial Occlusion Pressure Extended Mean Pressure 85 mmHg  Left Ventricular Apex Extended Systolic Pressure 121 mmHg  LVp Diastolic Pressure 2 mmHg  Left Ventricular Apex Extended EDP Pressure 28 mmHg  QP/QS 1  TPVR Index 7.1 HRUI  TSVR Index 41.71 HRUI  PVR SVR Ratio 0.04  TPVR/TSVR Ratio 0.17    Conclusions: Mild, non-obstructive coronary artery disease consistent with nonischemic cardiomyopathy. Normal left heart, right heart, and pulmonary artery pressures. Low normal to mildly reduced Fick cardiac output/index. Frequent PVC's noted throughout procedure.   Recommendations: Hold IV furosemide; consider adding oral furosemide tomorrow based on renal function and volume status. Continue medical therapy and risk factor modification to prevent progression of coronary artery disease. Escalate goal-directed medical therapy at tolerated.   Patient Profile     63 y.o. female  with pmh of HTN, COPD, sleep apnea, former  tobacco use, arthritis who presents with edema and SOB then found to have new onset HFrEF, EF 30%.   Assessment & Plan    63 y.o. female  with pmh of HTN, COPD, sleep apnea, former  tobacco use, arthritis who presents with edema and SOB then found to have new onset HFrEF, EF 30%.   Assessment & Plan    New onset acute on HFrEF  Presented with LE edema, SOB, PND, orthopnea. BNP 1351.Marland Kitchen CXR with  cardiomegaly and mild pulmonary edema. ECHO reveals LVEF 30%, LV moderately decreased function, LV global hypokinesis, mild concentric LVH, RV systolic function moderately reduced , LA moderately dilated, mild MVR, mild calcification of aorta. ECHO also noted frequent PVC burden. TSH WNL, A1C 5.8% - net I/O: -3501, wt 176 > 168, Cr 0.76, reports good urine output, PE: trace edema, diminished lung bases - D/C IV lasix 40 mg, switch to PO Torsemide 20 mg  - monitor I/O, weight and renal functions  - GDMT:  Entresto restarted and Coreg increased yesterday with BP on the soft side. Pt reported dizziness during this time. Denis any dizziness now. Recheck BP 104/47. Continue Entresto 24-26 and decrease coreg to 3.125 during admission. - Follow up in outpatient to add spironolactone as tolerated. Per med cost review, [name] and Marcelline Deist is not covered with her insurance. Will d/c with Valsartan 40 mg and Coreg 3.125  - L/RHC 11/09/23: Mild, non-obstructive CAD consistent with nonischemic cardiomyopathy. Normal left heart, right heart, and pulmonary artery pressures. Low normal to mildly reduced Fick cardiac output/index. Frequent PVC's noted throughout procedure. - this am, reports denies any complaints. Able to walk in room without any concerns or O2.  -  Educated on dietary sodium restriction, daily weight monitoring, s/s of HF exacerbation, and limitations with radial access site.  - Scheduled HF TOC appointment 11/17/23 @ 9:30 am.    Mild MR  -  see echo above  -  will follow clinically    Ventricular bigeminy  - ECG back 2014 show frequent PVC's.  - Family history for unknown cause for sudden cardiac death including father, sister and maternal aunt. 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 Will plan for outpatient cardiac MRI.   - K 3.3, Mg 2.2, TSH WNL. Added KCL supplement this am.  - Telemetry this am: NSR, ,mixed PVCs and PAC's, HR 60's  - improvement in PVC burden since admission  - this am,  denies any other complaints - currently on Coreg 6.25, decrease to 3.125.   HTN  - Rechecked BP in room 104/47 - being managed by HF treatment. See above.    HLD Former Tobacco use - LDL 89 - Continue home med: Lipitor 40 mg, ASA 81 mg    Sleep Apnea - denied CPAP last night, prefer her own but will continue to use once home.    Otherwise managed per primary - COPD    [facility] will sign off.   Medication Recommendations:  valsartan 40 mg daily (unable to do Entresto as she is in the process of applying for Medicaid; valsartan is free for her), torsemide 20 mg daily, carvedilol 3.125 mg twice daily.   Other recommendations (labs, testing, etc): BMET/magnesium in 1 week.  She is applying for Medicaid, would plan cardiac MRI and Zio patch to quantify PVC burden once insurance issues resolved Follow up as an outpatient: Scheduled for 11/17/2023   For questions or updates, please contact [facility] Please consult [email or web address] for contact info under      Signed, Basilio Cairo, PA-C  11/10/2023, 7:12 AM     Patient seen and examined.  Agree with above documentation.  On exam, patient is alert and oriented, regular rate and rhythm, no murmurs, lungs CTAB, no LE edema or JVD.  She presented with shortness of breath and lower extremity edema.  Echocardiogram showed EF 30%, moderately reduced RV function.  She was started on IV diuretics.  LHC showed mild nonobstructive CAD, RHC with normal filling pressures (RA 2, RV 24/0, PA 27/8/16, PCWP 12, LVEDP 14, CI 2.3).  On exam, patient is alert and oriented, regular rate and rhythm, no murmurs, lungs CTAB, no LE edema or JVD.  For her acute combined heart failure, she appears euvolemic.  Unclear cause of nonischemic cardiomyopathy.  Will plan cardiac MRI as outpatient as well as Zio patch to quantify PVC burden once her insurance issues are resolved (she has [place] but is applying for [facility]).   Will discharge on valsartan, carvedilol, torsemide.  Schedule follow-up in 1 week.  Little Ishikawa, MD

## 2023-11-10 NOTE — Discharge Instructions (Signed)
 If blood pressure becomes low around 90/60 or heart rate less than 40 or if you feel dizzy, lightheaded then skip your dose of carvedilol and valsartan  Keep blood pressure journal daily and bring to follow up appointment with cardiology

## 2023-11-16 ENCOUNTER — Telehealth (HOSPITAL_COMMUNITY): Payer: Self-pay

## 2023-11-16 ENCOUNTER — Other Ambulatory Visit (HOSPITAL_COMMUNITY): Payer: Self-pay

## 2023-11-16 NOTE — Progress Notes (Signed)
 HEART & VASCULAR TRANSITION OF CARE CONSULT NOTE     Referring Physician: Patient, [name]   Chief Complaint: Post hospital f/u for systolic heart failure   HPI: Referred to clinic by Dr. Milus Mallick for heart failure consultation.   63 y/o AAF w/ h/o CODP, HTN, OSA compliant w/ CPAP and FH of SCD (father in late 65s, sister late 2s), who was recently admitted at [facility] for progressive SOB and diagnosed w/ new CHF. CXR w/ vascular congestion. BNP 1,351. Hs trop 30>>34. EKG and tele showed frequent PVCs. Echo showed reduced LVEF 30%, global HK, mild LVH and moderately reduced RV. She was diuresed w/ IV lasix and sent to [facility] for diagnostic R/LHC. Study demonstrated NICM, angiography w/ non-obstructive coronary artery disease. RHC hemodynamics c/w Normal left heart, right heart, and pulmonary artery pressures and low normal to mildly reduced Fick cardiac output/index. Noted to have frequent PVCs during case. She was transitioned to PO torsemide and GDMT w/ ARB +? blocker. Referred to TOC at discharge.   She presents today for post hospital f/u. Overall feeling better, symptomatically improved. Able to do basic ADLs around the house w/o significant exertional dyspnea but hasn't tried to over exert herself. Reports full med compliance. Tolerating well w/o side effects. Wt has been stable at home. No LEE, orthopnea/PND. BP well controlled 118/64. EKG today shows NSR w/ PVCs 77 bpm.   She reports being diagnosed w/ OSA about a year ago while she was living up in [place]. She reports full nightly compliance w/ CPAP.   She has applied for [place] medicaid. Application pending.    Cardiac Testing   2D Echo   1. Left ventricular ejection fraction, by estimation, is 30%. The left  ventricle has moderately decreased function. The left ventricle  demonstrates global hypokinesis. There is mild concentric left ventricular  hypertrophy. Left ventricular diastolic  parameters are indeterminate.   2. Right  ventricular systolic function is moderately reduced. The right  ventricular size is normal. There is normal pulmonary artery systolic  pressure.   3. Left atrial size was moderately dilated.   4. The mitral valve is normal in structure. Mild mitral valve  regurgitation. No evidence of mitral stenosis.   5. The aortic valve is tricuspid. There is mild calcification of the  aortic valve. Aortic valve regurgitation is not visualized. Aortic valve  sclerosis/calcification is present, without any evidence of aortic  stenosis.   6. The inferior vena cava is normal in size with greater than 50%  respiratory variability, suggesting right atrial pressure of 3 mmHg.    R/LHC 11/09/23 Conclusions: Mild, non-obstructive coronary artery disease consistent with nonischemic cardiomyopathy. Normal left heart, right heart, and pulmonary artery pressures. Low normal to mildly reduced Fick cardiac output/index. Frequent PVC's noted throughout procedure.  Right Heart Pressures All measurements reported at end-expiration.  Accuracy affected by frequent PVC's.  RA (mean): 4 mmHg RV (S/EDP): 34/4 mmHg PA (S/D, mean): 28/10 (16) mmHg PCWP (mean): 13 mmHg  Ao sat: 94% PA sat: 66%  Fick CO: 4.2 L/min Fick CI: 2.3 L/min/m^2     Recommendations: Hold IV furosemide; consider adding oral furosemide tomorrow based on renal function and volume status. Continue medical therapy and risk factor modification to prevent progression of coronary artery disease. Escalate goal-directed medical therapy at tolerated.   Past Medical History:  Diagnosis Date   Arthritis    Asthma    COPD (chronic obstructive pulmonary disease) (HCC)    Hypertension  Sleep apnea     Current Outpatient Medications  Medication Sig Dispense Refill   albuterol (VENTOLIN HFA) 108 (90 Base) MCG/ACT inhaler Inhale 2 puffs into the lungs every 4 (four) hours as needed for wheezing or shortness of breath. 18 g 0   aspirin EC 81 MG  tablet Take 1 tablet (81 mg total) by mouth daily. Swallow whole. 90 tablet 3   atorvastatin (LIPITOR) 40 MG tablet Take 1 tablet (40 mg total) by mouth daily. 90 tablet 3   carvedilol (COREG) 3.125 MG tablet Take 1 tablet (3.125 mg total) by mouth 2 (two) times daily with a meal. 180 tablet 3   fluticasone-salmeterol (ADVAIR DISKUS) 250-50 MCG/ACT AEPB Inhale 1 puff into the lungs in the morning and at bedtime. 60 each 3   spironolactone (ALDACTONE) 25 MG tablet Take 0.5 tablets (12.5 mg total) by mouth daily. 45 tablet 3   torsemide (DEMADEX) 20 MG tablet Take 1 tablet (20 mg total) by mouth daily. 90 tablet 3   valsartan (DIOVAN) 40 MG tablet Take 1 tablet (40 mg total) by mouth daily. 90 tablet 3   potassium chloride (KLOR-CON) 8 MEQ tablet Take 2 tablets (16 mEq total) by mouth 2 (two) times daily for 7 days. (Patient not taking: Reported on 11/17/2023) 28 tablet 0   No current facility-administered medications for this encounter.    Allergies  Allergen Reactions   Percocet [Oxycodone-Acetaminophen] Other (See Comments)    Painful urination       Social History   Socioeconomic History   Marital status: Single    Spouse name: Not on file   Number of children: 2   Years of education: Not on file   Highest education level: High school graduate  Occupational History   Occupation: Not working  Tobacco Use   Smoking status: Former    Current packs/day: 0.00    Average packs/day: 0.3 packs/day for 18.0 years (4.5 ttl pk-yrs)    Types: Cigarettes    Start date: 11/12/1996    Quit date: 11/13/2014    Years since quitting: 9.0   Smokeless tobacco: Never  Vaping Use   Vaping status: Former   Substances: Flavoring  Substance and Sexual Activity   Alcohol use: No    Alcohol/week: 0.0 standard drinks of alcohol   Drug use: No   Sexual activity: Not on file  Other Topics Concern   Not on file  Social History Narrative   Lives alone.     Two children.     Social Drivers of Manufacturing engineer Strain: Low Risk  (11/10/2023)   Overall Financial Resource Strain (CARDIA)    Difficulty of Paying Living Expenses: Not very hard  Food Insecurity: No Food Insecurity (11/05/2023)   Hunger Vital Sign    Worried About Running Out of Food in the Last Year: Never true    Ran Out of Food in the Last Year: Never true  Transportation Needs: No Transportation Needs (11/10/2023)   PRAPARE - Administrator, Civil Service (Medical): No    Lack of Transportation (Non-Medical): No  Physical Activity: Not on file  Stress: Not on file  Social Connections: Not on file  Intimate Partner Violence: Not At Risk (11/05/2023)   Humiliation, Afraid, Rape, and Kick questionnaire    Fear of Current or Ex-Partner: No    Emotionally Abused: No    Physically Abused: No    Sexually Abused: No      Family History  Problem Relation Age of Onset   Cancer Mother    Hypertension Father    Heart disease Father        "Rare heart disease"  Died age 103   Colon cancer Maternal Grandfather 48   Cancer Other    Diabetes Other    Hyperlipidemia Other    Stroke Other     Vitals:   11/17/23 0932  BP: 118/64  Pulse: (!) 55  SpO2: 97%  Weight: 74.5 kg (164 lb 3.2 oz)  Height: 5\' 5"  (1.651 m)    PHYSICAL EXAM: General:  Well appearing. No respiratory difficulty HEENT: normal Neck: supple. no JVD. Carotids 2+ bilat; no bruits. No lymphadenopathy or thryomegaly appreciated. Cor: PMI nondisplaced. Regular rate & rhythm. No rubs, gallops or murmurs. Lungs: clear Abdomen: soft, nontender, nondistended. No hepatosplenomegaly. No bruits or masses. Good bowel sounds. Extremities: no cyanosis, clubbing, rash, edema Neuro: alert & oriented x 3, cranial nerves grossly intact. moves all 4 extremities w/o difficulty. Affect pleasant.  ECG: NSR w/ PVCs 77 bpm    ASSESSMENT & PLAN:  1. Chronic Systolic Heart Failure - Echo 1/61 EF 30%, global HK, mild LVH and moderately reduced RV -  NICM. LHC 3/25 nonobstructive CAD - Etiology uncertain. ? PVC mediated CM vs infiltrative vs Familial (father + sister w/ SCD at young ages) - NYHA Class II. Volume ok. Euvolemic on exam   - Continue Valsartan 40 mg daily. Would like to get on Entresto eventually, pending insurance coverage   - Continue Coreg 3.125 mg bid  - Add Spiro 12.5 mg daily  - SGLT2i next visit  - Continue Torsemide 20 mg daily. BMP/BNP today  - Assign to [facility] for further treatment and monitoring for future advanced therapies  - Plan cMRI - send genetic testing - 2 wk Zio to assess PVC burden  - will need repeat echo 3 months after GDMT optimized. If EF < 35% will need ICD. Narrow QRS. Not CRT candidate   2. Frequent PVCs - noted recent hospitalization. ? If contributing to CM of result of CM - cath showed no significant CAD. Recent TSH, Mg and K have been stable  - place 2wk Zio. If high burden, will need suppression w/ AAD therapy  - She has underlying OSA but compliant w/ CPAP   3. Hypertension - controlled on current regimen - GDMT per above - BMP today     Referred to HFSW (PCP, Medications, Transportation, ETOH Abuse, Drug Abuse, Insurance, Financial ): Yes, will need f/u on insurance application for [place] medicaid  Refer to Pharmacy: Yes  Refer to Home Health:  No Refer to [facility]: Yes (assign to Dr. Gasper Lloyd)  Refer to General Cardiology:  No  Follow up  w/ Dr. Gasper Lloyd in 2-3 wks.   Robbie Lis, PA-C 11/17/2023

## 2023-11-16 NOTE — Telephone Encounter (Signed)
 Called to confirm/remind patient of their appointment at the [facility] on 11/17/2023 9:30.   Appointment:   [x] Confirmed  [] Left mess   [] No answer/No voice mail  [] Phone not in service  Patient reminded to bring all medications and/or complete list.  Confirmed patient has transportation. Gave directions, instructed to utilize valet parking.

## 2023-11-16 NOTE — Progress Notes (Signed)
   [facility] Trans[facility] Clinic Heart Failure Pharmacist Encounter  PCP: Patient, [name] PCP-Cardiologist: Rollene Rotunda, MD  HPI:  63 yo F with PMH of COPD, sleep apnea, HTN, family history of heart disease, former tobacco use, and arthritis.   She presented to the ED on 3/13 with shortness of breath, orthopnea, and denied LE edema. CXR with cardiomegaly and mild pulmonary edema. BNP 1351. Started on IV lasix. ECHO 3/14 with LVEF 30%, global hypokinesis, mild concentric LVH, RV moderately reduced, mild MR, RA pressure 3 mmHg. Started on Entresto and carvedilol during admission but [name] was held on 3/16 due to hypotension. Since she was asymptomatic, this was restarted on 3/17. Underwent R/LHC on 3/17 and found to have mild non-obstructive CAD. RA 2, PA 16, wedge 12, CO 4.16, CI 2.25. IV lasix was discontinued. She was discharged home on 3/18 with torsemide 20 mg daily, carvedilol 3.125 mg BID, and valsartan 40 mg daily. Of note, she still has [place] and has not yet transitioned to [facility] yet.   Today, Amber Shaw presents to the Heart Failure TOC Clinic for follow up.   Shortness of breath/dyspnea on exertion? {YES NO:22349}  Orthopnea/PND? {YES NO:22349} Edema? {YES NO:22349} Lightheadedness/dizziness? {YES NO:22349} Daily weights at home? {YES NO:22349} Blood pressure/heart rate monitoring at home? {YES [id number] Following low-sodium/fluid-restricted diet? {YES NO:22349} Taking medications as prescribed? {YES NO:22349}  HF Medications: Diuretic: torsemide 20 mg daily Beta Blocker: carvedilol 3.125 mg BID ACE/ARB/ARNI: valsartan 40 mg daily  Has the patient been experiencing any side effects to the medications prescribed?  {YES NO:22349}  Does the patient have any problems obtaining medications due to transportation or finances?   {YES NO:22349}  Understanding of regimen: {excellent/good/fair/poor:19665} Understanding of indications:  {excellent/good/fair/poor:19665} Potential of compliance: {excellent/good/fair/poor:19665} Patient understands to avoid NSAIDs. Patient understands to avoid decongestants.   Pertinent Lab Values: Serum creatinine ***, BUN ***, Potassium ***, Sodium ***, BNP ***, Magnesium ***   Vital Signs: Weight: *** lbs (discharge weight: 168 lbs) Blood pressure: ***  Heart rate: ***   Medication Assistance / Insurance Benefits Check: Does the patient have prescription insurance?  Yes Type of insurance plan: [place] Medicaid - working on transitioning to [facility] Medicaid  Outpatient Pharmacy:  Current outpatient pharmacy: [facility] Was the [facility] pharmacy used to supply discharge medications? no   Assessment: 1) Chronic ***systolic CHF (EF ***), due to ***. NYHA class *** symptoms. -   Plan: 1) Medication changes: -   2) Patient Assistance: -  3) Follow up: - Next appointment with *** on ***  Sharen Hones, PharmD, BCPS Heart Failure Transitions of Care Clinic Pharmacist [phone]

## 2023-11-17 ENCOUNTER — Ambulatory Visit (HOSPITAL_COMMUNITY)
Admission: RE | Admit: 2023-11-17 | Discharge: 2023-11-17 | Disposition: A | Discharge status: Home / Self Care | Source: Ambulatory Visit | Attending: Cardiology | Admitting: Cardiology

## 2023-11-17 ENCOUNTER — Other Ambulatory Visit (HOSPITAL_COMMUNITY): Payer: Self-pay

## 2023-11-17 ENCOUNTER — Encounter (HOSPITAL_COMMUNITY): Payer: Self-pay

## 2023-11-17 ENCOUNTER — Ambulatory Visit (HOSPITAL_COMMUNITY)
Admit: 2023-11-17 | Discharge: 2023-11-17 | Disposition: A | Discharge status: Home / Self Care | Source: Ambulatory Visit | Attending: Cardiology | Admitting: Cardiology

## 2023-11-17 ENCOUNTER — Other Ambulatory Visit (HOSPITAL_COMMUNITY): Payer: Self-pay | Admitting: Cardiology

## 2023-11-17 ENCOUNTER — Other Ambulatory Visit: Payer: Self-pay

## 2023-11-17 VITALS — BP 118/64 | HR 55 | Ht 65.0 in | Wt 164.2 lb

## 2023-11-17 DIAGNOSIS — I493 Ventricular premature depolarization: Secondary | ICD-10-CM | POA: Diagnosis not present

## 2023-11-17 DIAGNOSIS — Z8249 Family history of ischemic heart disease and other diseases of the circulatory system: Secondary | ICD-10-CM | POA: Insufficient documentation

## 2023-11-17 DIAGNOSIS — I5022 Chronic systolic (congestive) heart failure: Secondary | ICD-10-CM | POA: Diagnosis not present

## 2023-11-17 DIAGNOSIS — I110 Hypertensive heart disease with heart failure: Secondary | ICD-10-CM | POA: Insufficient documentation

## 2023-11-17 DIAGNOSIS — I428 Other cardiomyopathies: Secondary | ICD-10-CM | POA: Insufficient documentation

## 2023-11-17 DIAGNOSIS — G4733 Obstructive sleep apnea (adult) (pediatric): Secondary | ICD-10-CM | POA: Insufficient documentation

## 2023-11-17 DIAGNOSIS — I5041 Acute combined systolic (congestive) and diastolic (congestive) heart failure: Secondary | ICD-10-CM | POA: Diagnosis not present

## 2023-11-17 DIAGNOSIS — Z87891 Personal history of nicotine dependence: Secondary | ICD-10-CM | POA: Insufficient documentation

## 2023-11-17 DIAGNOSIS — I2510 Atherosclerotic heart disease of native coronary artery without angina pectoris: Secondary | ICD-10-CM | POA: Diagnosis not present

## 2023-11-17 DIAGNOSIS — J449 Chronic obstructive pulmonary disease, unspecified: Secondary | ICD-10-CM | POA: Insufficient documentation

## 2023-11-17 DIAGNOSIS — M069 Rheumatoid arthritis, unspecified: Secondary | ICD-10-CM | POA: Diagnosis not present

## 2023-11-17 LAB — BASIC METABOLIC PANEL
Anion gap: 11 (ref 5–15)
Anion gap: 13 (ref 5–15)
BUN: 26 mg/dL — ABNORMAL HIGH (ref 8–23)
BUN: 25 mg/dL — ABNORMAL HIGH (ref 8–23)
CO2: 33 mmol/L — ABNORMAL HIGH (ref 22–32)
CO2: 31 mmol/L (ref 22–32)
Calcium: 10.8 mg/dL — ABNORMAL HIGH (ref 8.9–10.3)
Calcium: 10.1 mg/dL (ref 8.9–10.3)
Chloride: 102 mmol/L (ref 98–111)
Chloride: 98 mmol/L (ref 98–111)
Creatinine, Ser: 1.05 mg/dL — ABNORMAL HIGH (ref 0.44–1.00)
Creatinine, Ser: 0.93 mg/dL (ref 0.44–1.00)
GFR, Estimated: >60 mL/min (ref >60)
GFR, Estimated: >60 mL/min (ref >60)
Glucose, Bld: 80 mg/dL (ref 70–99)
Glucose, Bld: 82 mg/dL (ref 70–99)
Potassium: 5.8 mmol/L — ABNORMAL HIGH (ref 3.5–5.1)
Potassium: 4.8 mmol/L (ref 3.5–5.1)
Sodium: 146 mmol/L — ABNORMAL HIGH (ref 135–145)
Sodium: 142 mmol/L (ref 135–145)

## 2023-11-17 LAB — BRAIN NATRIURETIC PEPTIDE: B Natriuretic Peptide: 178.3 pg/mL — ABNORMAL HIGH (ref 0.0–100.0)

## 2023-11-17 MED ORDER — SPIRONOLACTONE 25 MG PO TABS
12.5000 mg | ORAL_TABLET | Freq: Every day | ORAL | 3 refills | Status: DC
  Filled 2023-11-17: qty 45, 90d supply, fill #0
  Filled 2024-01-26: qty 45, 90d supply, fill #1

## 2023-11-17 NOTE — Patient Instructions (Addendum)
 Medication Changes:  START: SPIRONOLACTONE 12.5MG  ONCE DAILY   Lab Work:  Labs done today, your results will be available in MyChart, we will contact you for abnormal readings.  Genetic testing has been collected, this has to be sent to [facility] for processing and can take 1-2 weeks for [place] to get results back.  We will let you know the results once reviewed by your provider.  Testing/Procedures:  Your provider has recommended that  you wear a Zio Patch for 14 days.  This monitor will record your heart rhythm for our review.  IF you have any symptoms while wearing the monitor please press the button.  If you have any issues with the patch or you notice a red or orange light on it please call the company at [phone].  Once you remove the patch please mail it back to the company as soon as possible so we can get the results.  SOMEONE WILL CALL YOU TO GET YOU SCHEDULED FOR THIS ONCE APPROVED WITH INSURANCE [address] [place], [place] [address] Please take advantage of the free valet parking available at the [facility] and [employer] (Entrance C).  Proceed to the [facility] Radiology Department (First Floor) for check-in.   Magnetic resonance imaging (MRI) is a painless test that produces images of the inside of the body without using Xrays.  During an MRI, strong magnets and radio waves work together in a Data processing manager to form detailed images.   MRI images may provide more details about a medical condition than X-rays, CT scans, and ultrasounds can provide.  You may be given earphones to listen for instructions.  You may eat a light breakfast and take medications as ordered with the exception of furosemide, hydrochlorothiazide, chlorthalidone or spironolactone (or any other fluid pill). If you are undergoing a stress MRI, please avoid stimulants for 12 hr prior to test. (I.e. Caffeine, nicotine, chocolate, or antihistamine medications)  If your provider has ordered  anti-anxiety medications for this test, then you will need a driver.  An IV will be inserted into one of your veins. Contrast material will be injected into your IV. It will leave your body through your urine within a day. You may be told to drink plenty of fluids to help flush the contrast material out of your system.  You will be asked to remove all metal, including: Watch, jewelry, and other metal objects including hearing aids, hair pieces and dentures. Also wearable glucose monitoring systems (ie. Freestyle Libre and Omnipods) (Braces and fillings normally are not a problem.)   TEST WILL TAKE APPROXIMATELY 1 HOUR  PLEASE NOTIFY SCHEDULING AT LEAST 24 HOURS IN ADVANCE IF YOU ARE UNABLE TO KEEP YOUR APPOINTMENT. [phone]  For more information and frequently asked questions, please visit our website : [email or web address]  Please call the Cardiac Imaging Nurse Navigators with any questions/concerns. [phone] Office    Follow-Up in: AS SCHEDULED WITH DR. Gasper Lloyd   At the Advanced Heart Failure Clinic, you and your health needs are our priority. We have a designated team specialized in the treatment of Heart Failure. This Care Team includes your primary Heart Failure Specialized Cardiologist (physician), Advanced Practice Providers (APPs- Physician Assistants and Nurse Practitioners), and Pharmacist who all work together to provide you with the care you need, when you need it.   You may see any of the following providers on your designated Care Team at your next follow up:  Dr. Arvilla Meres Dr. Marca Ancona Dr. Dorthula Nettles Dr. Romeo Apple  Lucretia Field, NP Robbie Lis, PA [facility] [place], [place] Brynda Peon, NP [place] Lee, NP Karle Plumber, PharmD   Please be sure to bring in all your medications bottles to every appointment.   Need to Contact us:  If you have any questions or concerns before your next appointment please send [place] a  message through [facility] or call our office at [phone].    TO LEAVE A MESSAGE FOR THE NURSE SELECT OPTION 2, PLEASE LEAVE A MESSAGE INCLUDING: YOUR NAME DATE OF BIRTH CALL BACK NUMBER REASON FOR CALL**this is important as we prioritize the call backs  YOU WILL RECEIVE A CALL BACK THE SAME DAY AS LONG AS YOU CALL BEFORE 4:00 PM

## 2023-11-17 NOTE — Progress Notes (Signed)
 Zio patch placed onto patient.  All instructions and information reviewed with patient, they verbalize understanding with no questions.   CARDIOMYOPATHY genetic testing collected via BLOOD SAMPLE per BRITTAINY SIMMONS PA-C.  Order form completed, signed and shipped with sample by FedEx to [facility].

## 2023-11-18 ENCOUNTER — Other Ambulatory Visit: Payer: Self-pay

## 2023-11-18 ENCOUNTER — Telehealth (HOSPITAL_COMMUNITY): Payer: Self-pay

## 2023-11-18 DIAGNOSIS — I5042 Chronic combined systolic (congestive) and diastolic (congestive) heart failure: Secondary | ICD-10-CM

## 2023-11-18 NOTE — Telephone Encounter (Signed)
 Patient's labs has been placed and appointment scheduled. In addition, pt will pick up spiro medication from pharmacy. Pt aware, agreeable, and verbalized understanding.

## 2023-11-18 NOTE — Telephone Encounter (Signed)
-----   Message from [place] sent at 11/17/2023  4:26 PM EDT ----- Repeat labs are normal. Will plan careful trial of spironolactone 12.5 mg daily. Return for f/u BMP on Monday. Avoid K rich foods.

## 2023-11-24 ENCOUNTER — Ambulatory Visit (HOSPITAL_COMMUNITY)
Admission: RE | Admit: 2023-11-24 | Discharge: 2023-11-24 | Disposition: A | Discharge status: Home / Self Care | Source: Ambulatory Visit | Attending: Internal Medicine | Admitting: Internal Medicine

## 2023-11-24 DIAGNOSIS — I5042 Chronic combined systolic (congestive) and diastolic (congestive) heart failure: Secondary | ICD-10-CM | POA: Diagnosis not present

## 2023-11-24 LAB — BASIC METABOLIC PANEL WITH GFR
Anion gap: 10 (ref 5–15)
BUN: 22 mg/dL (ref 8–23)
CO2: 30 mmol/L (ref 22–32)
Calcium: 9.5 mg/dL (ref 8.9–10.3)
Chloride: 102 mmol/L (ref 98–111)
Creatinine, Ser: 0.87 mg/dL (ref 0.44–1.00)
GFR, Estimated: >60 mL/min
Glucose, Bld: 104 mg/dL — ABNORMAL HIGH (ref 70–99)
Potassium: 3.7 mmol/L (ref 3.5–5.1)
Sodium: 142 mmol/L (ref 135–145)

## 2023-12-05 DIAGNOSIS — Z419 Encounter for procedure for purposes other than remedying health state, unspecified: Secondary | ICD-10-CM | POA: Diagnosis not present

## 2023-12-07 NOTE — Progress Notes (Signed)
   ADVANCED HEART FAILURE CLINIC NOTE  Referring Physician: No ref. provider found  Primary Care: Patient, [name] Primary Cardiologist: Dr. Alda Amas  CC: Heart failure with reduced ejection fraction  HPI: Tarita Deshmukh is a 63 y.o. female with systolic heart failure, COPD, hypertension, obstructive sleep apnea on CPAP, family history of sudden cardiac death (father in 66s, sister in late 35s) presenting today to establish care.  Her cardiac history dates back to March 2018 when she was admitted to [facility] with shortness of breath; echocardiogram with LVEF of 30% at that time with moderately reduced RV function.  Patient was diuresed and sent to [facility] where right and left heart catheterization demonstrated nonobstructive CAD and right heart cath with normal filling pressures and cardiac index of 2.3 L/min/m.  During admission and procedures patient was noted to have frequent PVCs.  Interval history Since discharge from the hospital, patient has been seen in [facility] clinic where GDMT was added on and uptitrated.   Activity level/exercise tolerance:  *** Orthopnea:  Sleeps on *** pillows Paroxysmal noctural dyspnea:  *** Chest pain/pressure:  *** Orthostatic lightheadedness:  *** Palpitations:  *** Lower extremity edema:  *** Presyncope/syncope:  *** Cough:  ***  Pertinent Family & social hx:    PHYSICAL EXAM: There were no vitals filed for this visit. GENERAL: NAD Lungs- *** CARDIAC:  JVP: *** cm          Normal rate with regular rhythm. *** murmur.  Pulses ***. *** edema.  ABDOMEN: Soft, non-tender, non-distended.  EXTREMITIES: Warm and well perfused.  NEUROLOGIC: No obvious FND   DATA REVIEW  ECG: ***  As per my personal interpretation  ECHO: *** As per my personal interpretation  CATH: 11/09/2023: Mild, non-obstructive coronary artery disease consistent with nonischemic cardiomyopathy. Normal left heart, right heart, and pulmonary artery pressures. Low  normal to mildly reduced Fick cardiac output/index. Frequent PVC's noted throughout procedure.   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  Cardiac MRI pending.  Left heart catheterization as above.  Genetic testing*** NYHA class / AHA Stage:*** Volume status & Diuretics: Torsemide 20 mg daily Vasodilators: Valsartan 40 mg daily Beta-Blocker: Coreg 3.125 mg twice daily MRA: Spironolactone 12.5 mg daily Cardiometabolic:*** Devices therapies & Valvulopathies:*** Advanced therapies:***  2.  Frequent PVCs - 2 weeks Zio patch results pending - Cardiac MRI pending  3.  Hypertension - See above   Zayden Hahne Advanced Heart Failure Mechanical Circulatory Support

## 2023-12-08 ENCOUNTER — Other Ambulatory Visit: Payer: Self-pay

## 2023-12-08 ENCOUNTER — Ambulatory Visit (HOSPITAL_COMMUNITY)
Admission: RE | Admit: 2023-12-08 | Discharge: 2023-12-08 | Disposition: A | Discharge status: Home / Self Care | Source: Ambulatory Visit | Attending: Cardiology | Admitting: Cardiology

## 2023-12-08 ENCOUNTER — Encounter (HOSPITAL_COMMUNITY): Payer: Self-pay | Admitting: Cardiology

## 2023-12-08 ENCOUNTER — Other Ambulatory Visit (HOSPITAL_COMMUNITY): Payer: Self-pay

## 2023-12-08 ENCOUNTER — Telehealth (HOSPITAL_COMMUNITY): Payer: Self-pay

## 2023-12-08 VITALS — BP 110/70 | HR 61 | Wt 163.2 lb

## 2023-12-08 DIAGNOSIS — I2510 Atherosclerotic heart disease of native coronary artery without angina pectoris: Secondary | ICD-10-CM | POA: Diagnosis not present

## 2023-12-08 DIAGNOSIS — I5022 Chronic systolic (congestive) heart failure: Secondary | ICD-10-CM | POA: Diagnosis not present

## 2023-12-08 DIAGNOSIS — I10 Essential (primary) hypertension: Secondary | ICD-10-CM

## 2023-12-08 DIAGNOSIS — Z79899 Other long term (current) drug therapy: Secondary | ICD-10-CM | POA: Insufficient documentation

## 2023-12-08 DIAGNOSIS — K219 Gastro-esophageal reflux disease without esophagitis: Secondary | ICD-10-CM | POA: Insufficient documentation

## 2023-12-08 DIAGNOSIS — I493 Ventricular premature depolarization: Secondary | ICD-10-CM | POA: Diagnosis not present

## 2023-12-08 DIAGNOSIS — G4733 Obstructive sleep apnea (adult) (pediatric): Secondary | ICD-10-CM | POA: Diagnosis not present

## 2023-12-08 DIAGNOSIS — R001 Bradycardia, unspecified: Secondary | ICD-10-CM | POA: Diagnosis not present

## 2023-12-08 DIAGNOSIS — I110 Hypertensive heart disease with heart failure: Secondary | ICD-10-CM | POA: Insufficient documentation

## 2023-12-08 DIAGNOSIS — J449 Chronic obstructive pulmonary disease, unspecified: Secondary | ICD-10-CM | POA: Diagnosis not present

## 2023-12-08 DIAGNOSIS — I5042 Chronic combined systolic (congestive) and diastolic (congestive) heart failure: Secondary | ICD-10-CM

## 2023-12-08 DIAGNOSIS — I428 Other cardiomyopathies: Secondary | ICD-10-CM | POA: Insufficient documentation

## 2023-12-08 DIAGNOSIS — Z8249 Family history of ischemic heart disease and other diseases of the circulatory system: Secondary | ICD-10-CM | POA: Insufficient documentation

## 2023-12-08 LAB — BRAIN NATRIURETIC PEPTIDE: B Natriuretic Peptide: 156.9 pg/mL — ABNORMAL HIGH (ref 0.0–100.0)

## 2023-12-08 LAB — BASIC METABOLIC PANEL WITH GFR
Anion gap: 7 (ref 5–15)
BUN: 20 mg/dL (ref 8–23)
CO2: 35 mmol/L — ABNORMAL HIGH (ref 22–32)
Calcium: 9.8 mg/dL (ref 8.9–10.3)
Chloride: 103 mmol/L (ref 98–111)
Creatinine, Ser: 0.85 mg/dL (ref 0.44–1.00)
GFR, Estimated: >60 mL/min (ref >60)
Glucose, Bld: 84 mg/dL (ref 70–99)
Potassium: 4.4 mmol/L (ref 3.5–5.1)
Sodium: 145 mmol/L (ref 135–145)

## 2023-12-08 MED ORDER — ENTRESTO 24-26 MG PO TABS
1.0000 | ORAL_TABLET | Freq: Two times a day (BID) | ORAL | 11 refills | Status: DC
  Filled 2023-12-08: qty 60, 30d supply, fill #0
  Filled 2024-01-26: qty 60, 30d supply, fill #1
  Filled 2024-02-16 – 2024-02-17 (×2): qty 180, 90d supply, fill #2
  Filled 2024-05-23: qty 180, 90d supply, fill #3

## 2023-12-08 MED ORDER — JARDIANCE 10 MG PO TABS
10.0000 mg | ORAL_TABLET | Freq: Every day | ORAL | 11 refills | Status: AC
  Filled 2023-12-08: qty 30, 30d supply, fill #0
  Filled 2024-01-26: qty 30, 30d supply, fill #1
  Filled 2024-02-16 – 2024-02-17 (×2): qty 90, 90d supply, fill #2
  Filled 2024-05-23: qty 90, 90d supply, fill #3
  Filled 2024-08-16: qty 90, 90d supply, fill #4

## 2023-12-08 NOTE — Telephone Encounter (Signed)
 Advanced Heart Failure Patient Advocate Encounter  Prior authorization for Jardiance has been submitted and approved. Test billing returns $4 for 90 day supply.  Key: [id number] Effective: 11/24/2023 to 12/07/2024  Kennis Peacock, CPhT Rx Patient Advocate Phone: [phone]

## 2023-12-08 NOTE — Patient Instructions (Addendum)
 START Entresto 24/26 mg Twice daily  START Jardiance 10 mg daily.  STOP Valsartan  You can buy prilosec, nexum, Omperazole, and esomperazole  over the counter to help with you reflux.  Labs done today, your results will be available in MyChart, we will contact you for abnormal readings.  You have been referred to Dr. Verdis Glade. Her office will call you to arrange your appointment.  Your physician recommends that you schedule a follow-up appointment in: 1 month  If you have any questions or concerns before your next appointment please send us  a message through [place] or call our office at [phone].    TO LEAVE A MESSAGE FOR THE NURSE SELECT OPTION 2, PLEASE LEAVE A MESSAGE INCLUDING: YOUR NAME DATE OF BIRTH CALL BACK NUMBER REASON FOR CALL**this is important as we prioritize the call backs  YOU WILL RECEIVE A CALL BACK THE SAME DAY AS LONG AS YOU CALL BEFORE 4:00 PM  At the Advanced Heart Failure Clinic, you and your health needs are our priority. As part of our continuing mission to provide you with exceptional heart care, we have created designated Provider Care Teams. These Care Teams include your primary Cardiologist (physician) and Advanced Practice Providers (APPs- Physician Assistants and Nurse Practitioners) who all work together to provide you with the care you need, when you need it.   You may see any of the following providers on your designated Care Team at your next follow up: Dr Jules Oar Dr Peder Bourdon Dr. Alwin Baars Dr. Arta Lark Amy Marijane Shoulders, NP Ruddy Corral, [place] [facility] [place], [place] Dennise Fitz, NP [place] Lee, NP Shawnee Dellen, NP Luster Salters, PharmD Bevely Brush, PharmD   Please be sure to bring in all your medications bottles to every appointment.    Thank you for choosing [facility]

## 2023-12-14 NOTE — Addendum Note (Signed)
 Encounter addended by: Stan Eans, RN on: 12/14/2023 2:15 PM  Actions taken: Imaging Exam ended

## 2023-12-22 ENCOUNTER — Telehealth (HOSPITAL_COMMUNITY): Payer: Self-pay | Admitting: *Deleted

## 2023-12-22 DIAGNOSIS — I5042 Chronic combined systolic (congestive) and diastolic (congestive) heart failure: Secondary | ICD-10-CM

## 2023-12-22 DIAGNOSIS — I493 Ventricular premature depolarization: Secondary | ICD-10-CM

## 2023-12-22 NOTE — Telephone Encounter (Signed)
 Called patient per Dr. Bruce Caper with following heart monitor results:  "She has a fairly high PVC burden (23%) in the setting of TTN genetic cardiomyopathy. Can we please refer to EP for PVCs & ICD evaluation."  Pt verbalized understanding and agreement to same. EP referral sent. Reminded patient of upcoming appointment with Dr. Bruce Caper on 01/07/24 at 10:40. Pt confirmed she is coming to this appointment.

## 2023-12-29 ENCOUNTER — Encounter: Payer: Self-pay | Admitting: Nurse Practitioner

## 2023-12-29 ENCOUNTER — Other Ambulatory Visit (HOSPITAL_COMMUNITY): Payer: Self-pay

## 2023-12-29 ENCOUNTER — Ambulatory Visit (INDEPENDENT_AMBULATORY_CARE_PROVIDER_SITE_OTHER): Admitting: Nurse Practitioner

## 2023-12-29 VITALS — BP 110/55 | HR 72 | Wt 166.0 lb

## 2023-12-29 DIAGNOSIS — I5042 Chronic combined systolic (congestive) and diastolic (congestive) heart failure: Secondary | ICD-10-CM | POA: Diagnosis not present

## 2023-12-29 DIAGNOSIS — K219 Gastro-esophageal reflux disease without esophagitis: Secondary | ICD-10-CM

## 2023-12-29 DIAGNOSIS — R7303 Prediabetes: Secondary | ICD-10-CM | POA: Diagnosis not present

## 2023-12-29 DIAGNOSIS — J449 Chronic obstructive pulmonary disease, unspecified: Secondary | ICD-10-CM

## 2023-12-29 DIAGNOSIS — I10 Essential (primary) hypertension: Secondary | ICD-10-CM

## 2023-12-29 DIAGNOSIS — E785 Hyperlipidemia, unspecified: Secondary | ICD-10-CM | POA: Insufficient documentation

## 2023-12-29 MED ORDER — FLUTICASONE-SALMETEROL 250-50 MCG/ACT IN AEPB
1.0000 | INHALATION_SPRAY | Freq: Two times a day (BID) | RESPIRATORY_TRACT | 3 refills | Status: AC
  Filled 2023-12-29 – 2023-12-30 (×2): qty 60, 30d supply, fill #0
  Filled 2024-01-26 – 2024-08-16 (×3): qty 60, 30d supply, fill #1

## 2023-12-29 MED ORDER — OMEPRAZOLE 40 MG PO CPDR
40.0000 mg | DELAYED_RELEASE_CAPSULE | Freq: Every day | ORAL | 3 refills | Status: AC
  Filled 2023-12-29 – 2023-12-30 (×2): qty 30, 30d supply, fill #0
  Filled 2024-01-26: qty 30, 30d supply, fill #1
  Filled 2024-06-01: qty 30, 30d supply, fill #2
  Filled 2024-08-16: qty 30, 30d supply, fill #3

## 2023-12-29 MED ORDER — ALBUTEROL SULFATE HFA 108 (90 BASE) MCG/ACT IN AERS
2.0000 | INHALATION_SPRAY | Freq: Four times a day (QID) | RESPIRATORY_TRACT | 0 refills | Status: DC | PRN
  Filled 2023-12-29 – 2023-12-30 (×2): qty 18, 25d supply, fill #0

## 2023-12-29 NOTE — Assessment & Plan Note (Addendum)
 Lab Results  Component Value Date   CHOL 154 11/06/2023   HDL 45 11/06/2023   LDLCALC 89 11/06/2023   TRIG 102 11/06/2023   CHOLHDL 3.4 11/06/2023  Continue atorvastatin  40 mg daily Checking lipid panel

## 2023-12-29 NOTE — Assessment & Plan Note (Signed)
 Continue carvedilol  3.125 mg twice daily, torsemide  20 mg daily, Jardiance  10 mg daily, Entresto  24-26 mg 1 tablet twice daily spironolactone  12.5 mg twice daily DASH Diet advised, Patient encouraged to maintain close follow-up with cardiology

## 2023-12-29 NOTE — Assessment & Plan Note (Addendum)
 Lab Results  Component Value Date   HGBA1C 5.8 (H) 11/06/2023  Patient counseled on low-carb diet

## 2023-12-29 NOTE — Progress Notes (Signed)
 New Patient Office Visit  Subjective:  Patient ID: Amber Shaw, female    DOB: 11-27-1960  Age: 63 y.o. MRN: [id number]  CC:  Chief Complaint  Patient presents with   Establish Care   Hospitalization Follow-up    HPI Amber Shaw is a 63 y.o. female  has a past medical history of Acute combined systolic and diastolic heart failure (HCC) (52/84/1324), Arthritis, Asthma, Combined systolic and diastolic congestive heart failure (HCC) (11/05/2023), COPD (chronic obstructive pulmonary disease) (HCC), Hypertension, and Sleep apnea.  Patient presented to establish care for her chronic medical conditions.     Chronic combined systolic and diastolic heart failure she was on admission in March for acute systolic and diastolic CHF.  She monitors her blood pressure at home and it has been well-controlled .she has since establish care with cardiology and taking her medications as ordered. Patient states that she is doing well today she currently denies fever, chills, chest pain, shortness of breath, abdominal pain, nausea, vomiting  Former tobacco smoker.  Quit smoking cigarettes 3 years ago started smoking at age 37 smokes less than 1 pack of cigarettes daily.  Has COPD and takes albuterol  inhaler as needed  Stated that she is up-to-date with mammogram, colonoscopy, shingles vaccine.  Records requested from previous PCP     Past Medical History:  Diagnosis Date   Acute combined systolic and diastolic heart failure (HCC) 11/05/2023   Arthritis    Asthma    Combined systolic and diastolic congestive heart failure (HCC) 11/05/2023   COPD (chronic obstructive pulmonary disease) (HCC)    Hypertension    Sleep apnea     Past Surgical History:  Procedure Laterality Date   no prior surgery     RIGHT/LEFT HEART CATH AND CORONARY ANGIOGRAPHY N/A 11/09/2023   Procedure: RIGHT/LEFT HEART CATH AND CORONARY ANGIOGRAPHY;  Surgeon: Sammy Crisp, MD;  Location: [facility];  Service:  Cardiovascular;  Laterality: N/A;    Family History  Problem Relation Age of Onset   Cancer Mother    Hypertension Father    Heart disease Father        "Rare heart disease"  Died age 45   Colon cancer Maternal Grandfather 67   Cancer Other    Diabetes Other    Hyperlipidemia Other    Stroke Other     Social History   Socioeconomic History   Marital status: Single    Spouse name: Not on file   Number of children: 2   Years of education: Not on file   Highest education level: High school graduate  Occupational History   Occupation: Not working  Tobacco Use   Smoking status: Former    Current packs/day: 0.00    Average packs/day: 0.3 packs/day for 18.0 years (4.5 ttl pk-yrs)    Types: Cigarettes    Start date: 11/12/1996    Quit date: 11/13/2014    Years since quitting: 9.1   Smokeless tobacco: Never  Vaping Use   Vaping status: Former   Substances: Flavoring  Substance and Sexual Activity   Alcohol use: No    Alcohol/week: 0.0 standard drinks of alcohol   Drug use: No   Sexual activity: Not on file  Other Topics Concern   Not on file  Social History Narrative   Lives alone.     Two children.     Social Drivers of Health   Financial Resource Strain: Low Risk  (11/10/2023)   Overall Financial Resource Strain (CARDIA)  Difficulty of Paying Living Expenses: Not very hard  Food Insecurity: No Food Insecurity (11/05/2023)   Hunger Vital Sign    Worried About Running Out of Food in the Last Year: Never true    Ran Out of Food in the Last Year: Never true  Transportation Needs: No Transportation Needs (11/10/2023)   PRAPARE - Administrator, Civil Service (Medical): No    Lack of Transportation (Non-Medical): No  Physical Activity: Not on file  Stress: Not on file  Social Connections: Not on file  Intimate Partner Violence: Not At Risk (11/05/2023)   Humiliation, Afraid, Rape, and Kick questionnaire    Fear of Current or Ex-Partner: No    Emotionally  Abused: No    Physically Abused: No    Sexually Abused: No    ROS Review of Systems  Constitutional:  Negative for appetite change, chills, fatigue and fever.  HENT:  Negative for congestion, postnasal drip, rhinorrhea and sneezing.   Respiratory:  Negative for cough, shortness of breath and wheezing.   Cardiovascular:  Negative for chest pain, palpitations and leg swelling.  Gastrointestinal:  Negative for abdominal pain, constipation, nausea and vomiting.  Genitourinary:  Negative for difficulty urinating, dysuria, flank pain and frequency.  Musculoskeletal:  Negative for arthralgias, back pain, joint swelling and myalgias.  Skin:  Negative for color change, pallor, rash and wound.  Neurological:  Negative for dizziness, facial asymmetry, weakness, numbness and headaches.  Psychiatric/Behavioral:  Negative for behavioral problems, confusion, self-injury and suicidal ideas.     Objective:   Today's Vitals: BP (!) 110/55   Pulse 72   Wt 166 lb (75.3 kg)   LMP 05/16/2013   SpO2 100%   BMI 27.62 kg/m   Physical Exam Vitals and nursing note reviewed.  Constitutional:      General: She is not in acute distress.    Appearance: Normal appearance. She is not ill-appearing, toxic-appearing or diaphoretic.  Eyes:     General: No scleral icterus.       Right eye: No discharge.        Left eye: No discharge.     Extraocular Movements: Extraocular movements intact.     Conjunctiva/sclera: Conjunctivae normal.  Cardiovascular:     Rate and Rhythm: Normal rate and regular rhythm.     Pulses: Normal pulses.     Heart sounds: Normal heart sounds. No murmur heard.    No friction rub. No gallop.  Pulmonary:     Effort: Pulmonary effort is normal. No respiratory distress.     Breath sounds: Normal breath sounds. No stridor. No wheezing, rhonchi or rales.  Chest:     Chest wall: No tenderness.  Abdominal:     General: There is no distension.     Palpations: Abdomen is soft.      Tenderness: There is no abdominal tenderness. There is no right CVA tenderness, left CVA tenderness or guarding.  Musculoskeletal:        General: No swelling, tenderness, deformity or signs of injury.     Right lower leg: No edema.     Left lower leg: No edema.  Skin:    General: Skin is warm and dry.     Capillary Refill: Capillary refill takes 2 to 3 seconds.     Coloration: Skin is not jaundiced or pale.     Findings: No bruising, erythema or lesion.  Neurological:     Mental Status: She is alert and oriented to person, place, and time.  Motor: No weakness.     Coordination: Coordination normal.     Gait: Gait normal.  Psychiatric:        Mood and Affect: Mood normal.        Behavior: Behavior normal.        Thought Content: Thought content normal.        Judgment: Judgment normal.     Assessment & Plan:   Problem List Items Addressed This Visit       Cardiovascular and Mediastinum   Essential hypertension, benign   Blood pressure is well-controlled on spironolactone  12.5 mg daily, Entresto  24-26 mg 1 tablet twice daily, torsemide  20 mg daily, carvedilol  3.125 mg twice daily DASH diet and commitment to daily physical activity for a minimum of 30 minutes discussed and encouraged, as a part of hypertension management.      12/29/2023   10:51 AM 12/29/2023   10:09 AM 12/08/2023    9:51 AM 11/17/2023    9:32 AM 11/10/2023    2:57 PM 11/10/2023   10:27 AM 11/10/2023    9:19 AM  BP/Weight  Systolic BP 110 103 110 118 93 99 94  Diastolic BP 55 47 70 64 60 62 56  Wt. (Lbs)  166 163.2 164.2     BMI  27.62 kg/m2 27.16 kg/m2 27.32 kg/m2              Chronic combined systolic and diastolic CHF (congestive heart failure) (HCC) - Primary   Continue carvedilol  3.125 mg twice daily, torsemide  20 mg daily, Jardiance  10 mg daily, Entresto  24-26 mg 1 tablet twice daily spironolactone  12.5 mg twice daily DASH Diet advised, Patient encouraged to maintain close follow-up with  cardiology        Respiratory   COPD GOLD III    - fluticasone -salmeterol (ADVAIR  DISKUS) 250-50 MCG/ACT AEPB; Inhale 1 puff into the lungs in the morning and at bedtime. (Patient not taking: Reported on 12/29/2023)  Dispense: 60 each; Refill: 3 - albuterol  (VENTOLIN  HFA) 108 (90 Base) MCG/ACT inhaler; Inhale 2 puffs into the lungs every 6 (six) hours as needed for wheezing or shortness of breath.  Dispense: 18 g; Refill: 0          Relevant Medications   fluticasone -salmeterol (ADVAIR  DISKUS) 250-50 MCG/ACT AEPB   albuterol  (VENTOLIN  HFA) 108 (90 Base) MCG/ACT inhaler     Digestive   Gastroesophageal reflux disease   Takes OTC omeprazole  Would like a prescription sent to her pharmacy Omeprazole  40 mg daily ordered Avoid fatty fried foods, spicy food, caffeinated drinks, and other food that triggers acid reflux symptoms      Relevant Medications   omeprazole  (PRILOSEC) 40 MG capsule     Other   Dyslipidemia   Lab Results  Component Value Date   CHOL 154 11/06/2023   HDL 45 11/06/2023   LDLCALC 89 11/06/2023   TRIG 102 11/06/2023   CHOLHDL 3.4 11/06/2023  Continue atorvastatin  40 mg daily Checking lipid panel      Relevant Orders   Lipid panel   Prediabetes   Lab Results  Component Value Date   HGBA1C 5.8 (H) 11/06/2023  Patient counseled on low-carb diet       Outpatient Encounter Medications as of 12/29/2023  Medication Sig   aspirin  EC 81 MG tablet Take 1 tablet (81 mg total) by mouth daily. Swallow whole.   atorvastatin  (LIPITOR) 40 MG tablet Take 1 tablet (40 mg total) by mouth daily.   carvedilol  (COREG ) 3.125 MG tablet Take  1 tablet (3.125 mg total) by mouth 2 (two) times daily with a meal.   JARDIANCE  10 MG TABS tablet Take 1 tablet (10 mg total) by mouth daily before breakfast.   omeprazole  (PRILOSEC) 40 MG capsule Take 1 capsule (40 mg total) by mouth daily.   sacubitril -valsartan  (ENTRESTO ) 24-26 MG Take 1 tablet by mouth 2 (two) times daily.    spironolactone  (ALDACTONE ) 25 MG tablet Take 0.5 tablets (12.5 mg total) by mouth daily.   torsemide  (DEMADEX ) 20 MG tablet Take 1 tablet (20 mg total) by mouth daily.   [DISCONTINUED] albuterol  (VENTOLIN  HFA) 108 (90 Base) MCG/ACT inhaler Inhale 2 puffs into the lungs every 4 (four) hours as needed for wheezing or shortness of breath.   albuterol  (VENTOLIN  HFA) 108 (90 Base) MCG/ACT inhaler Inhale 2 puffs into the lungs every 6 (six) hours as needed for wheezing or shortness of breath.   fluticasone -salmeterol (ADVAIR  DISKUS) 250-50 MCG/ACT AEPB Inhale 1 puff into the lungs in the morning and at bedtime. (Patient not taking: Reported on 12/29/2023)   [DISCONTINUED] fluticasone -salmeterol (ADVAIR  DISKUS) 250-50 MCG/ACT AEPB Inhale 1 puff into the lungs in the morning and at bedtime. (Patient not taking: Reported on 12/29/2023)   No facility-administered encounter medications on file as of 12/29/2023.    Follow-up: Return in about 4 months (around 04/30/2024) for HTN, HYPERLIPIDEMIA.   Amber Shaw R Sable Knoles, FNP

## 2023-12-29 NOTE — Assessment & Plan Note (Signed)
 Takes OTC omeprazole  Would like a prescription sent to her pharmacy Omeprazole  40 mg daily ordered Avoid fatty fried foods, spicy food, caffeinated drinks, and other food that triggers acid reflux symptoms

## 2023-12-29 NOTE — Assessment & Plan Note (Addendum)
-   fluticasone -salmeterol (ADVAIR  DISKUS) 250-50 MCG/ACT AEPB; Inhale 1 puff into the lungs in the morning and at bedtime. (Patient not taking: Reported on 12/29/2023)  Dispense: 60 each; Refill: 3 - albuterol  (VENTOLIN  HFA) 108 (90 Base) MCG/ACT inhaler; Inhale 2 puffs into the lungs every 6 (six) hours as needed for wheezing or shortness of breath.  Dispense: 18 g; Refill: 0

## 2023-12-29 NOTE — Assessment & Plan Note (Signed)
 Blood pressure is well-controlled on spironolactone  12.5 mg daily, Entresto  24-26 mg 1 tablet twice daily, torsemide  20 mg daily, carvedilol  3.125 mg twice daily DASH diet and commitment to daily physical activity for a minimum of 30 minutes discussed and encouraged, as a part of hypertension management.      12/29/2023   10:51 AM 12/29/2023   10:09 AM 12/08/2023    9:51 AM 11/17/2023    9:32 AM 11/10/2023    2:57 PM 11/10/2023   10:27 AM 11/10/2023    9:19 AM  BP/Weight  Systolic BP 110 103 110 118 93 99 94  Diastolic BP 55 47 70 64 60 62 56  Wt. (Lbs)  166 163.2 164.2     BMI  27.62 kg/m2 27.16 kg/m2 27.32 kg/m2

## 2023-12-29 NOTE — Patient Instructions (Signed)

## 2023-12-30 ENCOUNTER — Other Ambulatory Visit: Payer: Self-pay

## 2023-12-30 ENCOUNTER — Other Ambulatory Visit: Payer: Self-pay | Admitting: Nurse Practitioner

## 2023-12-30 LAB — LIPID PANEL
Chol/HDL Ratio: 3.1 ratio (ref 0.0–4.4)
Cholesterol, Total: 194 mg/dL (ref 100–199)
HDL: 62 mg/dL (ref >39)
LDL Chol Calc (NIH): 112 mg/dL — ABNORMAL HIGH (ref 0–99)
Triglycerides: 111 mg/dL (ref 0–149)
VLDL Cholesterol Cal: 20 mg/dL (ref 5–40)

## 2024-01-01 ENCOUNTER — Other Ambulatory Visit (HOSPITAL_COMMUNITY): Payer: Self-pay

## 2024-01-01 ENCOUNTER — Ambulatory Visit (HOSPITAL_COMMUNITY)

## 2024-01-04 DIAGNOSIS — Z419 Encounter for procedure for purposes other than remedying health state, unspecified: Secondary | ICD-10-CM | POA: Diagnosis not present

## 2024-01-05 ENCOUNTER — Institutional Professional Consult (permissible substitution): Admitting: Cardiology

## 2024-01-07 ENCOUNTER — Encounter (HOSPITAL_COMMUNITY): Admitting: Cardiology

## 2024-01-08 ENCOUNTER — Ambulatory Visit: Payer: Self-pay

## 2024-01-12 ENCOUNTER — Ambulatory Visit: Attending: Cardiology | Admitting: Cardiology

## 2024-01-12 NOTE — Progress Notes (Deleted)
  Electrophysiology Office Note:   Date:  01/12/2024  ID:  Amber Shaw, DOB 06-12-1961, MRN [id number]  Primary Cardiologist: Eilleen Grates, MD Primary Heart Failure: None Electrophysiologist: None  {Click to update primary MD,subspecialty MD or APP then REFRESH:1}    History of Present Illness:   Amber Shaw is a 63 y.o. female with h/o COPD, hypertension, sleep apnea, chronic systolic heart failure, PVCs seen today for  for Electrophysiology evaluation of heart failure, PVCs at the request of Alwin Baars.    She was admitted to the hospital 2025 with new onset heart failure.  Ejection fraction was found to be 30% with global hypokinesis.  Right and left heart catheterization showed nonobstructive coronary artery disease.  She was noted to have PVCs.  She wore a cardiac monitor that showed a 26.9% burden.  Today, denies symptoms of palpitations, chest pain, shortness of breath, orthopnea, PND, lower extremity edema, claudication, dizziness, presyncope, syncope, bleeding, or neurologic sequela. The patient is tolerating medications without difficulties. ***   Review of systems complete and found to be negative unless listed in HPI.   EP Information / Studies Reviewed:    {EKGtoday:28818}        Risk Assessment/Calculations:            Physical Exam:   VS:  LMP 05/16/2013    Wt Readings from Last 3 Encounters:  12/29/23 166 lb (75.3 kg)  12/08/23 163 lb 3.2 oz (74 kg)  11/17/23 164 lb 3.2 oz (74.5 kg)     GEN: Well nourished, well developed in no acute distress NECK: No JVD; No carotid bruits CARDIAC: {EPRHYTHM:28826}, no murmurs, rubs, gallops RESPIRATORY:  Clear to auscultation without rales, wheezing or rhonchi  ABDOMEN: Soft, non-tender, non-distended EXTREMITIES:  No edema; No deformity   ASSESSMENT AND PLAN:    1.  Chronic systolic heart failure: Ejection fraction 30%.  On medical therapy per heart failure cardiology.  2.  PVCs: Elevated burden at  26.9%.***  3.  Hypertension:***   Follow up with {IONGE:95284} {EPFOLLOW XL:24401}  Signed, Twinkle Sockwell Cortland Ding, MD

## 2024-01-13 ENCOUNTER — Encounter: Payer: Self-pay | Admitting: Cardiology

## 2024-01-19 ENCOUNTER — Encounter (HOSPITAL_COMMUNITY): Payer: Self-pay

## 2024-01-21 ENCOUNTER — Other Ambulatory Visit (HOSPITAL_COMMUNITY): Payer: Self-pay | Admitting: Cardiology

## 2024-01-21 ENCOUNTER — Ambulatory Visit (HOSPITAL_COMMUNITY)
Admission: RE | Admit: 2024-01-21 | Discharge: 2024-01-21 | Disposition: A | Discharge status: Home / Self Care | Source: Ambulatory Visit | Attending: Cardiology | Admitting: Cardiology

## 2024-01-21 DIAGNOSIS — I5041 Acute combined systolic (congestive) and diastolic (congestive) heart failure: Secondary | ICD-10-CM | POA: Diagnosis not present

## 2024-01-21 MED ORDER — GADOBUTROL 1 MMOL/ML IV SOLN
10.0000 mL | Freq: Once | INTRAVENOUS | Status: AC | PRN
  Administered 2024-01-21: 10 mL via INTRAVENOUS

## 2024-01-26 ENCOUNTER — Other Ambulatory Visit: Payer: Self-pay

## 2024-01-26 ENCOUNTER — Other Ambulatory Visit: Payer: Self-pay | Admitting: Nurse Practitioner

## 2024-01-26 DIAGNOSIS — J449 Chronic obstructive pulmonary disease, unspecified: Secondary | ICD-10-CM

## 2024-01-26 MED ORDER — ALBUTEROL SULFATE HFA 108 (90 BASE) MCG/ACT IN AERS
2.0000 | INHALATION_SPRAY | Freq: Four times a day (QID) | RESPIRATORY_TRACT | 0 refills | Status: AC | PRN
  Filled 2024-01-26: qty 18, 25d supply, fill #0

## 2024-01-29 ENCOUNTER — Other Ambulatory Visit (HOSPITAL_COMMUNITY): Payer: Self-pay | Admitting: Cardiology

## 2024-01-29 ENCOUNTER — Other Ambulatory Visit: Payer: Self-pay

## 2024-02-02 NOTE — Progress Notes (Incomplete)
   [facility] CLIN[facility] NOTE  Referring Physician: Paseda, Folashade R, FNP  Primary Care: Paseda, Folashade R, FNP Primary Cardiologist: Dr. Alda Amas  CC: Heart failure with reduced ejection fraction  HPI: Amber Shaw is a 63 y.o. female with systolic heart failure, COPD, hypertension, obstructive sleep apnea on CPAP, family history of sudden cardiac death (father in 33s, sister in late 64s) presenting today to establish care.  Her cardiac history dates back to March 2018 when she was admitted to [facility] long with shortness of breath; echocardiogram with LVEF of 30% at that time with moderately reduced RV function.  Patient was diuresed and sent to [facility] where right and left heart catheterization demonstrated nonobstructive CAD and right heart cath with normal filling pressures and cardiac index of 2.3 L/min/m.  During admission and procedures patient was noted to have frequent PVCs.  Interval history Since discharge from the hospital, patient has been seen in [facility] clinic where GDMT was added on and uptitrated. According to Ms. Ladley she has been short of breath since October 2024. During that time she had an infection from a 'bug bite' at her left gluteal muscle. Reports that she had required a drain for an abscess; required 2 weeks of IV antibiotics. She believes this prolonged hospitalization led to her systolic heart failure. Today she reports significant improvement in symptoms. She has minimal dyspnea, LE edema, PND or orthopnea  Activity level/exercise tolerance:  NYHA IIB; possibly undermining symptoms.  Orthopnea:  Sleeps on 2 pillows Paroxysmal noctural dyspnea:  no Chest pain/pressure:  no Orthostatic lightheadedness:  no Palpitations:  no Lower extremity edema:  no Presyncope/syncope:  no Cough:  no  Pertinent Family & social hx:  Mother: CHF, Sister: MI in her 18s   PHYSICAL EXAM: There were no vitals filed for this visit. GENERAL: NAD Lungs-  *** CARDIAC:  JVP: *** cm          Normal rate with regular rhythm. *** murmur.  Pulses ***. *** edema.  ABDOMEN: Soft, non-tender, non-distended.  EXTREMITIES: Warm and well perfused.  NEUROLOGIC: No obvious FND    DATA REVIEW  ECG: 11/17/23: NSR with frequent PVCs  As per my personal interpretation  ECHO: 11/06/23: LVEF 30% As per my personal interpretation  CATH: 11/09/2023: Mild, non-obstructive coronary artery disease consistent with nonischemic cardiomyopathy. Normal left heart, right heart, and pulmonary artery pressures. Low normal to mildly reduced Fick cardiac output/index. Frequent PVC's noted throughout procedure.   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  Cardiac MRI pending.  Left heart catheterization as above.  Genetic testing with TTN; refer to Dr. Hallie Levers.   NYHA class / AHA Stage:NYHA IIB Volume status & Diuretics: Torsemide  20 mg daily Vasodilators: Valsartan  40 mg daily; D/C & start Entresto  24/26mg  BID Beta-Blocker: Coreg  3.125 mg twice daily MRA: Spironolactone  12.5 mg daily Cardiometabolic:start farxiga 10mg  daily Devices therapies & Valvulopathies:Not currently indicated; repeat echo in 28m.  Advanced therapies:Not indicated.   2.  Frequent PVCs - 2 weeks Zio patch results pending - Cardiac MRI pending  3.  Hypertension - See above  4. GERD - Reports that she has severe reflux - start PPI daily  I spent *** minutes caring for this patient today including face to face time, ordering and reviewing labs, reviewing records from ***, seeing the patient, documenting in the record, and arranging follow ups.   Jonnie Truxillo Advanced [facility] Failure Mechanical Circulatory Support

## 2024-02-03 ENCOUNTER — Ambulatory Visit (HOSPITAL_COMMUNITY): Payer: Self-pay | Admitting: Cardiology

## 2024-02-03 ENCOUNTER — Other Ambulatory Visit: Payer: Self-pay

## 2024-02-03 ENCOUNTER — Ambulatory Visit (HOSPITAL_COMMUNITY)
Admission: RE | Admit: 2024-02-03 | Discharge: 2024-02-03 | Disposition: A | Discharge status: Home / Self Care | Source: Ambulatory Visit | Attending: Cardiology | Admitting: Cardiology

## 2024-02-03 VITALS — BP 104/82 | HR 65 | Wt 164.2 lb

## 2024-02-03 DIAGNOSIS — J449 Chronic obstructive pulmonary disease, unspecified: Secondary | ICD-10-CM | POA: Insufficient documentation

## 2024-02-03 DIAGNOSIS — Z79899 Other long term (current) drug therapy: Secondary | ICD-10-CM | POA: Diagnosis not present

## 2024-02-03 DIAGNOSIS — I110 Hypertensive heart disease with heart failure: Secondary | ICD-10-CM | POA: Insufficient documentation

## 2024-02-03 DIAGNOSIS — K5900 Constipation, unspecified: Secondary | ICD-10-CM | POA: Diagnosis not present

## 2024-02-03 DIAGNOSIS — K219 Gastro-esophageal reflux disease without esophagitis: Secondary | ICD-10-CM | POA: Diagnosis not present

## 2024-02-03 DIAGNOSIS — Z8241 Family history of sudden cardiac death: Secondary | ICD-10-CM | POA: Insufficient documentation

## 2024-02-03 DIAGNOSIS — I428 Other cardiomyopathies: Secondary | ICD-10-CM | POA: Diagnosis not present

## 2024-02-03 DIAGNOSIS — I10 Essential (primary) hypertension: Secondary | ICD-10-CM

## 2024-02-03 DIAGNOSIS — I493 Ventricular premature depolarization: Secondary | ICD-10-CM | POA: Diagnosis not present

## 2024-02-03 DIAGNOSIS — G4733 Obstructive sleep apnea (adult) (pediatric): Secondary | ICD-10-CM | POA: Insufficient documentation

## 2024-02-03 DIAGNOSIS — I5042 Chronic combined systolic (congestive) and diastolic (congestive) heart failure: Secondary | ICD-10-CM

## 2024-02-03 DIAGNOSIS — I5022 Chronic systolic (congestive) heart failure: Secondary | ICD-10-CM | POA: Insufficient documentation

## 2024-02-03 DIAGNOSIS — E876 Hypokalemia: Secondary | ICD-10-CM | POA: Diagnosis not present

## 2024-02-03 LAB — BASIC METABOLIC PANEL WITH GFR
Anion gap: 10 (ref 5–15)
BUN: 11 mg/dL (ref 8–23)
CO2: 33 mmol/L — ABNORMAL HIGH (ref 22–32)
Calcium: 9.4 mg/dL (ref 8.9–10.3)
Chloride: 99 mmol/L (ref 98–111)
Creatinine, Ser: 0.77 mg/dL (ref 0.44–1.00)
GFR, Estimated: >60 mL/min (ref >60)
Glucose, Bld: 83 mg/dL (ref 70–99)
Potassium: 2.7 mmol/L — CL (ref 3.5–5.1)
Sodium: 142 mmol/L (ref 135–145)

## 2024-02-03 LAB — BRAIN NATRIURETIC PEPTIDE: B Natriuretic Peptide: 106.6 pg/mL — ABNORMAL HIGH (ref 0.0–100.0)

## 2024-02-03 MED ORDER — SPIRONOLACTONE 25 MG PO TABS
25.0000 mg | ORAL_TABLET | Freq: Every day | ORAL | 5 refills | Status: DC
  Filled 2024-02-03: qty 30, 30d supply, fill #0
  Filled 2024-02-16: qty 90, 90d supply, fill #0
  Filled 2024-02-17 – 2024-05-23 (×2): qty 30, 30d supply, fill #0

## 2024-02-03 MED ORDER — POTASSIUM CHLORIDE CRYS ER 20 MEQ PO TBCR
EXTENDED_RELEASE_TABLET | ORAL | 0 refills | Status: DC
  Filled 2024-02-03: qty 6, 1d supply, fill #0

## 2024-02-03 NOTE — Telephone Encounter (Signed)
 Spoke with patient and made her aware of the below. Patient will take potassium today as prescribed, and increase spironolactone  tomorrow and is scheduled for repeat labs next week. Medication list updated and medications sent to patient preferred pharmacy.   Advised patient to call back to office with any issues, questions, or concerns. Patient verbalized understanding.     Also Per Dr. Bruce Caper- have patient take spironolactone  25mg  once daily.   Sabharwal, Aditya, DO to Me  (Selected Message) AS   02/03/24  1:28 PM Result Note Can we please ask Amber Shaw to take KCL 80meq now followed by another 40 tonight. Lets repeat a BMP next week. Thank you.

## 2024-02-03 NOTE — Addendum Note (Signed)
 Encounter addended by: Hester Forget B, RN on: 02/03/2024 10:57 AM  Actions taken: Order list changed, Diagnosis association updated, Clinical Note Signed

## 2024-02-03 NOTE — Patient Instructions (Addendum)
 Medication Changes:  STOP ASPIRIN    Lab Work:  Labs done today, your results will be available in MyChart, we will contact you for abnormal readings.  YOU HAVE BEEN REFERRED TO ELECTROPHYSIOLOGY THEY WILL REACH OUT TO YOU OR CALL TO ARRANGE THIS. PLEASE CALL US  WITH ANY CONCERNS    Follow-Up in: 3 MONTHS AS SCHEDULED WITH DR. Bruce Caper WITH ECHO   At the [facility], you and your health needs are our priority. We have a designated team specialized in the treatment of Heart Failure. This Care Team includes your primary Heart Failure Specialized Cardiologist (physician), Advanced Practice Providers (APPs- Physician Assistants and Nurse Practitioners), and Pharmacist who all work together to provide you with the care you need, when you need it.   You may see any of the following providers on your designated Care Team at your next follow up:  Dr. Jules Oar Dr. Peder Bourdon Dr. Alwin Baars Dr. Judyth Nunnery Nieves Bars, NP Ruddy Corral, [place] [facility] [place], [place] Dennise Fitz, NP [place] Lee, NP Luster Salters, PharmD   Please be sure to bring in all your medications bottles to every appointment.   Need to Contact Us :  If you have any questions or concerns before your next appointment please send us  a message through [place] or call our office at [phone].    TO LEAVE A MESSAGE FOR THE NURSE SELECT OPTION 2, PLEASE LEAVE A MESSAGE INCLUDING: YOUR NAME DATE OF BIRTH CALL BACK NUMBER REASON FOR CALL**this is important as we prioritize the call backs  YOU WILL RECEIVE A CALL BACK THE SAME DAY AS LONG AS YOU CALL BEFORE 4:00 PM.

## 2024-02-04 ENCOUNTER — Other Ambulatory Visit: Payer: Self-pay

## 2024-02-04 DIAGNOSIS — Z419 Encounter for procedure for purposes other than remedying health state, unspecified: Secondary | ICD-10-CM | POA: Diagnosis not present

## 2024-02-04 NOTE — Addendum Note (Signed)
 Encounter addended by: Alwin Baars, DO on: 02/04/2024 10:46 AM  Actions taken: Clinical Note Signed

## 2024-02-05 ENCOUNTER — Other Ambulatory Visit: Payer: Self-pay

## 2024-02-08 ENCOUNTER — Ambulatory Visit: Admitting: Genetic Counselor

## 2024-02-08 ENCOUNTER — Ambulatory Visit
Admission: RE | Admit: 2024-02-08 | Discharge: 2024-02-08 | Disposition: A | Discharge status: Home / Self Care | Source: Ambulatory Visit | Attending: Cardiology | Admitting: Cardiology

## 2024-02-08 DIAGNOSIS — I5042 Chronic combined systolic (congestive) and diastolic (congestive) heart failure: Secondary | ICD-10-CM | POA: Insufficient documentation

## 2024-02-08 LAB — BASIC METABOLIC PANEL WITH GFR
Anion gap: 14 (ref 5–15)
BUN: 16 mg/dL (ref 8–23)
CO2: 32 mmol/L (ref 22–32)
Calcium: 9.5 mg/dL (ref 8.9–10.3)
Chloride: 100 mmol/L (ref 98–111)
Creatinine, Ser: 1.16 mg/dL — ABNORMAL HIGH (ref 0.44–1.00)
GFR, Estimated: 54 mL/min — ABNORMAL LOW (ref >60)
Glucose, Bld: 164 mg/dL — ABNORMAL HIGH (ref 70–99)
Potassium: 4.3 mmol/L (ref 3.5–5.1)
Sodium: 146 mmol/L — ABNORMAL HIGH (ref 135–145)

## 2024-02-08 NOTE — Progress Notes (Signed)
 Electrophysiology Office Note:    Date:  02/09/2024   ID:  Amber Shaw, DOB [birth year], MRN [id number]  PCP:  Paseda, Folashade R, FNP   [facility] Providers Cardiologist:  Eilleen Grates, MD Advanced Heart Failure:  Alwin Baars, DO     Referring MD: Alwin Baars, DO   History of Present Illness:    Amber Shaw is a 63 y.o. female with a medical history significant for frequent PVCs, heart failure with reduced ejection fraction, hypertension, hypokalemia, referred for rhythm management.     She was diagnosed with cardiomyopathy while admitted to [facility] with shortness of breath in 2018.  LVEF at that time was 30% coronary angiogram showed known nonobstructive coronary disease.  She was noted to have frequent PVCs at this time.  Discussed the use of AI scribe software for clinical note transcription with the patient, who gave verbal consent to proceed.  History of Present Illness Amber Shaw is a 63 year old female with heart failure and abnormal heart rhythms who presents for evaluation of abnormal heartbeats.  In 2018, she initially presented with symptoms of shortness of breath and fluid retention. A heart catheterization revealed a heart function of approximately 30%. She has been managing her condition with a diuretic, which has helped control the fluid retention.  She has been experiencing abnormal heartbeats, which have been noted as having two different morphologies on her EKGs. These beats occur frequently, although she does not feel them. She is currently taking Entresto  and Jardiance , but these medications have not effectively addressed the abnormal beats.  She reports a family history of heart issues, noting that two of her aunts are on the same medication regimen. One aunt experienced significant fluid retention, resulting in heart damage and a weight loss of 81 pounds after treatment.  She recalls being given large potassium pills in the past  and expresses relief at avoiding potassium IV treatment, which her brother found painful.  She recently sustained a burn while barbecuing, which is healing but causes itching.         Today, she reports she feels well and has no complaints.  EKGs/Labs/Other Studies Reviewed Today:     Echocardiogram:  TTE November 06, 2023 LVEF 30%.  Mild concentric LVH.  Normal RV size.  Left atrium is moderately dilated.   Monitors:  14 day monitor March 2025-- my interpretation Sinus rhythm 47 to 133 bpm, average 75 23.4% PVCs.  Multiple PVC morphologies were seen, but there was 1 dominant morphology comprising 22.9% of the 23.4%  There were episodes of wide-complex tachycardia up to 5.4 seconds in duration  Advanced imaging:  Cardiac MRI Jan 21, 2024 Difficult images due to frequent PVCs.  Diffuse LV hypokinesis, EF 39%.  RVEF 42%.  Mid wall LGE stripe in the basal septum.  Cardiac catherization  Coronary angiogram March 2025 Mild, nonobstructive coronary disease.  Frequent PVCs noted  EKG:   EKG Interpretation Date/Time:  Tuesday February 09 2024 11:14:57 EDT Ventricular Rate:  76 PR Interval:  150 QRS Duration:  90 QT Interval:  436 QTC Calculation: 490 R Axis:   93  Text Interpretation: Suspected ectopic atrial rhythm with frequent Premature ventricular complexes Rightward axis Minimal voltage criteria for LVH, may be normal variant ( Cornell product ) ST & T wave abnormality, consider inferior ischemia Prolonged QT When compared with ECG of 03-Feb-2024 10:45, PVC morphology and axis have changed Confirmed by Marlane Silver [phone]) on 02/09/2024 11:39:04 AM     Physical Exam:  VS:  BP 118/81 (BP Location: Left Arm, Patient Position: Sitting, Cuff Size: Normal)   Pulse 76   Ht 5' 5 (1.651 m)   Wt 161 lb 8 oz (73.3 kg)   LMP 05/16/2013   SpO2 95%   BMI 26.88 kg/m     Wt Readings from Last 3 Encounters:  02/09/24 161 lb 8 oz (73.3 kg)  02/03/24 164 lb 3.2 oz (74.5 kg)   12/29/23 166 lb (75.3 kg)     GEN: Well nourished, well developed in no acute distress CARDIAC: RRR, no murmurs, rubs, gallops. Extrasystoles noted RESPIRATORY:  Normal work of breathing MUSCULOSKELETAL: no edema    ASSESSMENT & PLAN:     Frequent PVCs On review of the ECGs, there are at least 3 morphologies Recent monitor showed 1 dominant morphology, but today on EKG there are two Due to the presence of multiple morphologies, we will start with an antiarrhythmic since ablation will be complicated We will start mexiletine 150 mg twice daily Start magnesium taurate  CHF with reduced ejection fraction LVEF slightly improved on recent MRI No longer indicated DC arrange for defibrillator Appears well compensated. Continue carvedilol  3.125, Jardiance  10 mg, Entresto  24-26, Aldactone  25 mg  Hypertension Diastolic slightly above goal today  Ectropic atrial rhythm Noted on ECG 02/03/2024, 02/09/2024  Hypokalemia Will need to maintain K+ > 4    Signed, Efraim Grange, MD  02/09/2024 11:39 AM    [facility]

## 2024-02-09 ENCOUNTER — Encounter: Payer: Self-pay | Admitting: Cardiovascular Disease

## 2024-02-09 ENCOUNTER — Ambulatory Visit: Attending: Cardiovascular Disease | Admitting: Cardiovascular Disease

## 2024-02-09 ENCOUNTER — Other Ambulatory Visit: Payer: Self-pay

## 2024-02-09 VITALS — BP 118/81 | HR 76 | Ht 65.0 in | Wt 161.5 lb

## 2024-02-09 DIAGNOSIS — I5042 Chronic combined systolic (congestive) and diastolic (congestive) heart failure: Secondary | ICD-10-CM | POA: Insufficient documentation

## 2024-02-09 DIAGNOSIS — I493 Ventricular premature depolarization: Secondary | ICD-10-CM | POA: Insufficient documentation

## 2024-02-09 MED ORDER — MAGNESIUM 400 MG PO TABS
400.0000 mg | ORAL_TABLET | Freq: Every day | ORAL | 3 refills | Status: AC
  Filled 2024-02-09: qty 90, fill #0

## 2024-02-09 MED ORDER — MEXILETINE HCL 150 MG PO CAPS
150.0000 mg | ORAL_CAPSULE | Freq: Two times a day (BID) | ORAL | 11 refills | Status: AC
  Filled 2024-02-09: qty 60, 30d supply, fill #0
  Filled 2024-08-16: qty 60, 30d supply, fill #1

## 2024-02-09 NOTE — Patient Instructions (Signed)
 Medication Instructions:  START Mexiletine 150 mg twice daily  START Magnesium TAURATE 400 mg once daily - this can be difficult to find locally but can be purchased from [email or web address] or [email or web address]  *If you need a refill on your cardiac medications before your next appointment, please call your pharmacy*  Follow-Up: At [facility], you and your health needs are our priority.  As part of our continuing mission to provide you with exceptional heart care, our providers are all part of one team.  This team includes your primary Cardiologist (physician) and Advanced Practice Providers or APPs (Physician Assistants and Nurse Practitioners) who all work together to provide you with the care you need, when you need it.  Your next appointment:   3 month(s)  Provider:   Marlane Silver, MD

## 2024-02-11 ENCOUNTER — Other Ambulatory Visit: Payer: Self-pay | Admitting: Cardiovascular Disease

## 2024-02-11 ENCOUNTER — Other Ambulatory Visit: Payer: Self-pay

## 2024-02-15 ENCOUNTER — Other Ambulatory Visit: Payer: Self-pay

## 2024-02-16 ENCOUNTER — Other Ambulatory Visit: Payer: Self-pay

## 2024-02-17 ENCOUNTER — Other Ambulatory Visit: Payer: Self-pay

## 2024-02-19 ENCOUNTER — Other Ambulatory Visit: Payer: Self-pay

## 2024-02-23 ENCOUNTER — Other Ambulatory Visit: Payer: Self-pay

## 2024-03-05 DIAGNOSIS — Z419 Encounter for procedure for purposes other than remedying health state, unspecified: Secondary | ICD-10-CM | POA: Diagnosis not present

## 2024-04-05 DIAGNOSIS — Z419 Encounter for procedure for purposes other than remedying health state, unspecified: Secondary | ICD-10-CM | POA: Diagnosis not present

## 2024-04-12 DIAGNOSIS — G4733 Obstructive sleep apnea (adult) (pediatric): Secondary | ICD-10-CM | POA: Diagnosis not present

## 2024-04-26 ENCOUNTER — Ambulatory Visit (HOSPITAL_COMMUNITY)

## 2024-04-26 ENCOUNTER — Encounter (HOSPITAL_COMMUNITY): Admitting: Cardiology

## 2024-05-06 DIAGNOSIS — Z419 Encounter for procedure for purposes other than remedying health state, unspecified: Secondary | ICD-10-CM | POA: Diagnosis not present

## 2024-05-18 ENCOUNTER — Ambulatory Visit: Admitting: Cardiovascular Disease

## 2024-05-18 ENCOUNTER — Ambulatory Visit: Payer: Self-pay | Admitting: Nurse Practitioner

## 2024-05-23 ENCOUNTER — Other Ambulatory Visit: Payer: Self-pay

## 2024-05-25 ENCOUNTER — Other Ambulatory Visit: Payer: Self-pay

## 2024-05-25 ENCOUNTER — Ambulatory Visit
Admission: EM | Admit: 2024-05-25 | Discharge: 2024-05-25 | Disposition: A | Discharge status: Home / Self Care | Attending: Nurse Practitioner | Admitting: Nurse Practitioner

## 2024-05-25 DIAGNOSIS — R051 Acute cough: Secondary | ICD-10-CM | POA: Diagnosis not present

## 2024-05-25 DIAGNOSIS — K0889 Other specified disorders of teeth and supporting structures: Secondary | ICD-10-CM

## 2024-05-25 LAB — POC SOFIA SARS ANTIGEN FIA: SARS Coronavirus 2 Ag: NEGATIVE

## 2024-05-25 MED ORDER — CHLORHEXIDINE GLUCONATE 0.12 % MT SOLN: 15.0000 mL | OROMUCOSAL | 0 refills | Status: AC

## 2024-05-25 MED ORDER — PSEUDOEPH-BROMPHEN-DM 30-2-10 MG/5ML PO SYRP: 10.0000 mL | ORAL_SOLUTION | Freq: Four times a day (QID) | ORAL | 0 refills | Status: DC | PRN

## 2024-05-25 MED ORDER — AMOXICILLIN 875 MG PO TABS: 875.0000 mg | ORAL_TABLET | Freq: Two times a day (BID) | ORAL | 0 refills | Status: AC

## 2024-05-25 MED ORDER — IBUPROFEN 800 MG PO TABS: 800.0000 mg | ORAL_TABLET | Freq: Three times a day (TID) | ORAL | 0 refills | Status: AC | PRN

## 2024-05-25 MED ORDER — IPRATROPIUM-ALBUTEROL 0.5-2.5 (3) MG/3ML IN SOLN
3.0000 mL | Freq: Once | RESPIRATORY_TRACT | Status: AC
  Administered 2024-05-25: 3 mL via RESPIRATORY_TRACT

## 2024-05-25 NOTE — Discharge Instructions (Addendum)
 You were seen today for cough along with dental pain. Your exam and test results are reassuring. Your COVID test was negative, and there are no signs of a serious bacterial infection or heart or lung problem. Your symptoms are most likely due to a viral infection, which will get better with time and supportive care.  For your dental pain, you have a chipped upper tooth that could be at risk for infection. You were prescribed amoxicillin to help prevent or treat infection, as well as Peridex mouth rinse and Motrin  for pain relief. Do not take any other NSAIDs such as ibuprofen , naproxen, or products containing aspirin  while you are taking Motrin . You may take Tylenol  if you need more pain control.  Please keep your dental appointment on Monday for further evaluation and treatment of the tooth. For your cold symptoms, drink plenty of fluids, get rest, and use over-the-counter medicines such as Tylenol  or Motrin  for fever or discomfort if needed. Follow up with your primary care provider if your symptoms do not improve within one week or if new issues arise. Go to the emergency room right away if you develop trouble breathing, chest pain, high fever that will not go down, inability to drink fluids, confusion, or any sudden or worsening symptoms.

## 2024-05-25 NOTE — ED Provider Notes (Signed)
 [facility]    CSN: [id number] Arrival date & time: 05/25/24  1241      History   Chief Complaint Chief Complaint  Patient presents with   Cough   Dental Pain    HPI Amber Shaw is a 63 y.o. female.   Discussed the use of AI scribe software for clinical note transcription with the patient, who gave verbal consent to proceed.   Patient with a history of COPD presents with cold symptoms that began 3 days ago. The first two days were characterized by coughing that had clear sputum, but last night around 3 AM the patient woke up coughing with yellow sputum production. The patient reports experiencing hot and cold sensations without fever, along with runny nose and diarrhea. She's also been eating soft foods lately as she has a tooth that has been causing her pain. She is due to have the tooth removed in 5 days. This morning, the patient developed wheezing and shortness of breath. The patient has not been taking Advair  due to concerns about developing pneumonia but has used her albuterol  today. The patient experienced a mild headache that has since resolved. Potential exposure includes a nephew who was coughing, though the nephew reportedly has allergies. The patient denies sneezing, sore throat, nausea, vomiting, or current dizziness. The patient vapes but does not smoke cigarettes.   The following sections of the patient's history were reviewed and updated as appropriate: allergies, current medications, past family history, past medical history, past social history, past surgical history, and problem list.       Past Medical History:  Diagnosis Date   Acute combined systolic and diastolic heart failure (HCC) 11/05/2023   Arthritis    Asthma    Combined systolic and diastolic congestive heart failure (HCC) 11/05/2023   COPD (chronic obstructive pulmonary disease) (HCC)    Hypertension    Sleep apnea     Patient Active Problem List   Diagnosis Date Noted    Dyslipidemia 12/29/2023   Prediabetes 12/29/2023   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   Chronic combined systolic and diastolic CHF (congestive heart failure) (HCC) 11/05/2023   Pap smear for cervical cancer screening 08/14/2016   Colon cancer screening 08/14/2016   Gastroesophageal reflux disease 08/14/2016   COPD GOLD III 12/06/2014   Dyspnea 12/05/2014   Community acquired pneumonia 11/13/2014   CAP (community acquired pneumonia) 11/13/2014   Lower urinary tract infectious disease 11/13/2014   Candida UTI 03/20/2014   Preventative health care 03/20/2014   Essential hypertension, benign 04/21/2013   Chronic bronchitis (HCC) 04/21/2013   Nicotine dependence 04/21/2013    Past Surgical History:  Procedure Laterality Date   no prior surgery     RIGHT/LEFT HEART CATH AND CORONARY ANGIOGRAPHY N/A 11/09/2023   Procedure: RIGHT/LEFT HEART CATH AND CORONARY ANGIOGRAPHY;  Surgeon: Mady Bruckner, MD;  Location: [facility];  Service: Cardiovascular;  Laterality: N/A;    OB History     Gravida  2   Para      Term      Preterm      AB      Living  2      SAB      IAB      Ectopic      Multiple      Live Births               Home Medications    Prior to Admission medications   Medication Sig Start Date End Date  Taking? Authorizing Provider  albuterol  (VENTOLIN  HFA) 108 (90 Base) MCG/ACT inhaler Inhale 2 puffs into the lungs every 6 (six) hours as needed for wheezing or shortness of breath. 01/26/24  Yes Paseda, Folashade R, FNP  amoxicillin (AMOXIL) 875 MG tablet Take 1 tablet (875 mg total) by mouth 2 (two) times daily for 5 days. 05/25/24 05/30/24 Yes Iola Lukes, FNP  atorvastatin  (LIPITOR) 40 MG tablet Take 1 tablet (40 mg total) by mouth daily. 11/11/23  Yes Patsy Lenis, MD  brompheniramine-pseudoephedrine-DM 30-2-10 MG/5ML syrup Take 10 mLs by mouth every 6 (six) hours as needed (cough and congestion). 05/25/24  Yes Iola Lukes, FNP  carvedilol   (COREG ) 3.125 MG tablet Take 1 tablet (3.125 mg total) by mouth 2 (two) times daily with a meal. 11/10/23  Yes Patsy Lenis, MD  chlorhexidine (PERIDEX) 0.12 % solution Use as directed 15 mLs in the mouth or throat 2 (two) times daily at 8 am and 6 pm for 7 days. Brush teeth then swish in mouth for 2 minutes then spit out. Do not rinse mouth after use. Avoid eating, drinking, smoking and vaping for at least 30 minutes after use 05/25/24 06/01/24 Yes Jadier Rockers, FNP  fluticasone -salmeterol (ADVAIR  DISKUS) 250-50 MCG/ACT AEPB Inhale 1 puff into the lungs in the morning and at bedtime. Patient taking differently: Inhale 1 puff into the lungs in the morning and at bedtime. As needed 12/29/23  Yes Paseda, Folashade R, FNP  ibuprofen  (ADVIL ) 800 MG tablet Take 1 tablet (800 mg total) by mouth every 8 (eight) hours as needed (pain). Take with food to avoid stomach upset. Do not take any additional NSAIDs while on this. You may take tylenol  in addition to this if needed for extra pain relief. 05/25/24  Yes Iola Lukes, FNP  JARDIANCE  10 MG TABS tablet Take 1 tablet (10 mg total) by mouth daily before breakfast. 12/08/23  Yes Sabharwal, Aditya, DO  Magnesium  400 MG TABS Take 400 mg ( 1 tablet)  by mouth daily. Magnesium  TAURATE - patient aware she may have to purchase this online 02/09/24  Yes Mealor, Augustus E, MD  mexiletine (MEXITIL ) 150 MG capsule Take 1 capsule (150 mg total) by mouth 2 (two) times daily. 02/09/24  Yes Mealor, Augustus E, MD  omeprazole  (PRILOSEC) 40 MG capsule Take 1 capsule (40 mg total) by mouth daily. 12/29/23  Yes Paseda, Folashade R, FNP  sacubitril -valsartan  (ENTRESTO ) 24-26 MG Take 1 tablet by mouth 2 (two) times daily. 12/08/23  Yes Sabharwal, Aditya, DO  spironolactone  (ALDACTONE ) 25 MG tablet Take 1 tablet (25 mg total) by mouth daily. 02/03/24  Yes Sabharwal, Aditya, DO  torsemide  (DEMADEX ) 20 MG tablet Take 1 tablet (20 mg total) by mouth daily. 11/11/23  Yes Patsy Lenis, MD   potassium chloride  SA (KLOR-CON  M) 20 MEQ tablet Take 4 tablets ( ) when you pick up medication, and then take 2 tablets (40meq) tonight. Patient not taking: Reported on 05/25/2024 02/03/24   Gardenia Led, DO    Family History Family History  Problem Relation Age of Onset   Cancer Mother    Hypertension Father    Heart disease Father        Rare heart disease  Died age 17   Colon cancer Maternal Grandfather 60   Cancer Other    Diabetes Other    Hyperlipidemia Other    Stroke Other     Social History Social History   Tobacco Use   Smoking status: Former    Current packs/day: 0.00  Average packs/day: 0.3 packs/day for 18.0 years (4.5 ttl pk-yrs)    Types: Cigarettes    Start date: 11/12/1996    Quit date: 11/13/2014    Years since quitting: 9.5   Smokeless tobacco: Never  Vaping Use   Vaping status: Former   Substances: Flavoring  Substance Use Topics   Alcohol use: No    Alcohol/week: 0.0 standard drinks of alcohol   Drug use: No     Allergies   Percocet [oxycodone-acetaminophen ]   Review of Systems Review of Systems  Constitutional:  Positive for diaphoresis (hot and cold). Negative for appetite change (only able to eat soft foods due to dental pain) and fever.  HENT:  Positive for congestion, dental problem and rhinorrhea. Negative for facial swelling, sneezing and sore throat.   Respiratory:  Positive for cough (initially clear sputum but now yellow), shortness of breath (onset this AM) and wheezing (onset this AM).   Gastrointestinal:  Positive for diarrhea. Negative for nausea and vomiting.  Neurological:  Positive for headaches (mild initially but not currently). Negative for dizziness.  All other systems reviewed and are negative.    Physical Exam Triage Vital Signs ED Triage Vitals  Encounter Vitals Group     BP 05/25/24 1344 137/64     Girls Systolic BP Percentile --      Girls Diastolic BP Percentile --      Boys Systolic BP  Percentile --      Boys Diastolic BP Percentile --      Pulse Rate 05/25/24 1344 74     Resp 05/25/24 1344 (!) 24     Temp 05/25/24 1344 98.2 F (36.8 C)     Temp Source 05/25/24 1344 Oral     SpO2 05/25/24 1344 97 %     Weight --      Height --      Head Circumference --      Peak Flow --      Pain Score 05/25/24 1341 8     Pain Loc --      Pain Education --      Exclude from Growth Chart --    No data found.  Updated Vital Signs BP 137/64 (BP Location: Left Arm)   Pulse 74   Temp 98.2 F (36.8 C) (Oral)   Resp (!) 24   LMP 05/16/2013   SpO2 97%   Visual Acuity Right Eye Distance:   Left Eye Distance:   Bilateral Distance:    Right Eye Near:   Left Eye Near:    Bilateral Near:     Physical Exam Vitals reviewed.  Constitutional:      General: She is awake. She is not in acute distress.    Appearance: Normal appearance. She is well-developed. She is not ill-appearing, toxic-appearing or diaphoretic.  HENT:     Head: Normocephalic.     Right Ear: Tympanic membrane, ear canal and external ear normal. No drainage, swelling or tenderness. No middle ear effusion. Tympanic membrane is not erythematous.     Left Ear: Tympanic membrane, ear canal and external ear normal. No drainage, swelling or tenderness.  No middle ear effusion. Tympanic membrane is not erythematous.     Nose: No congestion or rhinorrhea.     Mouth/Throat:     Lips: Pink.     Mouth: Mucous membranes are moist.     Dentition: Dental tenderness present. No gingival swelling, dental caries, dental abscesses or gum lesions.     Pharynx: No pharyngeal  swelling, oropharyngeal exudate, posterior oropharyngeal erythema or uvula swelling.     Tonsils: No tonsillar exudate or tonsillar abscesses.      Comments: Chipped left upper lateral incisor without any surrounding gingival swelling or erythema. No obvious decay noted.  Eyes:     General: Vision grossly intact.     Conjunctiva/sclera: Conjunctivae normal.   Cardiovascular:     Rate and Rhythm: Normal rate.     Heart sounds: Normal heart sounds.  Pulmonary:     Effort: Pulmonary effort is normal. No tachypnea or respiratory distress.     Breath sounds: Normal breath sounds and air entry.     Comments: Diffused mild expiratory wheezing noted throughout all lung fields. Respirations even and unlabored. No acute distress noted.  Musculoskeletal:        General: Normal range of motion.     Cervical back: Normal range of motion and neck supple.  Lymphadenopathy:     Cervical: No cervical adenopathy.  Skin:    General: Skin is warm and dry.  Neurological:     General: No focal deficit present.     Mental Status: She is alert and oriented to person, place, and time.  Psychiatric:        Behavior: Behavior is cooperative.      UC Treatments / Results  Labs (all labs ordered are listed, but only abnormal results are displayed) Labs Reviewed  POC SOFIA SARS ANTIGEN FIA - Normal    EKG   Radiology No results found.  Procedures Procedures (including critical care time)  Medications Ordered in UC Medications  ipratropium-albuterol  (DUONEB) 0.5-2.5 (3) MG/3ML nebulizer solution 3 mL (3 mLs Nebulization Given 05/25/24 1408)    Initial Impression / Assessment and Plan / UC Course  I have reviewed the triage vital signs and the nursing notes.  Pertinent labs & imaging results that were available during my care of the patient were reviewed by me and considered in my medical decision making (see chart for details).    The patient with a history of COVID presents with symptoms consistent with a viral upper respiratory infection. A COVID test was obtained and was negative. Examination is overall reassuring with no findings suggestive of bacterial infection or acute cardiopulmonary process. In addition, the patient reports dental pain. Oral exam reveals a chipped left upper lateral incisor without surrounding gingival erythema, swelling, or  obvious decay. Given symptoms and risk of early infection, amoxicillin was prescribed along with Peridex mouth rinse and Motrin  for pain control. The patient was advised to avoid NSAIDs or any products containing aspirin  while taking Motrin . Acetaminophen  may be used as an additional option for pain relief if needed. Supportive measures for viral URI were reviewed, including hydration, rest, and symptomatic care. Patient was instructed to keep her scheduled dental appointment on Monday for further evaluation. She was advised to follow up with her primary care provider if symptoms persist or fail to improve within one week. Emergency precautions were reviewed, including return for worsening respiratory symptoms, chest pain, persistent high fever, inability to tolerate fluids, confusion, or other concerning changes.  Today's evaluation has revealed no signs of a dangerous process. Discussed diagnosis with patient and/or guardian. Patient and/or guardian aware of their diagnosis, possible red flag symptoms to watch out for and need for close follow up. Patient and/or guardian understands verbal and written discharge instructions. Patient and/or guardian comfortable with plan and disposition.  Patient and/or guardian has a clear mental status at this time, good insight  into illness (after discussion and teaching) and has clear judgment to make decisions regarding their care  Documentation was completed with the aid of voice recognition software. Transcription may contain typographical errors.   Final Clinical Impressions(s) / UC Diagnoses   Final diagnoses:  Acute cough  Dentalgia     Discharge Instructions      You were seen today for cough along with dental pain. Your exam and test results are reassuring. Your COVID test was negative, and there are no signs of a serious bacterial infection or heart or lung problem. Your symptoms are most likely due to a viral infection, which will get better with  time and supportive care.  For your dental pain, you have a chipped upper tooth that could be at risk for infection. You were prescribed amoxicillin to help prevent or treat infection, as well as Peridex mouth rinse and Motrin  for pain relief. Do not take any other NSAIDs such as ibuprofen , naproxen, or products containing aspirin  while you are taking Motrin . You may take Tylenol  if you need more pain control.  Please keep your dental appointment on Monday for further evaluation and treatment of the tooth. For your cold symptoms, drink plenty of fluids, get rest, and use over-the-counter medicines such as Tylenol  or Motrin  for fever or discomfort if needed. Follow up with your primary care provider if your symptoms do not improve within one week or if new issues arise. Go to the emergency room right away if you develop trouble breathing, chest pain, high fever that will not go down, inability to drink fluids, confusion, or any sudden or worsening symptoms.     ED Prescriptions     Medication Sig Dispense Auth. Provider   amoxicillin (AMOXIL) 875 MG tablet Take 1 tablet (875 mg total) by mouth 2 (two) times daily for 5 days. 10 tablet Iola Lukes, FNP   chlorhexidine (PERIDEX) 0.12 % solution Use as directed 15 mLs in the mouth or throat 2 (two) times daily at 8 am and 6 pm for 7 days. Brush teeth then swish in mouth for 2 minutes then spit out. Do not rinse mouth after use. Avoid eating, drinking, smoking and vaping for at least 30 minutes after use 210 mL Moe Graca, FNP   ibuprofen  (ADVIL ) 800 MG tablet Take 1 tablet (800 mg total) by mouth every 8 (eight) hours as needed (pain). Take with food to avoid stomach upset. Do not take any additional NSAIDs while on this. You may take tylenol  in addition to this if needed for extra pain relief. 21 tablet Iola Lukes, FNP   brompheniramine-pseudoephedrine-DM 30-2-10 MG/5ML syrup Take 10 mLs by mouth every 6 (six) hours as needed (cough  and congestion). 120 mL Iola Lukes, FNP      PDMP not reviewed this encounter.   Iola Lukes, [place] 05/25/24 1444

## 2024-05-25 NOTE — ED Triage Notes (Signed)
 Pt reports cough this morning. Feeling sickly yesterday. Taking theraflu. Also has COPD and has been wheezing today.  Also c/o left upper tooth pain, worse when she tries to eat. States has a broken tooth which is to be extracted on Monday. Needs pain med until then.

## 2024-05-31 ENCOUNTER — Other Ambulatory Visit: Payer: Self-pay

## 2024-06-01 ENCOUNTER — Other Ambulatory Visit: Payer: Self-pay

## 2024-06-05 DIAGNOSIS — Z419 Encounter for procedure for purposes other than remedying health state, unspecified: Secondary | ICD-10-CM | POA: Diagnosis not present

## 2024-06-07 ENCOUNTER — Encounter: Admitting: Genetic Counselor

## 2024-06-09 ENCOUNTER — Other Ambulatory Visit: Payer: Self-pay

## 2024-06-10 ENCOUNTER — Other Ambulatory Visit: Payer: Self-pay

## 2024-06-26 NOTE — Progress Notes (Deleted)
 Cardiology Office Note:  .   Date:  06/26/2024  ID:  Amber Shaw, DOB 04/25/1961, MRN [id number] PCP: Paseda, Folashade R, FNP  [facility] Providers Cardiologist:  Lynwood Schilling, MD Electrophysiologist:  Eulas FORBES Furbish, MD  Advanced Heart Failure:  Ria Commander, DO {  History of Present Illness: .   Amber Shaw is a 63 y.o. female w/PMHx of  COPD, HTN, OSA (w/CPAP), GERD NICM (TTN cardiomyopathy) (family history of sudden cardiac death, father in 68s, sister in late 42s) PVCs  March 2018 when she was admitted to [facility] long with shortness of breath; echocardiogram with LVEF of 30% at that time with moderately reduced RV function. Patient was diuresed and sent to [facility] where right and left heart catheterization demonstrated nonobstructive CAD and right heart cath with normal filling pressures and cardiac index of 2.3 L/min/m. During admission and procedures patient was noted to have frequent PVCs.   She saw Dr. Commander 02/03/24, doing well, active without volume issues, no palpitations, though noted frequent PVCs on her EKG and referred back to EP Labs done this day >> hypokalemic and her meds adjusted  She saw Dr. Furbish 02/09/24, discussed monitor March 2025 with PVC burden of 23.4% On his review noted 3 different morphologies (one dominant), this would make ablation difficult, started on mexiletine and magnesium  Taurate C.MRI with LVEF 39% >> no longer in range for ICD.  6/16: K+ better at 4.3  Today's visit is scheduled as a 3 mo f/u ROS:   *** tol mex? *** f/u on K+, check mag *** symptoms *** volume *** due for AHF Nov as well >> new MD    Arrhythmia/AAD hx Mexiletine started June 2025 for PVCs  Studies Reviewed: SABRA    EKG done today and reviewed by myself:  ***  Cardiac MRI Jan 21, 2024 Difficult images due to frequent PVCs.  Diffuse LV hypokinesis, EF 39%.  RVEF 42%.  Mid wall LGE stripe in the basal septum.  14 day monitor March  2025-- my interpretation Sinus rhythm 47 to 133 bpm, average 75 23.4% PVCs.  Multiple PVC morphologies were seen, but there was 1 dominant morphology comprising 22.9% of the 23.4%  There were episodes of wide-complex tachycardia up to 5.4 seconds in duration  ECHO: 11/06/23: LVEF 30% As per my personal interpretation   CATH: 11/09/2023: Mild, non-obstructive coronary artery disease consistent with nonischemic cardiomyopathy. Normal left heart, right heart, and pulmonary artery pressures. Low normal to mildly reduced Fick cardiac output/index. Frequent PVC's noted throughout procedure.   Risk Assessment/Calculations:    Physical Exam:   VS:  LMP 05/16/2013    Wt Readings from Last 3 Encounters:  02/09/24 161 lb 8 oz (73.3 kg)  02/03/24 164 lb 3.2 oz (74.5 kg)  12/29/23 166 lb (75.3 kg)    GEN: Well nourished, well developed in no acute distress NECK: No JVD; No carotid bruits CARDIAC: ***RRR, no murmurs, rubs, gallops RESPIRATORY:  *** CTA b/l without rales, wheezing or rhonchi  ABDOMEN: Soft, non-tender, non-distended EXTREMITIES: *** No edema; No deformity   ASSESSMENT AND PLAN: .    PVCs On mexiletine/mag Hx of hypokalemia *** *** update labs/lytes   NICM (described as TTR cardiomyopathy) *** C/w AHF team       {Are you ordering a CV Procedure (e.g. stress test, cath, DCCV, TEE, etc)?   Press F2        :[id number]}     Dispo: ***  Signed, Charlies Macario Arthur, PA-C

## 2024-06-27 ENCOUNTER — Ambulatory Visit: Attending: Physician Assistant | Admitting: Physician Assistant

## 2024-06-27 ENCOUNTER — Ambulatory Visit: Payer: Self-pay | Admitting: Nurse Practitioner

## 2024-06-29 ENCOUNTER — Encounter: Payer: Self-pay | Admitting: Physician Assistant

## 2024-07-01 ENCOUNTER — Other Ambulatory Visit: Payer: Self-pay

## 2024-07-01 ENCOUNTER — Ambulatory Visit (INDEPENDENT_AMBULATORY_CARE_PROVIDER_SITE_OTHER): Payer: Self-pay | Admitting: Nurse Practitioner

## 2024-07-01 VITALS — BP 107/68 | HR 74 | Ht 65.0 in | Wt 161.0 lb

## 2024-07-01 DIAGNOSIS — R7303 Prediabetes: Secondary | ICD-10-CM

## 2024-07-01 DIAGNOSIS — E785 Hyperlipidemia, unspecified: Secondary | ICD-10-CM | POA: Diagnosis not present

## 2024-07-01 DIAGNOSIS — I5042 Chronic combined systolic (congestive) and diastolic (congestive) heart failure: Secondary | ICD-10-CM | POA: Diagnosis not present

## 2024-07-01 DIAGNOSIS — H538 Other visual disturbances: Secondary | ICD-10-CM | POA: Insufficient documentation

## 2024-07-01 DIAGNOSIS — J449 Chronic obstructive pulmonary disease, unspecified: Secondary | ICD-10-CM | POA: Diagnosis not present

## 2024-07-01 DIAGNOSIS — Z1231 Encounter for screening mammogram for malignant neoplasm of breast: Secondary | ICD-10-CM

## 2024-07-01 DIAGNOSIS — I10 Essential (primary) hypertension: Secondary | ICD-10-CM | POA: Diagnosis not present

## 2024-07-01 DIAGNOSIS — I493 Ventricular premature depolarization: Secondary | ICD-10-CM

## 2024-07-01 MED ORDER — ROSUVASTATIN CALCIUM 10 MG PO TABS
10.0000 mg | ORAL_TABLET | Freq: Every day | ORAL | 1 refills | Status: DC
  Filled 2024-07-01: qty 60, 60d supply, fill #0

## 2024-07-01 NOTE — Assessment & Plan Note (Signed)
 Ventricular arrhythmia (frequent PVCs) Mexiletin prescribed. Reports adherence to medication regimen but I doubt this - Encouraged to take mexiletin 150 mg twice daily as prescribed.

## 2024-07-01 NOTE — Assessment & Plan Note (Signed)
 Patient complains of blurry vision Referral to ophthalmologist placed

## 2024-07-01 NOTE — Progress Notes (Signed)
 Established Patient Office Visit  Subjective:  Patient ID: Amber Shaw, female    DOB: Feb 05, 1961  Age: 63 y.o. MRN: [id number]  CC:  Chief Complaint  Patient presents with   Follow-up    HPI   Discussed the use of AI scribe software for clinical note transcription with the patient, who gave verbal consent to proceed.  History of Present Illness Amber Shaw is a 63 year old female  has a past medical history of Acute combined systolic and diastolic heart failure (HCC) (96/86/7974), Arthritis, Asthma, Combined systolic and diastolic congestive heart failure (HCC) (11/05/2023), COPD (chronic obstructive pulmonary disease) (HCC), Hypertension, and Sleep apnea.  Patient presents for follow-up for her chronic medical conditions  She experiences significant side effects from her prescribed medications, particularly from atorvastatin   that makes her 'nonfunctional'. She continues to take Jardiance  daily, spironolactone  once daily, torsemide  daily, and carvedilol  twice daily. She has stopped taking Entresto  regularly due to feeling unwell. She also  stated that she takes Mexiletin for ventricular arrhythmia, which was started in June for frequent PVCs, but the medication has not been refilled since June  She takes over-the-counter supplements such as magnesium  400 mg daily, vitamin D , soursop, and elderberry. She uses her albuterol   inhaler once a day due to increased activity from moving into a new house and doing furniture arrangements.  She has a history of prediabetes. 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  No chest pain, shortness of breath, or history of stroke. She is active, engaging in walking and housework, and is currently decorating her new home.  She has not had a flu shot due to previous adverse reactions. She had a mammogram in February of this year, which was normal, and a colonoscopy within the past ten years.    Assessment &  Plan          Past Medical History:  Diagnosis Date   Acute combined systolic and diastolic heart failure (HCC) 11/05/2023   Arthritis    Asthma    Combined systolic and diastolic congestive heart failure (HCC) 11/05/2023   COPD (chronic obstructive pulmonary disease) (HCC)    Hypertension    Sleep apnea     Past Surgical History:  Procedure Laterality Date   no prior surgery     RIGHT/LEFT HEART CATH AND CORONARY ANGIOGRAPHY N/A 11/09/2023   Procedure: RIGHT/LEFT HEART CATH AND CORONARY ANGIOGRAPHY;  Surgeon: Mady Bruckner, MD;  Location: [facility] INVASIVE [facility];  Service: Cardiovascular;  Laterality: N/A;    Family History  Problem Relation Age of Onset   Cancer Mother    Hypertension Father    Heart disease Father        Rare heart disease  Died age 74   Colon cancer Maternal Grandfather 88   Cancer Other    Diabetes Other    Hyperlipidemia Other    Stroke Other     Social History   Socioeconomic History   Marital status: Single    Spouse name: Not on file   Number of children: 2   Years of education: Not on file   Highest education level: 12th grade  Occupational History   Occupation: Not working  Tobacco Use   Smoking status: Former    Current packs/day: 0.00    Average packs/day: 0.3 packs/day for 18.0 years (4.5 ttl pk-yrs)    Types: Cigarettes    Start date: 11/12/1996    Quit date: 11/13/2014  Years since quitting: 9.6   Smokeless tobacco: Never  Vaping Use   Vaping status: Former   Substances: Flavoring  Substance and Sexual Activity   Alcohol use: No    Alcohol/week: 0.0 standard drinks of alcohol   Drug use: No   Sexual activity: Not on file  Other Topics Concern   Not on file  Social History Narrative   Lives alone.     Two children.     Social Drivers of Health   Financial Resource Strain: High Risk (07/01/2024)   Overall Financial Resource Strain (CARDIA)    Difficulty of Paying Living Expenses: Hard  Food Insecurity: Food  Insecurity Present (07/01/2024)   Hunger Vital Sign    Worried About Running Out of Food in the Last Year: Sometimes true    Ran Out of Food in the Last Year: Sometimes true  Transportation Needs: Unmet Transportation Needs (07/01/2024)   PRAPARE - Administrator, Civil Service (Medical): Yes    Lack of Transportation (Non-Medical): No  Physical Activity: Insufficiently Active (07/01/2024)   Exercise Vital Sign    Days of Exercise per Week: 4 days    Minutes of Exercise per Session: 30 min  Stress: Stress Concern Present (07/01/2024)   [employer] of Occupational Health - Occupational Stress Questionnaire    Feeling of Stress: To some extent  Social Connections: Unknown (07/01/2024)   Social Connection and Isolation Panel    Frequency of Communication with Friends and Family: More than three times a week    Frequency of Social Gatherings with Friends and Family: Once a week    Attends Religious Services: Patient declined    Database Administrator or Organizations: No    Attends Engineer, Structural: Not on file    Marital Status: Patient declined  Intimate Partner Violence: Not At Risk (11/05/2023)   Humiliation, Afraid, Rape, and Kick questionnaire    Fear of Current or Ex-Partner: No    Emotionally Abused: No    Physically Abused: No    Sexually Abused: No    Outpatient Medications Prior to Visit  Medication Sig Dispense Refill   albuterol  (VENTOLIN  HFA) 108 (90 Base) MCG/ACT inhaler Inhale 2 puffs into the lungs every 6 (six) hours as needed for wheezing or shortness of breath. 18 g 0   carvedilol  (COREG ) 3.125 MG tablet Take 1 tablet (3.125 mg total) by mouth 2 (two) times daily with a meal. 180 tablet 3   fluticasone -salmeterol (ADVAIR  DISKUS) 250-50 MCG/ACT AEPB Inhale 1 puff into the lungs in the morning and at bedtime. 60 each 3   ibuprofen  (ADVIL ) 800 MG tablet Take 1 tablet (800 mg total) by mouth every 8 (eight) hours as needed (pain). Take with food  to avoid stomach upset. Do not take any additional NSAIDs while on this. You may take tylenol  in addition to this if needed for extra pain relief. 21 tablet 0   JARDIANCE  10 MG TABS tablet Take 1 tablet (10 mg total) by mouth daily before breakfast. 30 tablet 11   Magnesium  400 MG TABS Take 400 mg ( 1 tablet)  by mouth daily. Magnesium  TAURATE - patient aware she may have to purchase this online 90 tablet 3   mexiletine (MEXITIL ) 150 MG capsule Take 1 capsule (150 mg total) by mouth 2 (two) times daily. 60 capsule 11   omeprazole  (PRILOSEC) 40 MG capsule Take 1 capsule (40 mg total) by mouth daily. 30 capsule 3   sacubitril -valsartan  (ENTRESTO ) 24-26  MG Take 1 tablet by mouth 2 (two) times daily. 60 tablet 11   spironolactone  (ALDACTONE ) 25 MG tablet Take 1 tablet (25 mg total) by mouth daily. 30 tablet 5   torsemide  (DEMADEX ) 20 MG tablet Take 1 tablet (20 mg total) by mouth daily. 90 tablet 3   brompheniramine-pseudoephedrine-DM 30-2-10 MG/5ML syrup Take 10 mLs by mouth every 6 (six) hours as needed (cough and congestion). (Patient not taking: Reported on 07/01/2024) 120 mL 0   atorvastatin  (LIPITOR) 40 MG tablet Take 1 tablet (40 mg total) by mouth daily. (Patient not taking: Reported on 07/01/2024) 90 tablet 3   potassium chloride  SA (KLOR-CON  M) 20 MEQ tablet Take 4 tablets ( ) when you pick up medication, and then take 2 tablets (40meq) tonight. (Patient not taking: Reported on 05/25/2024) 6 tablet 0   No facility-administered medications prior to visit.    Allergies  Allergen Reactions   Percocet [Oxycodone-Acetaminophen ] Other (See Comments)    Painful urination     ROS Review of Systems  Constitutional:  Negative for appetite change, chills, fatigue and fever.  HENT:  Negative for congestion, postnasal drip, rhinorrhea and sneezing.   Respiratory:  Negative for cough, shortness of breath and wheezing.   Cardiovascular:  Negative for chest pain, palpitations and leg swelling.   Gastrointestinal:  Negative for abdominal pain, constipation, nausea and vomiting.  Genitourinary:  Negative for difficulty urinating, dysuria, flank pain and frequency.  Musculoskeletal:  Negative for arthralgias, back pain, joint swelling and myalgias.  Skin:  Negative for color change, pallor, rash and wound.  Neurological:  Negative for dizziness, facial asymmetry, weakness, numbness and headaches.  Psychiatric/Behavioral:  Negative for behavioral problems, confusion, self-injury and suicidal ideas.       Objective:    Physical Exam Vitals and nursing note reviewed.  Constitutional:      General: She is not in acute distress.    Appearance: Normal appearance. She is not ill-appearing, toxic-appearing or diaphoretic.  Eyes:     General: No scleral icterus.       Right eye: No discharge.        Left eye: No discharge.     Extraocular Movements: Extraocular movements intact.     Conjunctiva/sclera: Conjunctivae normal.  Cardiovascular:     Rate and Rhythm: Normal rate and regular rhythm.     Pulses: Normal pulses.     Heart sounds: Normal heart sounds. No murmur heard.    No friction rub. No gallop.  Pulmonary:     Effort: Pulmonary effort is normal. No respiratory distress.     Breath sounds: Normal breath sounds. No stridor. No wheezing, rhonchi or rales.  Chest:     Chest wall: No tenderness.  Abdominal:     General: There is no distension.     Palpations: Abdomen is soft.     Tenderness: There is no abdominal tenderness. There is no right CVA tenderness, left CVA tenderness or guarding.  Musculoskeletal:        General: No swelling, tenderness, deformity or signs of injury.     Right lower leg: No edema.     Left lower leg: No edema.  Skin:    General: Skin is warm and dry.     Capillary Refill: Capillary refill takes less than 2 seconds.     Coloration: Skin is not jaundiced or pale.     Findings: No bruising, erythema or lesion.  Neurological:     Mental Status:  She is alert and oriented to  person, place, and time.     Motor: No weakness.     Gait: Gait normal.  Psychiatric:        Mood and Affect: Mood normal.        Behavior: Behavior normal.        Thought Content: Thought content normal.        Judgment: Judgment normal.     BP 107/68   Pulse 74   Ht 5' 5 (1.651 m)   Wt 161 lb (73 kg)   LMP 05/16/2013   SpO2 98%   BMI 26.79 kg/m  Wt Readings from Last 3 Encounters:  07/01/24 161 lb (73 kg)  02/09/24 161 lb 8 oz (73.3 kg)  02/03/24 164 lb 3.2 oz (74.5 kg)    Lab Results  Component Value Date   TSH 0.850 11/07/2023   Lab Results  Component Value Date   WBC 4.3 11/10/2023   HGB 15.2 (H) 11/10/2023   HCT 47.5 (H) 11/10/2023   MCV 92.2 11/10/2023   PLT 237 11/10/2023   Lab Results  Component Value Date   NA 146 (H) 02/08/2024   K 4.3 02/08/2024   CO2 32 02/08/2024   GLUCOSE 164 (H) 02/08/2024   BUN 16 02/08/2024   CREATININE 1.16 (H) 02/08/2024   BILITOT 1.0 11/08/2023   ALKPHOS 58 11/08/2023   AST 17 11/08/2023   ALT 44 11/08/2023   PROT 6.6 11/08/2023   ALBUMIN 3.4 (L) 11/08/2023   CALCIUM  9.5 02/08/2024   ANIONGAP 14 02/08/2024   Lab Results  Component Value Date   CHOL 194 12/29/2023   Lab Results  Component Value Date   HDL 62 12/29/2023   Lab Results  Component Value Date   LDLCALC 112 (H) 12/29/2023   Lab Results  Component Value Date   TRIG 111 12/29/2023   Lab Results  Component Value Date   CHOLHDL 3.1 12/29/2023   Lab Results  Component Value Date   HGBA1C 5.8 (H) 11/06/2023      Assessment & Plan:   Problem List Items Addressed This Visit       Cardiovascular and Mediastinum   Essential hypertension, benign - Primary   BP Readings from Last 3 Encounters:  07/01/24 107/68  05/25/24 137/64  02/09/24 118/81  On spironolactone  25 mg daily, torsemide  20 mg daily, carvedilol  3.125 mg twice daily, has Entresto  ordered but not taking medication Discussed DASH diet and dietary  sodium restrictions Continue to increase dietary efforts and exercise.         Relevant Medications   rosuvastatin (CRESTOR) 10 MG tablet   Other Relevant Orders   Basic Metabolic Panel   Magnesium    Frequent PVCs   Ventricular arrhythmia (frequent PVCs) Mexiletin prescribed. Reports adherence to medication regimen but I doubt this - Encouraged to take mexiletin 150 mg twice daily as prescribed.       Relevant Medications   rosuvastatin (CRESTOR) 10 MG tablet   Chronic combined systolic and diastolic CHF (congestive heart failure) (HCC)   Chronic combined systolic and diastolic heart failure Managed with multiple medications. Reports non-adherence to Entresto  due to adverse effects. Blood pressure well-controlled with current regimen. - Continue current heart failure medications: Jardiance  10 mg daily, carvedilol  3.125 mg twice daily, spironolactone  25 mg daily, torsemide  20 mg daily. - Advised to discuss adverse effects of Entresto  with cardiologist. - Encouraged adherence to medication regimen.       Relevant Medications   rosuvastatin (CRESTOR) 10 MG tablet  Respiratory   COPD GOLD III   COPD managed with albuterol  inhaler. Reports reduced need for inhaler use due to improved activity tolerance. - Continue albuterol  inhaler as needed. Not taking Advair  as prescribed states that she only takes it as needed and she feels fine, I encouraged the patient to consider taking Advair  1 puff twice daily as prescribed to prevent prevent symptoms and keep COPD under control          Other   Dyslipidemia   Lab Results  Component Value Date   CHOL 194 12/29/2023   HDL 62 12/29/2023   LDLCALC 112 (H) 12/29/2023   TRIG 111 12/29/2023   CHOLHDL 3.1 12/29/2023   Managed with atorvastatin , which causes significant adverse effects. Plan to switch to Crestor due to intolerance to current medication. - Discontinued atorvastatin . - Started Crestor 10 mg daily. - Ordered fasting  lipid panel next week. - Scheduled follow-up in two months to assess response to Crestor.       Relevant Medications   rosuvastatin (CRESTOR) 10 MG tablet   Other Relevant Orders   Lipid panel   Prediabetes   On Jardiance  10 mg daily for heart failure Patient counseled on low-carb diet Lab Results  Component Value Date   HGBA1C 5.8 (H) 11/06/2023         Relevant Medications   rosuvastatin (CRESTOR) 10 MG tablet   Other Relevant Orders   Hemoglobin A1c   Blurry vision, bilateral   Patient complains of blurry vision Referral to ophthalmologist placed      Relevant Orders   Ambulatory referral to Ophthalmology   Other Visit Diagnoses       Screening mammogram for breast cancer       Relevant Orders   MM 3D SCREENING MAMMOGRAM BILATERAL BREAST       Meds ordered this encounter  Medications   rosuvastatin (CRESTOR) 10 MG tablet    Sig: Take 1 tablet (10 mg total) by mouth daily.    Dispense:  60 tablet    Refill:  1    Follow-up: Return in about 2 months (around 08/31/2024) for HYPERLIPIDEMIA, FASTING LABS THIS WEEK.    Rynell Ciotti R Atia Haupt, FNP

## 2024-07-01 NOTE — Patient Instructions (Signed)
 Please call [phone]   to schedule your mammogram.  [facility]. [address] [place], [place] [address]. [place] .      . Dyslipidemia  - rosuvastatin (CRESTOR) 10 MG tablet; Take 1 tablet (10 mg total) by mouth daily.  Dispense: 60 tablet; Refill: 1 - Lipid panel; Future  . Screening mammogram for breast cancer  - MM 3D SCREENING MAMMOGRAM BILATERAL BREAST; Future    It is important that you exercise regularly at least 30 minutes 5 times a week as tolerated  Think about what you will eat, plan ahead. Choose  clean, green, fresh or frozen over canned, processed or packaged foods which are more sugary, salty and fatty. 70 to 75% of food eaten should be vegetables and fruit. Three meals at set times with snacks allowed between meals, but they must be fruit or vegetables. Aim to eat over a 12 hour period , example 7 am to 7 pm, and STOP after  your last meal of the day. Drink water,generally about 64 ounces per day, no other drink is as healthy. Fruit juice is best enjoyed in a healthy way, by EATING the fruit.  Thanks for choosing [facility] we consider it a privelige to serve you.

## 2024-07-01 NOTE — Assessment & Plan Note (Addendum)
 COPD managed with albuterol  inhaler. Reports reduced need for inhaler use due to improved activity tolerance. - Continue albuterol  inhaler as needed. Not taking Advair  as prescribed states that she only takes it as needed and she feels fine, I encouraged the patient to consider taking Advair  1 puff twice daily as prescribed to prevent prevent symptoms and keep COPD under control

## 2024-07-01 NOTE — Assessment & Plan Note (Signed)
 BP Readings from Last 3 Encounters:  07/01/24 107/68  05/25/24 137/64  02/09/24 118/81  On spironolactone  25 mg daily, torsemide  20 mg daily, carvedilol  3.125 mg twice daily, has Entresto  ordered but not taking medication Discussed DASH diet and dietary sodium restrictions Continue to increase dietary efforts and exercise.

## 2024-07-01 NOTE — Assessment & Plan Note (Signed)
 On Jardiance  10 mg daily for heart failure Patient counseled on low-carb diet Lab Results  Component Value Date   HGBA1C 5.8 (H) 11/06/2023

## 2024-07-01 NOTE — Assessment & Plan Note (Signed)
 Chronic combined systolic and diastolic heart failure Managed with multiple medications. Reports non-adherence to Entresto  due to adverse effects. Blood pressure well-controlled with current regimen. - Continue current heart failure medications: Jardiance  10 mg daily, carvedilol  3.125 mg twice daily, spironolactone  25 mg daily, torsemide  20 mg daily. - Advised to discuss adverse effects of Entresto  with cardiologist. - Encouraged adherence to medication regimen.

## 2024-07-01 NOTE — Assessment & Plan Note (Signed)
 Lab Results  Component Value Date   CHOL 194 12/29/2023   HDL 62 12/29/2023   LDLCALC 112 (H) 12/29/2023   TRIG 111 12/29/2023   CHOLHDL 3.1 12/29/2023   Managed with atorvastatin , which causes significant adverse effects. Plan to switch to Crestor due to intolerance to current medication. - Discontinued atorvastatin . - Started Crestor 10 mg daily. - Ordered fasting lipid panel next week. - Scheduled follow-up in two months to assess response to Crestor.

## 2024-07-05 ENCOUNTER — Encounter: Payer: Self-pay | Admitting: Nurse Practitioner

## 2024-07-05 ENCOUNTER — Ambulatory Visit (INDEPENDENT_AMBULATORY_CARE_PROVIDER_SITE_OTHER): Admitting: Nurse Practitioner

## 2024-07-05 ENCOUNTER — Other Ambulatory Visit (HOSPITAL_COMMUNITY)
Admission: RE | Admit: 2024-07-05 | Discharge: 2024-07-05 | Disposition: A | Discharge status: Home / Self Care | Source: Ambulatory Visit | Attending: Nurse Practitioner | Admitting: Nurse Practitioner

## 2024-07-05 VITALS — BP 112/82 | HR 68 | Wt 165.0 lb

## 2024-07-05 DIAGNOSIS — I10 Essential (primary) hypertension: Secondary | ICD-10-CM

## 2024-07-05 DIAGNOSIS — R7303 Prediabetes: Secondary | ICD-10-CM | POA: Diagnosis not present

## 2024-07-05 DIAGNOSIS — Z124 Encounter for screening for malignant neoplasm of cervix: Secondary | ICD-10-CM | POA: Insufficient documentation

## 2024-07-05 DIAGNOSIS — E785 Hyperlipidemia, unspecified: Secondary | ICD-10-CM | POA: Diagnosis not present

## 2024-07-05 DIAGNOSIS — N941 Unspecified dyspareunia: Secondary | ICD-10-CM | POA: Insufficient documentation

## 2024-07-05 DIAGNOSIS — Z113 Encounter for screening for infections with a predominantly sexual mode of transmission: Secondary | ICD-10-CM | POA: Diagnosis not present

## 2024-07-05 NOTE — Assessment & Plan Note (Addendum)
 Postcoital bleeding and dyspareunia Intermittent postcoital bleeding and dyspareunia for years. - Screened for STDs. - Referred to gynecologist for further evaluation. Pelvic ultrasound ordered

## 2024-07-05 NOTE — Assessment & Plan Note (Signed)
 General gynecologic screening (Pap smear) Pap smear overdue since December 2017. Screening needed for cervical abnormalities. - Performed Pap smear today.

## 2024-07-05 NOTE — Patient Instructions (Addendum)
 1. Dyspareunia, female (Primary)  - US  Pelvic Complete With Transvaginal; Future - Ambulatory referral to Gynecology  2. Screening for cervical cancer  - Cytology - PAP([place])  3. Screen for STD (sexually transmitted disease)  - NuSwab Vaginitis Plus (VG+)  It is important that you exercise regularly at least 30 minutes 5 times a week as tolerated  Think about what you will eat, plan ahead. Choose  clean, green, fresh or frozen over canned, processed or packaged foods which are more sugary, salty and fatty. 70 to 75% of food eaten should be vegetables and fruit. Three meals at set times with snacks allowed between meals, but they must be fruit or vegetables. Aim to eat over a 12 hour period , example 7 am to 7 pm, and STOP after  your last meal of the day. Drink water,generally about 64 ounces per day, no other drink is as healthy. Fruit juice is best enjoyed in a healthy way, by EATING the fruit.  Thanks for choosing [facility] we consider it a privelige to serve you.

## 2024-07-05 NOTE — Assessment & Plan Note (Signed)
 BP Readings from Last 3 Encounters:  07/05/24 112/82  07/01/24 107/68  05/25/24 137/64   On spironolactone  25 mg daily, torsemide  20 mg daily, carvedilol  3.125 mg twice daily, has Entresto  ordered but not taking medication Discussed DASH diet and dietary sodium restrictions Continue to increase dietary efforts and exercise.

## 2024-07-05 NOTE — Progress Notes (Deleted)
 Established Patient Office Visit  Subjective:  Patient ID: Amber Shaw, female    DOB: [birth year]  Age: 63 y.o. MRN: [id number]  CC:  Chief Complaint  Patient presents with   Gynecologic Exam    Hurts when having intercourse, dry vaginal area     HPI   Discussed the use of AI scribe software for clinical note transcription with the patient, who gave verbal consent to proceed.  History of Present Illness Amber Shaw is a 63 year old female  has a past medical history of Acute combined systolic and diastolic heart failure (HCC) (96/86/7974), Arthritis, Asthma, Combined systolic and diastolic congestive heart failure (HCC) (11/05/2023), COPD (chronic obstructive pulmonary disease) (HCC), Hypertension, and Sleep apnea. who presents with postcoital bleeding and pain.  She is overdue for a Pap smear, having missed her last scheduled appointment in October due to being away. Her last known Pap smear was in December 2017.  She experiences postcoital pain and bleeding once or twice every three months for several years. The pain is described as very painful, with light red bleeding that persists until the next day. She has attempted to use an expensive lubricant without relief.  She reports that she is taking her medications as prescribed.   Assessment & Plan     Past Medical History:  Diagnosis Date   Acute combined systolic and diastolic heart failure (HCC) 11/05/2023   Arthritis    Asthma    Combined systolic and diastolic congestive heart failure (HCC) 11/05/2023   COPD (chronic obstructive pulmonary disease) (HCC)    Hypertension    Sleep apnea     Past Surgical History:  Procedure Laterality Date   no prior surgery     RIGHT/LEFT HEART CATH AND CORONARY ANGIOGRAPHY N/A 11/09/2023   Procedure: RIGHT/LEFT HEART CATH AND CORONARY ANGIOGRAPHY;  Surgeon: Mady Bruckner, MD;  Location: [facility];  Service: Cardiovascular;  Laterality: N/A;    Family History   Problem Relation Age of Onset   Cancer Mother    Hypertension Father    Heart disease Father        Rare heart disease  Died age 60   Colon cancer Maternal Grandfather 74   Cancer Other    Diabetes Other    Hyperlipidemia Other    Stroke Other     Social History   Socioeconomic History   Marital status: Single    Spouse name: Not on file   Number of children: 2   Years of education: Not on file   Highest education level: 12th grade  Occupational History   Occupation: Not working  Tobacco Use   Smoking status: Former    Current packs/day: 0.00    Average packs/day: 0.3 packs/day for 18.0 years (4.5 ttl pk-yrs)    Types: Cigarettes    Start date: 11/12/1996    Quit date: 11/13/2014    Years since quitting: 9.6   Smokeless tobacco: Never  Vaping Use   Vaping status: Former   Substances: Flavoring  Substance and Sexual Activity   Alcohol use: No    Alcohol/week: 0.0 standard drinks of alcohol   Drug use: No   Sexual activity: Not on file  Other Topics Concern   Not on file  Social History Narrative   Lives alone.     Two children.     Social Drivers of Health   Financial Resource Strain: High Risk (07/01/2024)   Overall Financial Resource Strain (CARDIA)    Difficulty of Paying  Living Expenses: Hard  Food Insecurity: Food Insecurity Present (07/01/2024)   Hunger Vital Sign    Worried About Running Out of Food in the Last Year: Sometimes true    Ran Out of Food in the Last Year: Sometimes true  Transportation Needs: Unmet Transportation Needs (07/01/2024)   PRAPARE - Administrator, Civil Service (Medical): Yes    Lack of Transportation (Non-Medical): No  Physical Activity: Insufficiently Active (07/01/2024)   Exercise Vital Sign    Days of Exercise per Week: 4 days    Minutes of Exercise per Session: 30 min  Stress: Stress Concern Present (07/01/2024)   [employer] of Occupational Health - Occupational Stress Questionnaire    Feeling of Stress:  To some extent  Social Connections: Unknown (07/01/2024)   Social Connection and Isolation Panel    Frequency of Communication with Friends and Family: More than three times a week    Frequency of Social Gatherings with Friends and Family: Once a week    Attends Religious Services: Patient declined    Database Administrator or Organizations: No    Attends Engineer, Structural: Not on file    Marital Status: Patient declined  Intimate Partner Violence: Not At Risk (11/05/2023)   Humiliation, Afraid, Rape, and Kick questionnaire    Fear of Current or Ex-Partner: No    Emotionally Abused: No    Physically Abused: No    Sexually Abused: No    Outpatient Medications Prior to Visit  Medication Sig Dispense Refill   albuterol  (VENTOLIN  HFA) 108 (90 Base) MCG/ACT inhaler Inhale 2 puffs into the lungs every 6 (six) hours as needed for wheezing or shortness of breath. 18 g 0   carvedilol  (COREG ) 3.125 MG tablet Take 1 tablet (3.125 mg total) by mouth 2 (two) times daily with a meal. 180 tablet 3   fluticasone -salmeterol (ADVAIR  DISKUS) 250-50 MCG/ACT AEPB Inhale 1 puff into the lungs in the morning and at bedtime. 60 each 3   ibuprofen  (ADVIL ) 800 MG tablet Take 1 tablet (800 mg total) by mouth every 8 (eight) hours as needed (pain). Take with food to avoid stomach upset. Do not take any additional NSAIDs while on this. You may take tylenol  in addition to this if needed for extra pain relief. 21 tablet 0   JARDIANCE  10 MG TABS tablet Take 1 tablet (10 mg total) by mouth daily before breakfast. 30 tablet 11   Magnesium  400 MG TABS Take 400 mg ( 1 tablet)  by mouth daily. Magnesium  TAURATE - patient aware she may have to purchase this online 90 tablet 3   mexiletine (MEXITIL ) 150 MG capsule Take 1 capsule (150 mg total) by mouth 2 (two) times daily. 60 capsule 11   omeprazole  (PRILOSEC) 40 MG capsule Take 1 capsule (40 mg total) by mouth daily. 30 capsule 3   rosuvastatin (CRESTOR) 10 MG tablet  Take 1 tablet (10 mg total) by mouth daily. 60 tablet 1   sacubitril -valsartan  (ENTRESTO ) 24-26 MG Take 1 tablet by mouth 2 (two) times daily. 60 tablet 11   spironolactone  (ALDACTONE ) 25 MG tablet Take 1 tablet (25 mg total) by mouth daily. 30 tablet 5   torsemide  (DEMADEX ) 20 MG tablet Take 1 tablet (20 mg total) by mouth daily. 90 tablet 3   brompheniramine-pseudoephedrine-DM 30-2-10 MG/5ML syrup Take 10 mLs by mouth every 6 (six) hours as needed (cough and congestion). (Patient not taking: Reported on 07/05/2024) 120 mL 0   No facility-administered medications  prior to visit.    Allergies  Allergen Reactions   Percocet [Oxycodone-Acetaminophen ] Other (See Comments)    Painful urination     ROS Review of Systems  Constitutional:  Negative for appetite change, chills, fatigue and fever.  HENT:  Negative for congestion, postnasal drip, rhinorrhea and sneezing.   Respiratory:  Negative for cough, shortness of breath and wheezing.   Cardiovascular:  Negative for chest pain, palpitations and leg swelling.  Gastrointestinal:  Negative for abdominal pain, constipation, nausea and vomiting.  Genitourinary:  Positive for dyspareunia. Negative for difficulty urinating, dysuria, flank pain and frequency.  Musculoskeletal:  Negative for arthralgias, back pain, joint swelling and myalgias.  Skin:  Negative for color change, pallor, rash and wound.  Neurological:  Negative for dizziness, facial asymmetry, weakness, numbness and headaches.  Psychiatric/Behavioral:  Negative for behavioral problems, confusion, self-injury and suicidal ideas.       Objective:    Physical Exam Vitals and nursing note reviewed. Exam conducted with a chaperone present.  Constitutional:      General: She is not in acute distress.    Appearance: Normal appearance. She is not ill-appearing, toxic-appearing or diaphoretic.  Eyes:     General: No scleral icterus.       Right eye: No discharge.        Left eye: No  discharge.     Extraocular Movements: Extraocular movements intact.     Conjunctiva/sclera: Conjunctivae normal.  Neck:     Vascular: No carotid bruit.  Cardiovascular:     Rate and Rhythm: Normal rate and regular rhythm.     Pulses: Normal pulses.     Heart sounds: Normal heart sounds. No murmur heard.    No friction rub. No gallop.  Pulmonary:     Effort: Pulmonary effort is normal. No respiratory distress.     Breath sounds: Normal breath sounds. No stridor. No wheezing, rhonchi or rales.  Chest:     Chest wall: No mass, lacerations, deformity, swelling, tenderness or edema.  Breasts:    Tanner Score is 5.     Breasts are symmetrical.     Right: Normal. No swelling, bleeding, inverted nipple, mass, nipple discharge, skin change or tenderness.     Left: Normal. No swelling, bleeding, inverted nipple, mass, nipple discharge, skin change or tenderness.  Abdominal:     General: Bowel sounds are normal. There is no distension.     Palpations: Abdomen is soft. There is no mass.     Tenderness: There is no abdominal tenderness. There is no right CVA tenderness, left CVA tenderness, guarding or rebound.     Hernia: No hernia is present. There is no hernia in the left inguinal area or right inguinal area.  Genitourinary:    General: Normal vulva.     Exam position: Lithotomy position.     Pubic Area: No rash or pubic lice.      Tanner stage (genital): 5.     Labia:        Right: No rash, tenderness, lesion or injury.        Left: No rash, tenderness, lesion or injury.      Urethra: No prolapse, urethral pain, urethral swelling or urethral lesion.     Vagina: No signs of injury and foreign body. No vaginal discharge, erythema, tenderness, bleeding, lesions or prolapsed vaginal walls.     Cervix: No cervical motion tenderness, discharge, friability, lesion, erythema, cervical bleeding or eversion.     Uterus: Normal. Not enlarged, not  fixed, not tender and no uterine prolapse.       Adnexa:        Right: No mass, tenderness or fullness.         Left: No mass, tenderness or fullness.    Musculoskeletal:        General: No swelling, tenderness, deformity or signs of injury.     Cervical back: Normal range of motion and neck supple. No rigidity or tenderness.     Right lower leg: No edema.     Left lower leg: No edema.  Lymphadenopathy:     Cervical: No cervical adenopathy.     Upper Body:     Right upper body: No supraclavicular, axillary or pectoral adenopathy.     Left upper body: No supraclavicular, axillary or pectoral adenopathy.     Lower Body: No right inguinal adenopathy. No left inguinal adenopathy.  Skin:    General: Skin is warm and dry.     Capillary Refill: Capillary refill takes less than 2 seconds.     Coloration: Skin is not jaundiced or pale.     Findings: No bruising, erythema, lesion or rash.  Neurological:     Mental Status: She is alert and oriented to person, place, and time.     Cranial Nerves: No cranial nerve deficit.     Motor: No weakness.     Gait: Gait normal.  Psychiatric:        Mood and Affect: Mood normal.        Behavior: Behavior normal.        Thought Content: Thought content normal.        Judgment: Judgment normal.     BP 112/82 (BP Location: Right Arm, Patient Position: Sitting, Cuff Size: Normal)   Pulse 68   Wt 165 lb (74.8 kg)   LMP 05/16/2013   SpO2 98%   BMI 27.46 kg/m  Wt Readings from Last 3 Encounters:  07/05/24 165 lb (74.8 kg)  07/01/24 161 lb (73 kg)  02/09/24 161 lb 8 oz (73.3 kg)    Lab Results  Component Value Date   TSH 0.850 11/07/2023   Lab Results  Component Value Date   WBC 4.3 11/10/2023   HGB 15.2 (H) 11/10/2023   HCT 47.5 (H) 11/10/2023   MCV 92.2 11/10/2023   PLT 237 11/10/2023   Lab Results  Component Value Date   NA 146 (H) 02/08/2024   K 4.3 02/08/2024   CO2 32 02/08/2024   GLUCOSE 164 (H) 02/08/2024   BUN 16 02/08/2024   CREATININE 1.16 (H) 02/08/2024   BILITOT 1.0  11/08/2023   ALKPHOS 58 11/08/2023   AST 17 11/08/2023   ALT 44 11/08/2023   PROT 6.6 11/08/2023   ALBUMIN 3.4 (L) 11/08/2023   CALCIUM  9.5 02/08/2024   ANIONGAP 14 02/08/2024   Lab Results  Component Value Date   CHOL 194 12/29/2023   Lab Results  Component Value Date   HDL 62 12/29/2023   Lab Results  Component Value Date   LDLCALC 112 (H) 12/29/2023   Lab Results  Component Value Date   TRIG 111 12/29/2023   Lab Results  Component Value Date   CHOLHDL 3.1 12/29/2023   Lab Results  Component Value Date   HGBA1C 5.8 (H) 11/06/2023      Assessment & Plan:   Problem List Items Addressed This Visit       Cardiovascular and Mediastinum   Essential hypertension, benign   BP Readings from  Last 3 Encounters:  07/05/24 112/82  07/01/24 107/68  05/25/24 137/64   On spironolactone  25 mg daily, torsemide  20 mg daily, carvedilol  3.125 mg twice daily, has Entresto  ordered but not taking medication Discussed DASH diet and dietary sodium restrictions Continue to increase dietary efforts and exercise.         Other   Screen for STD (sexually transmitted disease)   Relevant Orders   NuSwab Vaginitis Plus (VG+)   Screening for cervical cancer   Relevant Orders   Cytology - PAP([place])   Dyspareunia, female - Primary   Postcoital bleeding and dyspareunia Intermittent postcoital bleeding and dyspareunia for years. - Screened for STDs. - Referred to gynecologist for further evaluation. Pelvic ultrasound ordered        Relevant Orders   US  Pelvic Complete With Transvaginal   Ambulatory referral to Gynecology    No orders of the defined types were placed in this encounter.   Follow-up: No follow-ups on file.    Vanessa Kampf R Eran Mistry, FNP

## 2024-07-05 NOTE — Progress Notes (Signed)
 Established Patient Office Visit  Subjective:  Patient ID: Amber Shaw, female    DOB: Sep 12, 1960  Age: 63 y.o. MRN: [id number]  CC:  Chief Complaint  Patient presents with   Gynecologic Exam    Hurts when having intercourse, dry vaginal area     HPI   Discussed the use of AI scribe software for clinical note transcription with the patient, who gave verbal consent to proceed.  History of Present Illness Amber Shaw is a 63 year old female who presents with postcoital bleeding and pain.  She is overdue for a Pap smear, having missed her last scheduled appointment in October due to being away. Her last Pap smear was in December 2017.  She experiences postcoital pain and bleeding once or twice every three months for several years. The pain is described as very painful, with light red bleeding that persists until the next day. She has attempted to use an expensive lubricant without relief.  She reports that she is taking her medications as prescribed.   Assessment and Plan Assessment & Plan General gynecologic screening (Pap smear) Pap smear overdue since December 2017. Screening needed for cervical abnormalities. - Performed Pap smear today.  Postcoital bleeding and dyspareunia Intermittent postcoital bleeding and dyspareunia for years. - Screened for STDs. - Referred to gynecologist for further evaluation.  Essential hypertension, benign Blood pressure well-controlled with current medication. - Continue current antihypertensive medications.      Past Medical History:  Diagnosis Date   Acute combined systolic and diastolic heart failure (HCC) 11/05/2023   Arthritis    Asthma    Combined systolic and diastolic congestive heart failure (HCC) 11/05/2023   COPD (chronic obstructive pulmonary disease) (HCC)    Hypertension    Sleep apnea     Past Surgical History:  Procedure Laterality Date   no prior surgery     RIGHT/LEFT HEART CATH AND CORONARY ANGIOGRAPHY  N/A 11/09/2023   Procedure: RIGHT/LEFT HEART CATH AND CORONARY ANGIOGRAPHY;  Surgeon: Mady Bruckner, MD;  Location: [facility];  Service: Cardiovascular;  Laterality: N/A;    Family History  Problem Relation Age of Onset   Cancer Mother    Hypertension Father    Heart disease Father        Rare heart disease  Died age 5   Colon cancer Maternal Grandfather 61   Cancer Other    Diabetes Other    Hyperlipidemia Other    Stroke Other     Social History   Socioeconomic History   Marital status: Single    Spouse name: Not on file   Number of children: 2   Years of education: Not on file   Highest education level: 12th grade  Occupational History   Occupation: Not working  Tobacco Use   Smoking status: Former    Current packs/day: 0.00    Average packs/day: 0.3 packs/day for 18.0 years (4.5 ttl pk-yrs)    Types: Cigarettes    Start date: 11/12/1996    Quit date: 11/13/2014    Years since quitting: 9.6   Smokeless tobacco: Never  Vaping Use   Vaping status: Former   Substances: Flavoring  Substance and Sexual Activity   Alcohol use: No    Alcohol/week: 0.0 standard drinks of alcohol   Drug use: No   Sexual activity: Not on file  Other Topics Concern   Not on file  Social History Narrative   Lives alone.     Two children.  Social Drivers of Health   Financial Resource Strain: High Risk (07/01/2024)   Overall Financial Resource Strain (CARDIA)    Difficulty of Paying Living Expenses: Hard  Food Insecurity: Food Insecurity Present (07/01/2024)   Hunger Vital Sign    Worried About Running Out of Food in the Last Year: Sometimes true    Ran Out of Food in the Last Year: Sometimes true  Transportation Needs: Unmet Transportation Needs (07/01/2024)   PRAPARE - Administrator, Civil Service (Medical): Yes    Lack of Transportation (Non-Medical): No  Physical Activity: Insufficiently Active (07/01/2024)   Exercise Vital Sign    Days of Exercise per  Week: 4 days    Minutes of Exercise per Session: 30 min  Stress: Stress Concern Present (07/01/2024)   [employer] of Occupational Health - Occupational Stress Questionnaire    Feeling of Stress: To some extent  Social Connections: Unknown (07/01/2024)   Social Connection and Isolation Panel    Frequency of Communication with Friends and Family: More than three times a week    Frequency of Social Gatherings with Friends and Family: Once a week    Attends Religious Services: Patient declined    Database Administrator or Organizations: No    Attends Engineer, Structural: Not on file    Marital Status: Patient declined  Intimate Partner Violence: Not At Risk (11/05/2023)   Humiliation, Afraid, Rape, and Kick questionnaire    Fear of Current or Ex-Partner: No    Emotionally Abused: No    Physically Abused: No    Sexually Abused: No    Outpatient Medications Prior to Visit  Medication Sig Dispense Refill   albuterol  (VENTOLIN  HFA) 108 (90 Base) MCG/ACT inhaler Inhale 2 puffs into the lungs every 6 (six) hours as needed for wheezing or shortness of breath. 18 g 0   carvedilol  (COREG ) 3.125 MG tablet Take 1 tablet (3.125 mg total) by mouth 2 (two) times daily with a meal. 180 tablet 3   fluticasone -salmeterol (ADVAIR  DISKUS) 250-50 MCG/ACT AEPB Inhale 1 puff into the lungs in the morning and at bedtime. 60 each 3   ibuprofen  (ADVIL ) 800 MG tablet Take 1 tablet (800 mg total) by mouth every 8 (eight) hours as needed (pain). Take with food to avoid stomach upset. Do not take any additional NSAIDs while on this. You may take tylenol  in addition to this if needed for extra pain relief. 21 tablet 0   JARDIANCE  10 MG TABS tablet Take 1 tablet (10 mg total) by mouth daily before breakfast. 30 tablet 11   Magnesium  400 MG TABS Take 400 mg ( 1 tablet)  by mouth daily. Magnesium  TAURATE - patient aware she may have to purchase this online 90 tablet 3   mexiletine (MEXITIL ) 150 MG capsule Take  1 capsule (150 mg total) by mouth 2 (two) times daily. 60 capsule 11   omeprazole  (PRILOSEC) 40 MG capsule Take 1 capsule (40 mg total) by mouth daily. 30 capsule 3   rosuvastatin (CRESTOR) 10 MG tablet Take 1 tablet (10 mg total) by mouth daily. 60 tablet 1   sacubitril -valsartan  (ENTRESTO ) 24-26 MG Take 1 tablet by mouth 2 (two) times daily. 60 tablet 11   spironolactone  (ALDACTONE ) 25 MG tablet Take 1 tablet (25 mg total) by mouth daily. 30 tablet 5   torsemide  (DEMADEX ) 20 MG tablet Take 1 tablet (20 mg total) by mouth daily. 90 tablet 3   brompheniramine-pseudoephedrine-DM 30-2-10 MG/5ML syrup Take 10 mLs  by mouth every 6 (six) hours as needed (cough and congestion). (Patient not taking: Reported on 07/05/2024) 120 mL 0   No facility-administered medications prior to visit.    Allergies  Allergen Reactions   Percocet [Oxycodone-Acetaminophen ] Other (See Comments)    Painful urination     ROS Review of Systems    Objective:    Physical Exam  BP 112/82 (BP Location: Right Arm, Patient Position: Sitting, Cuff Size: Normal)   Pulse 68   Wt 165 lb (74.8 kg)   LMP 05/16/2013   SpO2 98%   BMI 27.46 kg/m  Wt Readings from Last 3 Encounters:  07/05/24 165 lb (74.8 kg)  07/01/24 161 lb (73 kg)  02/09/24 161 lb 8 oz (73.3 kg)    Lab Results  Component Value Date   TSH 0.850 11/07/2023   Lab Results  Component Value Date   WBC 4.3 11/10/2023   HGB 15.2 (H) 11/10/2023   HCT 47.5 (H) 11/10/2023   MCV 92.2 11/10/2023   PLT 237 11/10/2023   Lab Results  Component Value Date   NA 146 (H) 02/08/2024   K 4.3 02/08/2024   CO2 32 02/08/2024   GLUCOSE 164 (H) 02/08/2024   BUN 16 02/08/2024   CREATININE 1.16 (H) 02/08/2024   BILITOT 1.0 11/08/2023   ALKPHOS 58 11/08/2023   AST 17 11/08/2023   ALT 44 11/08/2023   PROT 6.6 11/08/2023   ALBUMIN 3.4 (L) 11/08/2023   CALCIUM  9.5 02/08/2024   ANIONGAP 14 02/08/2024   Lab Results  Component Value Date   CHOL 194 12/29/2023    Lab Results  Component Value Date   HDL 62 12/29/2023   Lab Results  Component Value Date   LDLCALC 112 (H) 12/29/2023   Lab Results  Component Value Date   TRIG 111 12/29/2023   Lab Results  Component Value Date   CHOLHDL 3.1 12/29/2023   Lab Results  Component Value Date   HGBA1C 5.8 (H) 11/06/2023      Assessment & Plan:   Problem List Items Addressed This Visit       Cardiovascular and Mediastinum   Essential hypertension, benign   BP Readings from Last 3 Encounters:  07/05/24 112/82  07/01/24 107/68  05/25/24 137/64   On spironolactone  25 mg daily, torsemide  20 mg daily, carvedilol  3.125 mg twice daily, has Entresto  ordered but not taking medication Discussed DASH diet and dietary sodium restrictions Continue to increase dietary efforts and exercise.         Other   Screen for STD (sexually transmitted disease)   Relevant Orders   NuSwab Vaginitis Plus (VG+)   Screening for cervical cancer   Relevant Orders   Cytology - PAP([place])   Dyspareunia, female - Primary   Postcoital bleeding and dyspareunia Intermittent postcoital bleeding and dyspareunia for years. - Screened for STDs. - Referred to gynecologist for further evaluation. Pelvic ultrasound ordered        Relevant Orders   US  Pelvic Complete With Transvaginal   Ambulatory referral to Gynecology    No orders of the defined types were placed in this encounter.   Follow-up: No follow-ups on file.    Shelva Hetzer R Pablo Stauffer, FNP

## 2024-07-06 ENCOUNTER — Other Ambulatory Visit: Payer: Self-pay

## 2024-07-06 ENCOUNTER — Ambulatory Visit: Payer: Self-pay | Admitting: Nurse Practitioner

## 2024-07-06 LAB — LIPID PANEL
Chol/HDL Ratio: 3.9 ratio (ref 0.0–4.4)
Cholesterol, Total: 231 mg/dL — ABNORMAL HIGH (ref 100–199)
HDL: 59 mg/dL (ref >39)
LDL Chol Calc (NIH): 146 mg/dL — ABNORMAL HIGH (ref 0–99)
Triglycerides: 144 mg/dL (ref 0–149)
VLDL Cholesterol Cal: 26 mg/dL (ref 5–40)

## 2024-07-06 LAB — HEMOGLOBIN A1C
Est. average glucose Bld gHb Est-mCnc: 120 mg/dL
Hgb A1c MFr Bld: 5.8 % — ABNORMAL HIGH (ref 4.8–5.6)

## 2024-07-06 LAB — BASIC METABOLIC PANEL WITH GFR
BUN/Creatinine Ratio: 19 (ref 12–28)
BUN: 14 mg/dL (ref 8–27)
CO2: 29 mmol/L (ref 20–29)
Calcium: 9.5 mg/dL (ref 8.7–10.3)
Chloride: 102 mmol/L (ref 96–106)
Creatinine, Ser: 0.73 mg/dL (ref 0.57–1.00)
Glucose: 78 mg/dL (ref 70–99)
Potassium: 4.2 mmol/L (ref 3.5–5.2)
Sodium: 143 mmol/L (ref 134–144)
eGFR: 94 mL/min/1.73 (ref >59)

## 2024-07-06 LAB — MAGNESIUM: Magnesium: 2.2 mg/dL (ref 1.6–2.3)

## 2024-07-07 ENCOUNTER — Other Ambulatory Visit: Payer: Self-pay

## 2024-07-07 LAB — NUSWAB VAGINITIS PLUS (VG+)
Candida albicans, NAA: NEGATIVE
Candida glabrata, NAA: NEGATIVE
Chlamydia trachomatis, NAA: NEGATIVE
Neisseria gonorrhoeae, NAA: NEGATIVE
Trich vag by NAA: NEGATIVE

## 2024-07-08 ENCOUNTER — Ambulatory Visit: Payer: Self-pay | Admitting: Nurse Practitioner

## 2024-07-11 LAB — CYTOLOGY - PAP
Comment: NEGATIVE
Diagnosis: NEGATIVE
High risk HPV: NEGATIVE

## 2024-07-11 NOTE — Addendum Note (Signed)
 Addended by: JUANICE THOMES SAUNDERS on: 07/11/2024 07:21 PM   Modules accepted: Orders

## 2024-07-15 ENCOUNTER — Ambulatory Visit: Attending: Cardiovascular Disease | Admitting: Cardiovascular Disease

## 2024-07-15 ENCOUNTER — Ambulatory Visit
Admission: RE | Admit: 2024-07-15 | Discharge: 2024-07-15 | Disposition: A | Discharge status: Home / Self Care | Source: Ambulatory Visit | Attending: Nurse Practitioner | Admitting: Nurse Practitioner

## 2024-07-15 ENCOUNTER — Encounter: Payer: Self-pay | Admitting: Cardiovascular Disease

## 2024-07-15 VITALS — BP 110/72 | HR 48 | Ht 65.0 in | Wt 163.3 lb

## 2024-07-15 DIAGNOSIS — I5042 Chronic combined systolic (congestive) and diastolic (congestive) heart failure: Secondary | ICD-10-CM | POA: Insufficient documentation

## 2024-07-15 DIAGNOSIS — N941 Unspecified dyspareunia: Secondary | ICD-10-CM

## 2024-07-15 NOTE — Progress Notes (Signed)
 Electrophysiology Office Note:    Date:  07/15/2024   ID:  Amber Shaw, DOB 1960-11-22, MRN [id number]  PCP:  Paseda, Folashade R, FNP   [facility] Providers Cardiologist:  Lynwood Schilling, MD Electrophysiologist:  Eulas FORBES Furbish, MD  Advanced Heart Failure:  Ria Commander, DO     Referring MD: Paseda, Folashade R, FNP   History of Present Illness:    Amber Shaw is a 63 y.o. female with a medical history significant for frequent PVCs, heart failure with reduced ejection fraction, hypertension, hypokalemia, referred for rhythm management.     She was diagnosed with cardiomyopathy while admitted to [facility] long with shortness of breath in 2018.  LVEF at that time was 30% coronary angiogram showed known nonobstructive coronary disease.  She was noted to have frequent PVCs at this time.   History of Present Illness Amber Shaw is a 63 year old female with heart failure and abnormal heart rhythms who presents for evaluation of abnormal heartbeats.  In 2018, she initially presented with symptoms of shortness of breath and fluid retention. A heart catheterization revealed a heart function of approximately 30%. She has been managing her condition with a diuretic, which has helped control the fluid retention.  She has been experiencing abnormal heartbeats, which have been noted as having two different morphologies on her EKGs. These beats occur frequently, although she does not feel them. She is currently taking Entresto  and Jardiance , but these medications have not effectively addressed the abnormal beats.  She reports a family history of heart issues, noting that two of her aunts are on the same medication regimen. One aunt experienced significant fluid retention, resulting in heart damage and a weight loss of 81 pounds after treatment.  She recalls being given large potassium pills in the past and expresses relief at avoiding potassium IV treatment, which her brother  found painful.           Today, she reports she feels well and has no complaints.  EKGs/Labs/Other Studies Reviewed Today:     Echocardiogram:  TTE November 06, 2023 LVEF 30%.  Mild concentric LVH.  Normal RV size.  Left atrium is moderately dilated.   Monitors:  14 day monitor March 2025-- my interpretation Sinus rhythm 47 to 133 bpm, average 75 23.4% PVCs.  Multiple PVC morphologies were seen, but there was 1 dominant morphology comprising 22.9% of the 23.4%  There were episodes of wide-complex tachycardia up to 5.4 seconds in duration  Advanced imaging:  Cardiac MRI Jan 21, 2024 Difficult images due to frequent PVCs.  Diffuse LV hypokinesis, EF 39%.  RVEF 42%.  Mid wall LGE stripe in the basal septum.  Cardiac catherization  Coronary angiogram March 2025 Mild, nonobstructive coronary disease.  Frequent PVCs noted  EKG:         Physical Exam:    VS:  BP 110/72   Pulse (!) 48   Ht 5' 5 (1.651 m)   Wt 163 lb 4.8 oz (74.1 kg)   LMP 05/16/2013   SpO2 98%   BMI 27.17 kg/m     Wt Readings from Last 3 Encounters:  07/15/24 163 lb 4.8 oz (74.1 kg)  07/05/24 165 lb (74.8 kg)  07/01/24 161 lb (73 kg)     GEN: Well nourished, well developed in no acute distress CARDIAC: RRR, no murmurs, rubs, gallops. Extrasystoles noted RESPIRATORY:  Normal work of breathing MUSCULOSKELETAL: no edema    ASSESSMENT & PLAN:     Frequent PVCs On review  of the ECGs, there are at least 3 morphologies Poor ablation candidate due to multiple morphologies Continue mexiletine 150 mg twice daily Continue magnesium  taurate  CHF with reduced ejection fraction LVEF slightly improved on MRI Appears well compensated. Continue carvedilol  3.125, Jardiance  10 mg, Entresto  24-26, Aldactone  25 mg  Hypertension Diastolic slightly above goal today  Ectropic atrial rhythm Noted on ECG 02/03/2024, 02/09/2024  Hypokalemia Will need to maintain K+ > 4    Signed, Eulas FORBES Furbish,  MD  07/15/2024 11:34 AM    [facility]

## 2024-07-15 NOTE — Patient Instructions (Signed)
 Medication Instructions:  Your physician recommends that you continue on your current medications as directed. Please refer to the Current Medication list given to you today.  *If you need a refill on your cardiac medications before your next appointment, please call your pharmacy*  Lab Work: None ordered.  If you have labs (blood work) drawn today and your tests are completely normal, you will receive your results only by: MyChart Message (if you have MyChart) OR A paper copy in the mail If you have any lab test that is abnormal or we need to change your treatment, we will call you to review the results.  Testing/Procedures: None ordered.   Follow-Up: At [facility], you and your health needs are our priority.  As part of our continuing mission to provide you with exceptional heart care, our providers are all part of one team.  This team includes your primary Cardiologist (physician) and Advanced Practice Providers or APPs (Physician Assistants and Nurse Practitioners) who all work together to provide you with the care you need, when you need it.  Your next appointment:   12 months with EP APP

## 2024-07-18 ENCOUNTER — Encounter (HOSPITAL_COMMUNITY): Admitting: Cardiology

## 2024-07-18 ENCOUNTER — Ambulatory Visit (HOSPITAL_COMMUNITY)

## 2024-08-05 DIAGNOSIS — Z419 Encounter for procedure for purposes other than remedying health state, unspecified: Secondary | ICD-10-CM | POA: Diagnosis not present

## 2024-08-12 ENCOUNTER — Telehealth (HOSPITAL_COMMUNITY): Payer: Self-pay

## 2024-08-12 NOTE — Telephone Encounter (Signed)
 Called to confirm/remind patient of their appointment at the Advanced Heart Failure Clinic on 08/15/24.   Appointment:   [x] Confirmed  [] Left mess   [] No answer/No voice mail  [] VM Full/unable to leave message  [] Phone not in service  Patient reminded to bring all medications and/or complete list.  Confirmed patient has transportation. Gave directions, instructed to utilize valet parking.

## 2024-08-15 ENCOUNTER — Encounter (HOSPITAL_COMMUNITY): Payer: Self-pay

## 2024-08-15 ENCOUNTER — Ambulatory Visit (HOSPITAL_COMMUNITY)
Admission: RE | Admit: 2024-08-15 | Discharge: 2024-08-15 | Disposition: A | Discharge status: Home / Self Care | Source: Ambulatory Visit | Attending: Adult Health | Admitting: Adult Health

## 2024-08-15 ENCOUNTER — Ambulatory Visit (HOSPITAL_BASED_OUTPATIENT_CLINIC_OR_DEPARTMENT_OTHER)
Admission: RE | Admit: 2024-08-15 | Discharge: 2024-08-15 | Disposition: A | Discharge status: Home / Self Care | Source: Ambulatory Visit | Attending: Cardiology | Admitting: Cardiology

## 2024-08-15 VITALS — BP 100/60 | HR 58 | Wt 158.6 lb

## 2024-08-15 DIAGNOSIS — K219 Gastro-esophageal reflux disease without esophagitis: Secondary | ICD-10-CM | POA: Insufficient documentation

## 2024-08-15 DIAGNOSIS — Z87891 Personal history of nicotine dependence: Secondary | ICD-10-CM | POA: Diagnosis not present

## 2024-08-15 DIAGNOSIS — I351 Nonrheumatic aortic (valve) insufficiency: Secondary | ICD-10-CM | POA: Insufficient documentation

## 2024-08-15 DIAGNOSIS — E785 Hyperlipidemia, unspecified: Secondary | ICD-10-CM | POA: Insufficient documentation

## 2024-08-15 DIAGNOSIS — I5022 Chronic systolic (congestive) heart failure: Secondary | ICD-10-CM | POA: Insufficient documentation

## 2024-08-15 DIAGNOSIS — R7303 Prediabetes: Secondary | ICD-10-CM | POA: Diagnosis not present

## 2024-08-15 DIAGNOSIS — I5042 Chronic combined systolic (congestive) and diastolic (congestive) heart failure: Secondary | ICD-10-CM

## 2024-08-15 DIAGNOSIS — Z8241 Family history of sudden cardiac death: Secondary | ICD-10-CM | POA: Insufficient documentation

## 2024-08-15 DIAGNOSIS — I2510 Atherosclerotic heart disease of native coronary artery without angina pectoris: Secondary | ICD-10-CM | POA: Diagnosis not present

## 2024-08-15 DIAGNOSIS — J449 Chronic obstructive pulmonary disease, unspecified: Secondary | ICD-10-CM | POA: Insufficient documentation

## 2024-08-15 DIAGNOSIS — I110 Hypertensive heart disease with heart failure: Secondary | ICD-10-CM | POA: Insufficient documentation

## 2024-08-15 DIAGNOSIS — I428 Other cardiomyopathies: Secondary | ICD-10-CM | POA: Insufficient documentation

## 2024-08-15 DIAGNOSIS — Z8249 Family history of ischemic heart disease and other diseases of the circulatory system: Secondary | ICD-10-CM | POA: Diagnosis not present

## 2024-08-15 DIAGNOSIS — K5900 Constipation, unspecified: Secondary | ICD-10-CM | POA: Insufficient documentation

## 2024-08-15 DIAGNOSIS — Z79899 Other long term (current) drug therapy: Secondary | ICD-10-CM | POA: Insufficient documentation

## 2024-08-15 DIAGNOSIS — G4733 Obstructive sleep apnea (adult) (pediatric): Secondary | ICD-10-CM | POA: Diagnosis not present

## 2024-08-15 DIAGNOSIS — I10 Essential (primary) hypertension: Secondary | ICD-10-CM

## 2024-08-15 DIAGNOSIS — I493 Ventricular premature depolarization: Secondary | ICD-10-CM | POA: Insufficient documentation

## 2024-08-15 LAB — ECHOCARDIOGRAM COMPLETE
AR max vel: 2.12 cm2
AV Area VTI: 2.33 cm2
AV Area mean vel: 2.19 cm2
AV Mean grad: 6 mmHg
AV Peak grad: 13 mmHg
Ao pk vel: 1.8 m/s
Area-P 1/2: 2.75 cm2
Calc EF: 39.1 %
S' Lateral: 4.2 cm
Single Plane A2C EF: 35.2 %
Single Plane A4C EF: 41.4 %

## 2024-08-15 MED ORDER — SPIRONOLACTONE 25 MG PO TABS: 12.5000 mg | ORAL_TABLET | Freq: Every day | ORAL | Status: AC

## 2024-08-15 NOTE — Patient Instructions (Signed)
 Medication Changes:  START SPIRONOLACTONE  12.5MG  ONCE DAILY   Follow-Up in: 4 WEEKS WITH PHARMACY---PLEASE BRING ALL OF YOUR MEDICATION BOTTLES WITH YOU TO THIS APPOINTMENT   THEN 8 WEEKS WITH MD AS SCHEDULED   At the Advanced Heart Failure Clinic, you and your health needs are our priority. We have a designated team specialized in the treatment of Heart Failure. This Care Team includes your primary Heart Failure Specialized Cardiologist (physician), Advanced Practice Providers (APPs- Physician Assistants and Nurse Practitioners), and Pharmacist who all work together to provide you with the care you need, when you need it.   You may see any of the following providers on your designated Care Team at your next follow up:  Dr. Toribio Fuel Dr. Ezra Shuck Dr. Odis Brownie Greig Mosses, NP Caffie Shed, [place] [facility] [place], [place] Beckey Coe, NP Jordan Lee, NP Tinnie Redman, PharmD   Please be sure to bring in all your medications bottles to every appointment.   Need to Contact Us :  If you have any questions or concerns before your next appointment please send us  a message through [place] or call our office at [phone].    TO LEAVE A MESSAGE FOR THE NURSE SELECT OPTION 2, PLEASE LEAVE A MESSAGE INCLUDING: YOUR NAME DATE OF BIRTH CALL BACK NUMBER REASON FOR CALL**this is important as we prioritize the call backs  YOU WILL RECEIVE A CALL BACK THE SAME DAY AS LONG AS YOU CALL BEFORE 4:00 PM

## 2024-08-15 NOTE — Progress Notes (Signed)
" °  Echocardiogram 2D Echocardiogram has been performed.  Koleen KANDICE Popper, RDCS 08/15/2024, 1:32 PM "

## 2024-08-15 NOTE — Progress Notes (Signed)
 "  ADVANCED HEART FAILURE CLINIC NOTE  Referring Physician: Paseda, Folashade R, FNP  Primary Care: Paseda, Folashade R, FNP Primary Cardiologist: Dr. Kate  CC: Heart failure with reduced ejection fraction  HPI: Amber Shaw is a 63 y.o. female with systolic heart failure, COPD, hypertension, obstructive sleep apnea on CPAP, family history of sudden cardiac death (father in 6s, sister in late 36s) presenting today to establish care.  Her cardiac history dates back to March 2018 when she was admitted to [facility] long with shortness of breath; echocardiogram with LVEF of 30% at that time with moderately reduced RV function.  Patient was diuresed and sent to [facility] where right and left heart catheterization demonstrated nonobstructive CAD and right heart cath with normal filling pressures and cardiac index of 2.3 L/min/m.  During admission and procedures patient was noted to have frequent PVCs.  She was seen by EP 07/15/24  for possible PVC ablation. 3 morphologies, poor candidate. She was continued on mexiletine 150 mg twice a day.   Today she returns for HF follow up.Overall feeling fine. Denies SOB/PND/Orthopnea. Able to walk up steps.  Appetite ok. No fever or chills. Taking all medications except crestor , spiro, and entresto . She says she stopped those meds because she felt terrible. She has felt better since stopping those 3 medications.   Pertinent Family & social hx:  Mother: CHF, Sister: MI in her 85s   PHYSICAL EXAM: Vitals:   08/15/24 1404  BP: 100/60  Pulse: (!) 58  SpO2: 98%   Wt Readings from Last 3 Encounters:  08/15/24 71.9 kg (158 lb 9.6 oz)  07/15/24 74.1 kg (163 lb 4.8 oz)  07/05/24 74.8 kg (165 lb)    General:   No resp difficulty Neck: no JVD.  Cor: Regular rate & rhythm.  Lungs: clear Abdomen: soft, nontender, nondistended.  Extremities: no  edema Neuro: alert & oriented x3   DATA REVIEW  ECG: 11/17/23: NSR with frequent PVCs  As per my personal  interpretation  ECHO: 11/06/23: LVEF 30% As per my personal interpretation  CATH: 11/09/2023: Mild, non-obstructive coronary artery disease consistent with nonischemic cardiomyopathy. Normal left heart, right heart, and pulmonary artery pressures. Low normal to mildly reduced Fick cardiac output/index. Frequent PVC's noted throughout procedure.  Cardiac MRI  01/21/2024  -Difficult images due to frequent PVCs. 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  Cardiac MRI -Difficult images due to frequent PVCs. LVEF 39% RV 42%  ending.  Left heart catheterization as above.  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 Danford.   Echo today - LVEF 30-35% RV mildly reduced. We discussed today and will need to slowly restart GDMT. Hard to know which med made her feel poorly.  NYHA class / AHA Stage: Volume status & Diuretics: Torsemide  20 mg daily Vasodilators: Hold off on restarting entresto . Add one thing at a time. BP soft today. Hold off. Beta-Blocker: continue Coreg  3.125 mg twice daily MRA: Restart  Spironolactone  12.5 mg daily.  Cardiometabolic:continue  farxiga 10mg  daily Devices therapies & Valvulopathies she is not interested in ICD at this time. Advanced therapies:Not indicated.   2.  Frequent PVCs - Ziopatch with 23% PVC in the setting of TTN cardiomyopathy - She was seen by EP. PVCs 3 morphologies, poor candidate for ablation.  -Continued on mexitil  150 mg twice a day.   3.  Hypertension - Stable.   4.  GERD - Reports that she has severe reflux  5. Constipation  - Reports that she is having severe constipation.  - Metamucil daily.  - Resolved.   Follow up with pharmacy in 3-4 weeks and 8 weeks. Aksed her to bring all medications to her follow up appointment. I spent 31 minutes reviewing records, interviewing/examining patient, and managing orders.     Chas Axel NP-C 2:14 PM   "

## 2024-08-16 ENCOUNTER — Other Ambulatory Visit: Payer: Self-pay

## 2024-08-29 ENCOUNTER — Telehealth: Payer: Self-pay | Admitting: Nurse Practitioner

## 2024-08-29 NOTE — Telephone Encounter (Signed)
 Called the patient on 08/29/24 to reschedule the appointment they cancelled via MyChart. The V/M was full so no message could be left. Sent the patient a message through MyChart about rescheduling.

## 2024-08-31 ENCOUNTER — Ambulatory Visit: Payer: Self-pay | Admitting: Nurse Practitioner

## 2024-09-06 ENCOUNTER — Encounter: Admitting: Obstetrics & Gynecology

## 2024-09-07 ENCOUNTER — Other Ambulatory Visit (HOSPITAL_COMMUNITY): Payer: Self-pay

## 2024-09-07 NOTE — Progress Notes (Incomplete)
 ***In Progress***    Advanced Heart Failure Clinic Note   Referring Physician: Paseda, Folashade R, FNP  Primary Care: Paseda, Folashade R, FNP Primary Cardiologist: Dr. Kate HF Cardiologist: Dr.     ODETTA:  Amber Shaw is a 64 y.o. female with systolic heart failure, COPD, hypertension, obstructive sleep apnea on CPAP, with family history of sudden cardiac death (father in 1s, sister in late 64s).  Her cardiac history dates back to March 2018 when she was admitted to [facility] with shortness of breath; echocardiogram with LVEF of 30% at that time with moderately reduced RV function.  Patient was diuresed and sent to [facility] where right and left heart catheterization demonstrated nonobstructive CAD and right heart cath with normal filling pressures and cardiac index of 2.3 L/min/m.  During admission and procedures patient was noted to have frequent PVCs.   She was seen by EP 07/15/24  for possible PVC ablation. 3 morphologies, poor candidate. She was continued on mexiletine 150 mg twice a day.    Presented to AHF Clinic for HF follow up 08/15/24. Overall was feeling fine. Denied SOB/PND/Orthopnea. Was able to walk up steps.  Appetite was ok. No fever or chills. Reported taking all medications except crestor , spironolactone , and Entresto . She stated she stopped those medications because she felt terrible. She had felt better since stopping those 3 medications.   Today she returns to HF clinic for pharmacist medication titration. At last visit with APP, spironolactone  12.5 mg daily was restarted.   Shortness of breath/dyspnea on exertion? {YES NO:22349}  Orthopnea/PND? {YES NO:22349} Edema? {YES NO:22349} Lightheadedness/dizziness? {YES NO:22349} Daily weights at home? {YES NO:22349} Blood pressure/heart rate monitoring at home? {YES [id number] Following low-sodium/fluid-restricted diet? {YES NO:22349}  HF Medications: Carvedilol  3.125 mg BID Spironolactone  12.5 mg  daily Jardiance  10 mg daily Torsemide  20 mg daily  Has the patient been experiencing any side effects to the medications prescribed?  {YES NO:22349}  Does the patient have any problems obtaining medications due to transportation or finances?   {YES NO:22349}  Understanding of regimen: {excellent/good/fair/poor:19665} Understanding of indications: {excellent/good/fair/poor:19665} Potential of compliance: {excellent/good/fair/poor:19665} Patient understands to avoid NSAIDs. Patient understands to avoid decongestants.    Pertinent Lab Values: Serum creatinine ***, BUN ***, Potassium ***, Sodium ***, BNP ***, Magnesium  ***, Digoxin ***   Vital Signs: Weight: *** (last clinic weight: ***) Blood pressure: ***  Heart rate: ***   DATA REVIEW   ECG: 11/17/23: NSR with frequent PVCs.   ECHO: 11/06/23: LVEF 30% As per my personal interpretation   CATH: 11/09/2023: Mild, non-obstructive coronary artery disease consistent with nonischemic cardiomyopathy. Normal left heart, right heart, and pulmonary artery pressures. Low normal to mildly reduced Fick cardiac output/index. Frequent PVC's noted throughout procedure.   Cardiac MRI  01/21/2024  -Difficult images due to frequent PVCs. 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  Cardiac MRI -Difficult images due to frequent PVCs. LVEF 39% RV 42%  ending.  Left heart catheterization as above.  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 Danford.   Echo 07/2024 - LVEF 30-35% RV mildly reduced.  NYHA class / AHA Stage: NYHA ***; Plan is to continue restarting GDMT slowly. Hard to know which med made her feel poorly.  Volume status & Diuretics: Continue Torsemide  20 mg daily Vasodilators: Hold off on restarting entresto . Add one thing at a time. BP soft today. Hold off. *** Beta-Blocker: Continue  carvedilol  3.125 mg BID MRA: Continue  Spironolactone  12.5 mg daily.  Cardiometabolic: Continue Jardiance  10 mg daily Devices therapies & Valvulopathies she is not interested in ICD at this time. Advanced therapies:Not indicated.    2.  Frequent PVCs - Ziopatch with 23% PVC in the setting of TTN cardiomyopathy - She was seen by EP. PVCs 3 morphologies, poor candidate for ablation.  -Continue on mexiletine 150 mg twice a day.    3.  Hypertension - Stable. ***   4. GERD - Reports that she has severe reflux   5. Constipation  - Reports that she is having severe constipation.  - Metamucil daily.  - Resolved.     Follow up ***   Tinnie Redman, PharmD, BCPS, BCCP, CPP Heart Failure Clinic Pharmacist [phone]

## 2024-09-21 ENCOUNTER — Ambulatory Visit (HOSPITAL_COMMUNITY)

## 2024-10-04 ENCOUNTER — Ambulatory Visit

## 2024-10-06 ENCOUNTER — Ambulatory Visit (HOSPITAL_COMMUNITY): Admitting: Cardiology
